# Patient Record
Sex: Female | Born: 1985 | Race: Black or African American | Hispanic: No | Marital: Single | Smoking: Former smoker
Health system: Southern US, Community
[De-identification: ages and names within clinical notes are randomized; demographics above are authoritative.]

## PROBLEM LIST (undated history)

## (undated) DIAGNOSIS — J45909 Unspecified asthma, uncomplicated: Secondary | ICD-10-CM

## (undated) DIAGNOSIS — S72001A Fracture of unspecified part of neck of right femur, initial encounter for closed fracture: Secondary | ICD-10-CM

## (undated) HISTORY — PX: NO PAST SURGERIES: SHX2092

---

## 2008-04-05 DIAGNOSIS — S72001A Fracture of unspecified part of neck of right femur, initial encounter for closed fracture: Secondary | ICD-10-CM

## 2008-04-05 HISTORY — DX: Fracture of unspecified part of neck of right femur, initial encounter for closed fracture: S72.001A

## 2012-05-01 ENCOUNTER — Encounter (HOSPITAL_COMMUNITY): Payer: Self-pay | Admitting: *Deleted

## 2012-05-01 ENCOUNTER — Emergency Department (HOSPITAL_COMMUNITY)
Admission: EM | Admit: 2012-05-01 | Discharge: 2012-05-01 | Disposition: A | Discharge status: Home / Self Care | Payer: Non-veteran care | Attending: Emergency Medicine | Admitting: Emergency Medicine

## 2012-05-01 DIAGNOSIS — M2550 Pain in unspecified joint: Secondary | ICD-10-CM | POA: Insufficient documentation

## 2012-05-01 DIAGNOSIS — S79919A Unspecified injury of unspecified hip, initial encounter: Secondary | ICD-10-CM | POA: Insufficient documentation

## 2012-05-01 DIAGNOSIS — J45909 Unspecified asthma, uncomplicated: Secondary | ICD-10-CM | POA: Insufficient documentation

## 2012-05-01 DIAGNOSIS — Z87891 Personal history of nicotine dependence: Secondary | ICD-10-CM | POA: Insufficient documentation

## 2012-05-01 DIAGNOSIS — Y9389 Activity, other specified: Secondary | ICD-10-CM | POA: Insufficient documentation

## 2012-05-01 DIAGNOSIS — M7918 Myalgia, other site: Secondary | ICD-10-CM

## 2012-05-01 HISTORY — DX: Unspecified asthma, uncomplicated: J45.909

## 2012-05-01 MED ORDER — IBUPROFEN 400 MG PO TABS
400.0000 mg | ORAL_TABLET | Freq: Once | ORAL | Status: AC
Start: 2012-05-01 — End: 2012-05-01
  Administered 2012-05-01: 400 mg via ORAL
  Filled 2012-05-01: qty 1

## 2012-05-01 NOTE — ED Notes (Signed)
Pt  Waiting in ED waiting room for cab voucher to assist in getting the pt & pt partner's children home
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
## 2012-05-01 NOTE — ED Provider Notes (Signed)
Medical screening examination/treatment/procedure(s) were performed by non-physician practitioner and as supervising physician I was immediately available for consultation/collaboration.   Rhealynn Myhre III, MD 05/01/12 2021 
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
## 2012-12-23 ENCOUNTER — Emergency Department (HOSPITAL_COMMUNITY): Payer: Medicare Other

## 2012-12-23 ENCOUNTER — Encounter (HOSPITAL_COMMUNITY): Payer: Self-pay | Admitting: Emergency Medicine

## 2012-12-23 ENCOUNTER — Inpatient Hospital Stay (HOSPITAL_COMMUNITY)
Admission: EM | Admit: 2012-12-23 | Discharge: 2012-12-25 | DRG: 392 | Disposition: A | Discharge status: Home / Self Care | Payer: Medicare Other | Attending: Internal Medicine | Admitting: Internal Medicine

## 2012-12-23 DIAGNOSIS — E860 Dehydration: Secondary | ICD-10-CM

## 2012-12-23 DIAGNOSIS — R197 Diarrhea, unspecified: Secondary | ICD-10-CM

## 2012-12-23 DIAGNOSIS — J45909 Unspecified asthma, uncomplicated: Secondary | ICD-10-CM | POA: Diagnosis present

## 2012-12-23 DIAGNOSIS — A09 Infectious gastroenteritis and colitis, unspecified: Principal | ICD-10-CM | POA: Diagnosis present

## 2012-12-23 DIAGNOSIS — Z23 Encounter for immunization: Secondary | ICD-10-CM

## 2012-12-23 DIAGNOSIS — R112 Nausea with vomiting, unspecified: Secondary | ICD-10-CM | POA: Diagnosis present

## 2012-12-23 DIAGNOSIS — K5289 Other specified noninfective gastroenteritis and colitis: Secondary | ICD-10-CM

## 2012-12-23 DIAGNOSIS — R109 Unspecified abdominal pain: Secondary | ICD-10-CM

## 2012-12-23 DIAGNOSIS — K529 Noninfective gastroenteritis and colitis, unspecified: Secondary | ICD-10-CM

## 2012-12-23 DIAGNOSIS — Z87891 Personal history of nicotine dependence: Secondary | ICD-10-CM

## 2012-12-23 DIAGNOSIS — D72829 Elevated white blood cell count, unspecified: Secondary | ICD-10-CM

## 2012-12-23 HISTORY — DX: Fracture of unspecified part of neck of right femur, initial encounter for closed fracture: S72.001A

## 2012-12-23 HISTORY — DX: Nausea with vomiting, unspecified: R11.2

## 2012-12-23 HISTORY — DX: Diarrhea, unspecified: R19.7

## 2012-12-23 LAB — COMPREHENSIVE METABOLIC PANEL
ALT: 10 U/L (ref 0–35)
AST: 15 U/L (ref 0–37)
Albumin: 3.8 g/dL (ref 3.5–5.2)
Chloride: 105 mEq/L (ref 96–112)
Creatinine, Ser: 0.91 mg/dL (ref 0.50–1.10)
Sodium: 142 mEq/L (ref 135–145)
Total Bilirubin: 0.1 mg/dL — ABNORMAL LOW (ref 0.3–1.2)

## 2012-12-23 LAB — CBC WITH DIFFERENTIAL/PLATELET
Basophils Absolute: 0 10*3/uL (ref 0.0–0.1)
Basophils Relative: 0 % (ref 0–1)
HCT: 43.3 % (ref 36.0–46.0)
MCHC: 35.6 g/dL (ref 30.0–36.0)
Monocytes Absolute: 0.5 10*3/uL (ref 0.1–1.0)
Neutro Abs: 21.4 10*3/uL — ABNORMAL HIGH (ref 1.7–7.7)
Neutrophils Relative %: 90 % — ABNORMAL HIGH (ref 43–77)
Platelets: 304 10*3/uL (ref 150–400)
RDW: 12.4 % (ref 11.5–15.5)
WBC: 23.7 10*3/uL — ABNORMAL HIGH (ref 4.0–10.5)

## 2012-12-23 LAB — LIPASE, BLOOD: Lipase: 16 U/L (ref 11–59)

## 2012-12-23 LAB — URINALYSIS, ROUTINE W REFLEX MICROSCOPIC
Glucose, UA: NEGATIVE mg/dL
Hgb urine dipstick: NEGATIVE
Specific Gravity, Urine: 1.019 (ref 1.005–1.030)

## 2012-12-23 LAB — PREGNANCY, URINE: Preg Test, Ur: NEGATIVE

## 2012-12-23 MED ORDER — HYDROMORPHONE HCL PF 1 MG/ML IJ SOLN: 0.5000 mg | INTRAMUSCULAR | Status: DC | PRN

## 2012-12-23 MED ORDER — HYDROMORPHONE HCL PF 1 MG/ML IJ SOLN
0.5000 mg | Freq: Once | INTRAMUSCULAR | Status: AC
  Administered 2012-12-23: 0.5 mg via INTRAVENOUS
  Filled 2012-12-23: qty 1

## 2012-12-23 MED ORDER — SODIUM CHLORIDE 0.9 % IV BOLUS (SEPSIS)
1000.0000 mL | Freq: Once | INTRAVENOUS | Status: AC
  Administered 2012-12-23: 1000 mL via INTRAVENOUS

## 2012-12-23 MED ORDER — METRONIDAZOLE IN NACL 5-0.79 MG/ML-% IV SOLN
500.0000 mg | Freq: Once | INTRAVENOUS | Status: AC
  Administered 2012-12-23: 500 mg via INTRAVENOUS
  Filled 2012-12-23: qty 100

## 2012-12-23 MED ORDER — IOHEXOL 300 MG/ML  SOLN: 25.0000 mL | INTRAMUSCULAR | Status: DC | PRN

## 2012-12-23 MED ORDER — METRONIDAZOLE IN NACL 5-0.79 MG/ML-% IV SOLN
500.0000 mg | Freq: Three times a day (TID) | INTRAVENOUS | Status: DC
  Administered 2012-12-24 – 2012-12-25 (×4): 500 mg via INTRAVENOUS
  Filled 2012-12-23 (×6): qty 100

## 2012-12-23 MED ORDER — HYDROMORPHONE HCL PF 1 MG/ML IJ SOLN
1.0000 mg | Freq: Once | INTRAMUSCULAR | Status: AC
  Administered 2012-12-23: 1 mg via INTRAVENOUS
  Filled 2012-12-23: qty 1

## 2012-12-23 MED ORDER — ONDANSETRON HCL 4 MG/2ML IJ SOLN
4.0000 mg | Freq: Four times a day (QID) | INTRAMUSCULAR | Status: DC | PRN
  Administered 2012-12-24 (×3): 4 mg via INTRAVENOUS
  Filled 2012-12-23 (×3): qty 2

## 2012-12-23 MED ORDER — CIPROFLOXACIN IN D5W 400 MG/200ML IV SOLN
400.0000 mg | Freq: Two times a day (BID) | INTRAVENOUS | Status: DC
  Administered 2012-12-24 (×2): 400 mg via INTRAVENOUS
  Filled 2012-12-23 (×4): qty 200

## 2012-12-23 MED ORDER — KETOROLAC TROMETHAMINE 30 MG/ML IJ SOLN
30.0000 mg | Freq: Once | INTRAMUSCULAR | Status: AC
  Administered 2012-12-23: 30 mg via INTRAVENOUS
  Filled 2012-12-23: qty 1

## 2012-12-23 MED ORDER — SODIUM CHLORIDE 0.9 % IV SOLN: INTRAVENOUS | Status: DC

## 2012-12-23 MED ORDER — ONDANSETRON HCL 4 MG/2ML IJ SOLN: 4.0000 mg | Freq: Three times a day (TID) | INTRAMUSCULAR | Status: DC | PRN

## 2012-12-23 MED ORDER — ONDANSETRON HCL 4 MG/2ML IJ SOLN
4.0000 mg | Freq: Once | INTRAMUSCULAR | Status: AC
  Administered 2012-12-23: 4 mg via INTRAVENOUS
  Filled 2012-12-23: qty 2

## 2012-12-23 MED ORDER — IOHEXOL 300 MG/ML  SOLN
100.0000 mL | Freq: Once | INTRAMUSCULAR | Status: AC | PRN
  Administered 2012-12-23: 100 mL via INTRAVENOUS

## 2012-12-23 MED ORDER — HYDROMORPHONE HCL PF 1 MG/ML IJ SOLN
0.5000 mg | INTRAMUSCULAR | Status: DC | PRN
  Administered 2012-12-24 (×3): 0.5 mg via INTRAVENOUS
  Filled 2012-12-23 (×3): qty 1

## 2012-12-23 MED ORDER — SODIUM CHLORIDE 0.9 % IV SOLN
INTRAVENOUS | Status: AC
  Administered 2012-12-24: via INTRAVENOUS

## 2012-12-23 MED ORDER — LEVOFLOXACIN IN D5W 750 MG/150ML IV SOLN
750.0000 mg | Freq: Once | INTRAVENOUS | Status: AC
  Administered 2012-12-23: 750 mg via INTRAVENOUS
  Filled 2012-12-23: qty 150

## 2012-12-23 NOTE — ED Notes (Signed)
Friend stated, she started having stomach pain with nausea vomiting for 2 days.

## 2012-12-23 NOTE — ED Notes (Signed)
Called CT to notify that pt is finished with contrast.

## 2012-12-23 NOTE — ED Provider Notes (Signed)
CSN: [id number]     Arrival date & time 12/23/12  1355 History   First MD Initiated Contact with Patient 12/23/12 1628     Chief Complaint  Patient presents with  . Abdominal Pain  . Nausea   (Consider location/radiation/quality/duration/timing/severity/associated sxs/prior Treatment) HPI Comments: 27 yo female with no medical hx, past smoker, no illegal drugs, no sick contacts presents with epig pain and recurrent vomiting, non bloody for two days. No hx of similar.  No DM hx.  Pt drinking increased water.  General weakness. Nothing improves.  No abd surgery hx.  No radiation of pain, worse with vomiting. Currently on menstrual cycle.   Patient is a 27 y.o. female presenting with abdominal pain. The history is provided by the patient.  Abdominal Pain Pain location:  Epigastric Associated symptoms: fatigue, nausea and vomiting   Associated symptoms: no chest pain, no chills, no dysuria, no fever and no shortness of breath     Past Medical History  Diagnosis Date  . Asthma    History reviewed. No pertinent past surgical history. No family history on file. History  Substance Use Topics  . Smoking status: Former Smoker    Quit date: 04/05/2010  . Smokeless tobacco: Not on file  . Alcohol Use: No   OB History   Grav Para Term Preterm Abortions TAB SAB Ect Mult Living                 Review of Systems  Constitutional: Positive for appetite change and fatigue. Negative for fever and chills.  HENT: Negative for neck pain and neck stiffness.   Eyes: Negative for visual disturbance.  Respiratory: Negative for shortness of breath.   Cardiovascular: Negative for chest pain.  Gastrointestinal: Positive for nausea, vomiting and abdominal pain. Negative for blood in stool.  Genitourinary: Negative for dysuria and flank pain.  Musculoskeletal: Negative for back pain.  Skin: Negative for rash.  Neurological: Positive for light-headedness. Negative for headaches.    Allergies  Review  of patient's allergies indicates no known allergies.  Home Medications  No current outpatient prescriptions on file. BP 131/88  Pulse 84  Temp(Src) 97.7 F (36.5 C) (Oral)  Resp 15  SpO2 100%  LMP 12/22/2012 Physical Exam  Nursing note and vitals reviewed. Constitutional: She is oriented to person, place, and time. She appears well-developed and well-nourished.  HENT:  Head: Normocephalic and atraumatic.  Very dry mm  Eyes: Conjunctivae are normal. Right eye exhibits no discharge. Left eye exhibits no discharge.  Neck: Normal range of motion. Neck supple. No tracheal deviation present.  Cardiovascular: Normal rate and regular rhythm.   Pulmonary/Chest: Effort normal and breath sounds normal.  Abdominal: Soft. She exhibits no distension. There is tenderness (epig mild). There is no guarding.  Musculoskeletal: She exhibits no edema.  Neurological: She is alert and oriented to person, place, and time. No cranial nerve deficit. GCS eye subscore is 4. GCS verbal subscore is 5. GCS motor subscore is 6.  gen weakness Neck supple/ full rom/ no meningismus  Skin: Skin is warm. No rash noted.  Psychiatric: She has a normal mood and affect.    ED Course  Procedures (including critical care time) Emergency Ultrasound Study:    EMERGENCY DEPARTMENT BILIARY ULTRASOUND INTERPRETATION "Study: Limited Abdominal Ultrasound of the gallbladder and common bile duct."  INDICATIONS: Abdominal pain, Nausea and Vomiting Indication: Multiple views of the gallbladder and common bile duct were obtained in real-time with a Multi-frequency probe." PERFORMED BY:  Myself IMAGES  ARCHIVED?: Yes FINDINGS: Gallstones absent, Gallbladder wall normal in thickness, Sonographic Murphy's sign absent and Common bile duct normal in size LIMITATIONS: Bowel Gas INTERPRETATION: Normal    Angiocath insertion Performed by: Enid Skeens  Consent: Verbal consent obtained. Risks and benefits: risks, benefits and  alternatives were discussed Immediately prior to procedure the correct patient, procedure, equipment, support staff and site/side marked as needed.  Indication: difficult IV access Preparation: Patient was prepped and draped in the usual sterile fashion. Vein Location: right basilic vein was visualized during assessment for potential access sites and was found to be patent/ easily compressed with linear ultrasound.  The needle was visualized with real-time ultrasound and guided into the vein. Gauge: 20 g  Image saved and stored.  Normal blood return.  Patient tolerance: Patient tolerated the procedure well with no immediate complications.     Labs Review Labs Reviewed  CBC WITH DIFFERENTIAL - Abnormal; Notable for the following:    WBC 23.7 (*)    Hemoglobin 15.4 (*)    Neutrophils Relative % 90 (*)    Neutro Abs 21.4 (*)    Lymphocytes Relative 8 (*)    Monocytes Relative 2 (*)    All other components within normal limits  COMPREHENSIVE METABOLIC PANEL - Abnormal; Notable for the following:    Glucose, Bld 145 (*)    Total Bilirubin 0.1 (*)    GFR calc non Af Amer 87 (*)    All other components within normal limits  URINALYSIS, ROUTINE W REFLEX MICROSCOPIC - Abnormal; Notable for the following:    APPearance CLOUDY (*)    All other components within normal limits  LIPASE, BLOOD  PREGNANCY, URINE   Imaging Review No results found.  MDM  No diagnosis found. Delay in waiting room. Difficult IV. Personally placed [place] IV.  2 fluid boluses and labs. Clinically ruptured appy vs biliary vs gastritis w dehydration.   WBC elevated, CT abd and cxr to look for source. Bedside US showed unremarkable GB, CT abd pelvis ordered to look for appendicitis/ rupture Pain meds given. Pt improved on recheck.  CT showed colitis. CXR reviewed, no acute findings. Pt requiring multiple fluid boluses, pain meds. Discussed close outpt fup vs observation, pt prefers observation. Spoke with Dr  Julian Reil, okay with plan.  Abx given.  Dehydration, Abd pain, Vomiting, Colitis  Dg Chest 2 View  12/23/2012   CLINICAL DATA:  Pain with nausea and vomiting  EXAM: CHEST  2 VIEW  COMPARISON:  None.  FINDINGS: Lungs are clear. Heart size and pulmonary vascularity are normal. No adenopathy. No pneumothorax. No bone lesions. .  IMPRESSION: No abnormality noted.   Electronically Signed   By: Bretta Bang   On: 12/23/2012 18:35   Ct Abdomen Pelvis W Contrast  12/23/2012   CLINICAL DATA:  Abdominal pain with nausea and vomiting  EXAM: CT ABDOMEN AND PELVIS WITH CONTRAST  TECHNIQUE: Multidetector CT imaging of the abdomen and pelvis was performed using the standard protocol following bolus administration of intravenous contrast. Oral contrast was also administered.  CONTRAST:  OMNIPAQUE IOHEXOL 300 MG/ML  SOLN  COMPARISON:  None.  FINDINGS: Lung bases are clear.  No focal liver lesions are identified. There is no biliary duct dilatation.  Spleen, pancreas, and adrenals appear normal. Kidneys bilaterally show no appreciable mass or hydronephrosis on either side.  In the pelvis, there is no mass or fluid collection. Appendix appears normal.  There is no bowel obstruction. There is no free air or  portal venous air. There is no ascites, adenopathy, or abscess in the abdomen or pelvis.  There is some fold thickening in the distal descending and proximal sigmoid colon regions, felt to represent a degree of colitis. There is no surrounding mesenteric inflammation, however.  Aorta is nonaneurysmal. There are no blastic or lytic bone lesions.  IMPRESSION: The distal descending and proximal sigmoid colon colitis. No abscess or mesenteric inflammation. Appendix appears normal. No bowel obstruction. Study otherwise unremarkable.   Electronically Signed   By: Bretta Bang   On: 12/23/2012 20:23      Enid Skeens, MD 12/23/12 2057

## 2012-12-23 NOTE — H&P (Signed)
[facility] History and Physical  [name] XBJ:[id number] DOB: January 24, 1986 DOA: 12/23/2012  Referring physician: ED PCP: Default, Provider, MD   Chief Complaint: N/V/D  HPI: Brittney Khan is a 27 y.o. female who presents with a 2 day history of N/V/D, associated with crampy epigastric pain.  Vomit is NBNB, no melena nor BRBPR.  Vomiting makes abd pain worse, nothing makes symptoms better.  Work up in the ED includes a CT scan which demonstrates colitis, and a WBC of 23k.  Patient was put on emperic ABx and hospitalist asked to admit for obs since her vomiting could not be adequately controlled for discharge.  Review of Systems: 12 systems reviewed and otherwise negative.  Past Medical History  Diagnosis Date  . Asthma    History reviewed. No pertinent past surgical history. Social History:  reports that she quit smoking about 2 years ago. She does not have any smokeless tobacco history on file. She reports that she does not drink alcohol or use illicit drugs.   No Known Allergies  No family history on file.  Prior to Admission medications   Not on File   Physical Exam: Filed Vitals:   12/23/12 2145  BP: 119/62  Pulse: 78  Temp:   Resp:     General:  NAD, resting comfortably in bed Eyes: PEERLA EOMI ENT: mucous membranes moist Neck: supple w/o JVD Cardiovascular: RRR w/o MRG Respiratory: CTA B Abdomen: soft, nt, nd, bs+ Skin: no rash nor lesion Musculoskeletal: MAE, full ROM all 4 extremities Psychiatric: normal tone and affect Neurologic: AAOx3, grossly non-focal  Labs on Admission:  Basic Metabolic Panel:  Recent Labs Lab 12/23/12 1639  NA 142  K 4.1  CL 105  CO2 29  GLUCOSE 145*  BUN 13  CREATININE 0.91  CALCIUM 9.0   Liver Function Tests:  Recent Labs Lab 12/23/12 1639  AST 15  ALT 10  ALKPHOS 71  BILITOT 0.1*  PROT 6.2  ALBUMIN 3.8    Recent Labs Lab 12/23/12 1639  LIPASE 16   No results found for this basename:  AMMONIA,  in the last 168 hours CBC:  Recent Labs Lab 12/23/12 1639  WBC 23.7*  NEUTROABS 21.4*  HGB 15.4*  HCT 43.3  MCV 91.5  PLT 304   Cardiac Enzymes: No results found for this basename: CKTOTAL, CKMB, CKMBINDEX, TROPONINI,  in the last 168 hours  BNP (last 3 results) No results found for this basename: PROBNP,  in the last 8760 hours CBG: No results found for this basename: GLUCAP,  in the last 168 hours  Radiological Exams on Admission: Dg Chest 2 View  12/23/2012   CLINICAL DATA:  Pain with nausea and vomiting  EXAM: CHEST  2 VIEW  COMPARISON:  None.  FINDINGS: Lungs are clear. Heart size and pulmonary vascularity are normal. No adenopathy. No pneumothorax. No bone lesions. .  IMPRESSION: No abnormality noted.   Electronically Signed   By: Bretta Bang   On: 12/23/2012 18:35   Ct Abdomen Pelvis W Contrast  12/23/2012   CLINICAL DATA:  Abdominal pain with nausea and vomiting  EXAM: CT ABDOMEN AND PELVIS WITH CONTRAST  TECHNIQUE: Multidetector CT imaging of the abdomen and pelvis was performed using the standard protocol following bolus administration of intravenous contrast. Oral contrast was also administered.  CONTRAST:  OMNIPAQUE IOHEXOL 300 MG/ML  SOLN  COMPARISON:  None.  FINDINGS: Lung bases are clear.  No focal liver lesions are identified. There is no biliary duct dilatation.  Spleen, pancreas, and adrenals appear normal. Kidneys bilaterally show no appreciable mass or hydronephrosis on either side.  In the pelvis, there is no mass or fluid collection. Appendix appears normal.  There is no bowel obstruction. There is no free air or portal venous air. There is no ascites, adenopathy, or abscess in the abdomen or pelvis.  There is some fold thickening in the distal descending and proximal sigmoid colon regions, felt to represent a degree of colitis. There is no surrounding mesenteric inflammation, however.  Aorta is nonaneurysmal. There are no blastic or lytic bone  lesions.  IMPRESSION: The distal descending and proximal sigmoid colon colitis. No abscess or mesenteric inflammation. Appendix appears normal. No bowel obstruction. Study otherwise unremarkable.   Electronically Signed   By: Bretta Bang   On: 12/23/2012 20:23    EKG: Independently reviewed.  Assessment/Plan Principal Problem:   Infectious colitis Active Problems:   Nausea vomiting and diarrhea   1. Infectious colitis - causing N/V/D, will treat empirically with cipro/flagyl given she also has leukocytosis of 23k.  Nausea control with zofran, IVF for hydration, supportive care and admitting patient for observation.    Code Status: Full (must indicate code status--if unknown or must be presumed, indicate so) Family Communication: No family in room (indicate person spoken with, if applicable, with phone number if by telephone) Disposition Plan: Admit to obs (indicate anticipated LOS)  Time spent: 50 min  [name] M. [facility] Pager [phone]  If 7PM-7AM, please contact night-coverage [email or web address] Password [email or web address] 12/23/2012, 10:20 PM

## 2012-12-23 NOTE — Progress Notes (Deleted)
Patient ID: Brittney Khan, female   DOB: 1985/12/07, 27 y.o.   MRN: [id number] Myovue shows no infarct or ischemia.  EF 56% Discussed with Dr Shirline Frees to d/c and proceed with surgery on [place]  Charlton Haws

## 2012-12-24 ENCOUNTER — Encounter (HOSPITAL_COMMUNITY): Payer: Self-pay | Admitting: *Deleted

## 2012-12-24 DIAGNOSIS — D72829 Elevated white blood cell count, unspecified: Secondary | ICD-10-CM

## 2012-12-24 LAB — CBC
HCT: 35.2 % — ABNORMAL LOW (ref 36.0–46.0)
MCH: 31.6 pg (ref 26.0–34.0)
MCHC: 34.7 g/dL (ref 30.0–36.0)
MCV: 91.2 fL (ref 78.0–100.0)
Platelets: 242 10*3/uL (ref 150–400)
RBC: 3.86 MIL/uL — ABNORMAL LOW (ref 3.87–5.11)
RDW: 12.8 % (ref 11.5–15.5)

## 2012-12-24 LAB — BASIC METABOLIC PANEL
BUN: 6 mg/dL (ref 6–23)
Calcium: 8.3 mg/dL — ABNORMAL LOW (ref 8.4–10.5)
GFR calc Af Amer: >90 mL/min (ref >90)
GFR calc non Af Amer: >90 mL/min (ref >90)
Sodium: 136 mEq/L (ref 135–145)

## 2012-12-24 MED ORDER — PNEUMOCOCCAL VAC POLYVALENT 25 MCG/0.5ML IJ INJ
0.5000 mL | INJECTION | INTRAMUSCULAR | Status: AC
  Administered 2012-12-25: 0.5 mL via INTRAMUSCULAR
  Filled 2012-12-24: qty 0.5

## 2012-12-24 MED ORDER — INFLUENZA VAC SPLIT QUAD 0.5 ML IM SUSP
0.5000 mL | INTRAMUSCULAR | Status: AC
  Administered 2012-12-25: 0.5 mL via INTRAMUSCULAR
  Filled 2012-12-24: qty 0.5

## 2012-12-24 NOTE — Progress Notes (Signed)
[facility] PROGRESS NOTE  Colbi Schiltz ZOX:[id number] DOB: March 10, 1986 DOA: 12/23/2012 PCP: Default, Provider, MD  Assessment/Plan: Infectious colitis - causing N/V/D, will treat empirically with cipro/flagyl given she also has leukocytosis of 23k. Nausea control with zofran, IVF for hydration, supportive care and admitting patient for observation. Advance diet as tolerated- started full liquid for now   Code Status: full Family Communication: patient Disposition Plan:    Consultants:    Procedures:    Antibiotics:  cipro/flagyl  HPI/Subjective: Feeling some better Thinks she may be able to tolerate food  Objective: Filed Vitals:   12/24/12 0714  BP: 117/60  Pulse: 63  Temp: 97.8 F (36.6 C)  Resp: 16   No intake or output data in the 24 hours ending 12/24/12 1049 Filed Weights   12/23/12 1919 12/24/12 0107  Weight: 77.111 kg (170 lb) 77.111 kg (170 lb)    Exam:   General:  uncomfortable appearing  Cardiovascular: rrr  Respiratory: clear anterior  Abdomen: +BS, soft, NT  Musculoskeletal: moves all 4 ext   Data Reviewed: Basic Metabolic Panel:  Recent Labs Lab 12/23/12 1639 12/24/12 0500  NA 142 136  K 4.1 3.5  CL 105 104  CO2 29 26  GLUCOSE 145* 91  BUN 13 6  CREATININE 0.91 0.86  CALCIUM 9.0 8.3*   Liver Function Tests:  Recent Labs Lab 12/23/12 1639  AST 15  ALT 10  ALKPHOS 71  BILITOT 0.1*  PROT 6.2  ALBUMIN 3.8    Recent Labs Lab 12/23/12 1639  LIPASE 16   No results found for this basename: AMMONIA,  in the last 168 hours CBC:  Recent Labs Lab 12/23/12 1639 12/24/12 0500  WBC 23.7* 15.2*  NEUTROABS 21.4*  --   HGB 15.4* 12.2  HCT 43.3 35.2*  MCV 91.5 91.2  PLT 304 242   Cardiac Enzymes: No results found for this basename: CKTOTAL, CKMB, CKMBINDEX, TROPONINI,  in the last 168 hours BNP (last 3 results) No results found for this basename: PROBNP,  in the last 8760 hours CBG: No results found  for this basename: GLUCAP,  in the last 168 hours  No results found for this or any previous visit (from the past 240 hour(s)).   Studies: Dg Chest 2 View  12/23/2012   CLINICAL DATA:  Pain with nausea and vomiting  EXAM: CHEST  2 VIEW  COMPARISON:  None.  FINDINGS: Lungs are clear. Heart size and pulmonary vascularity are normal. No adenopathy. No pneumothorax. No bone lesions. .  IMPRESSION: No abnormality noted.   Electronically Signed   By: Bretta Bang   On: 12/23/2012 18:35   Ct Abdomen Pelvis W Contrast  12/23/2012   CLINICAL DATA:  Abdominal pain with nausea and vomiting  EXAM: CT ABDOMEN AND PELVIS WITH CONTRAST  TECHNIQUE: Multidetector CT imaging of the abdomen and pelvis was performed using the standard protocol following bolus administration of intravenous contrast. Oral contrast was also administered.  CONTRAST:  OMNIPAQUE IOHEXOL 300 MG/ML  SOLN  COMPARISON:  None.  FINDINGS: Lung bases are clear.  No focal liver lesions are identified. There is no biliary duct dilatation.  Spleen, pancreas, and adrenals appear normal. Kidneys bilaterally show no appreciable mass or hydronephrosis on either side.  In the pelvis, there is no mass or fluid collection. Appendix appears normal.  There is no bowel obstruction. There is no free air or portal venous air. There is no ascites, adenopathy, or abscess in the abdomen or pelvis.  There  is some fold thickening in the distal descending and proximal sigmoid colon regions, felt to represent a degree of colitis. There is no surrounding mesenteric inflammation, however.  Aorta is nonaneurysmal. There are no blastic or lytic bone lesions.  IMPRESSION: The distal descending and proximal sigmoid colon colitis. No abscess or mesenteric inflammation. Appendix appears normal. No bowel obstruction. Study otherwise unremarkable.   Electronically Signed   By: Bretta Bang   On: 12/23/2012 20:23    Scheduled Meds: . ciprofloxacin  400 mg Intravenous  Q12H  . [START ON 12/25/2012] influenza vac split quadrivalent PF  0.5 mL Intramuscular Tomorrow-1000  . metronidazole  500 mg Intravenous Q8H  . [START ON 12/25/2012] pneumococcal 23 valent vaccine  0.5 mL Intramuscular Tomorrow-1000   Continuous Infusions:   Principal Problem:   Infectious colitis Active Problems:   Nausea vomiting and diarrhea    Time spent: 58    [facility] Geet Hosking  [facility] Pager [phone] If 7PM-7AM, please contact night-coverage at [email or web address], password [facility] 12/24/2012, 10:49 AM  LOS: 1 day

## 2012-12-25 ENCOUNTER — Ambulatory Visit: Payer: Medicare Other | Admitting: Physical Therapy

## 2012-12-25 DIAGNOSIS — E860 Dehydration: Secondary | ICD-10-CM

## 2012-12-25 LAB — CBC
HCT: 35.9 % — ABNORMAL LOW (ref 36.0–46.0)
MCH: 31.8 pg (ref 26.0–34.0)
MCHC: 35.1 g/dL (ref 30.0–36.0)
MCV: 90.7 fL (ref 78.0–100.0)
RBC: 3.96 MIL/uL (ref 3.87–5.11)
RDW: 12.2 % (ref 11.5–15.5)
WBC: 8 10*3/uL (ref 4.0–10.5)

## 2012-12-25 LAB — BASIC METABOLIC PANEL
BUN: 8 mg/dL (ref 6–23)
CO2: 27 mEq/L (ref 19–32)
Creatinine, Ser: 1.04 mg/dL (ref 0.50–1.10)
GFR calc non Af Amer: 74 mL/min — ABNORMAL LOW (ref >90)
Glucose, Bld: 80 mg/dL (ref 70–99)
Potassium: 3.3 mEq/L — ABNORMAL LOW (ref 3.5–5.1)

## 2012-12-25 MED ORDER — CIPROFLOXACIN HCL 500 MG PO TABS: 500.0000 mg | ORAL_TABLET | Freq: Two times a day (BID) | ORAL | Status: DC

## 2012-12-25 MED ORDER — METRONIDAZOLE 500 MG PO TABS
500.0000 mg | ORAL_TABLET | Freq: Three times a day (TID) | ORAL | Status: DC
  Administered 2012-12-25: 500 mg via ORAL
  Filled 2012-12-25: qty 1

## 2012-12-25 MED ORDER — POTASSIUM CHLORIDE CRYS ER 20 MEQ PO TBCR
40.0000 meq | EXTENDED_RELEASE_TABLET | Freq: Once | ORAL | Status: AC
  Administered 2012-12-25: 40 meq via ORAL
  Filled 2012-12-25: qty 2

## 2012-12-25 MED ORDER — METRONIDAZOLE 500 MG PO TABS: 500.0000 mg | ORAL_TABLET | Freq: Three times a day (TID) | ORAL | Status: DC

## 2012-12-25 MED ORDER — CIPROFLOXACIN HCL 500 MG PO TABS
500.0000 mg | ORAL_TABLET | Freq: Two times a day (BID) | ORAL | Status: DC
  Administered 2012-12-25: 500 mg via ORAL
  Filled 2012-12-25: qty 1

## 2012-12-25 NOTE — Care Management Note (Signed)
  Page 1 of 1   12/25/2012     3:03:37 PM   CARE MANAGEMENT NOTE 12/25/2012  Patient:  MELVIE, PAGLIA   Account Number:  [id number]  Date Initiated:  12/25/2012  Documentation initiated by:  Ronny Flurry  Subjective/Objective Assessment:     Action/Plan:   Anticipated DC Date:  12/25/2012   Anticipated DC Plan:           Choice offered to / List presented to:             Status of service:   Medicare Important Message given?   (If response is "NO", the following Medicare IM given date fields will be blank) Date Medicare IM given:   Date Additional Medicare IM given:    Discharge Disposition:    Per UR Regulation:    If discussed at Long Length of Stay Meetings, dates discussed:    Comments:  12-25-12 patient discharged when received consult for follow up at [facility] and [facility] .   [facility] will call patinet with appointment .  Ronny Flurry RN BSN

## 2012-12-25 NOTE — Discharge Summary (Signed)
Physician Discharge Summary  [name] NWG:[id number] DOB: Aug 09, 1985 DOA: 12/23/2012  PCP: Default, Provider, MD  Admit date: 12/23/2012 Discharge date: 12/25/2012  Time spent: 35 minutes  Recommendations for Outpatient Follow-up:  1. PCP- to assure colitis has resolved  Discharge Diagnoses:  Principal Problem:   Infectious colitis Active Problems:   Nausea vomiting and diarrhea   Discharge Condition: improved  Diet recommendation: as tolerated  Filed Weights   12/23/12 1919 12/24/12 0107  Weight: 77.111 kg (170 lb) 77.111 kg (170 lb)    History of present illness:  Brittney Khan is a 27 y.o. female who presents with a 2 day history of N/V/D, associated with crampy epigastric pain. Vomit is NBNB, no melena nor BRBPR. Vomiting makes abd pain worse, nothing makes symptoms better.  Work up in the ED includes a CT scan which demonstrates colitis, and a WBC of 23k. Patient was put on emperic ABx and hospitalist asked to admit for obs since her vomiting could not be adequately controlled for discharge   Hospital Course:  Infectious colitis - causing N/V/D,  Cipro/flagyl; WBC normal now. Eating regular meals  Procedures:  none  Consultations:  none  Discharge Exam: Filed Vitals:   12/25/12 0500  BP: 110/67  Pulse: 61  Temp: 97.6 F (36.4 C)  Resp: 17    General: A+Ox3, NAD Cardiovascular: rrr Respiratory: clear anterior  Discharge Instructions  Discharge Orders   Future Appointments Provider Department Dept Phone   01/04/2013 1:30 PM Stacie Glaze, PT [facility] [phone]   Future Orders Complete By Expires   Diet general  As directed    Increase activity slowly  As directed        Medication List         ciprofloxacin 500 MG tablet  Commonly known as:  CIPRO  Take 1 tablet (500 mg total) by mouth 2 (two) times daily.     metroNIDAZOLE 500 MG tablet  Commonly known as:  FLAGYL  Take 1 tablet (500 mg  total) by mouth every 8 (eight) hours.       No Known Allergies     Follow-up Information   Follow up with Default, Provider, MD. (can follow [facility] with [facility] for [facility]CP)    Contact information:   [address] [place] [address] [phone]        The results of significant diagnostics from this hospitalization (including imaging, microbiology, ancillary and laboratory) are listed below for reference.    Significant Diagnostic Studies: Dg Chest 2 View  12/23/2012   CLINICAL DATA:  Pain with nausea and vomiting  EXAM: CHEST  2 VIEW  COMPARISON:  None.  FINDINGS: Lungs are clear. Heart size and pulmonary vascularity are normal. No adenopathy. No pneumothorax. No bone lesions. .  IMPRESSION: No abnormality noted.   Electronically Signed   By: Bretta Bang   On: 12/23/2012 18:35   Ct Abdomen Pelvis W Contrast  12/23/2012   CLINICAL DATA:  Abdominal pain with nausea and vomiting  EXAM: CT ABDOMEN AND PELVIS WITH CONTRAST  TECHNIQUE: Multidetector CT imaging of the abdomen and pelvis was performed using the standard protocol following bolus administration of intravenous contrast. Oral contrast was also administered.  CONTRAST:  OMNIPAQUE IOHEXOL 300 MG/ML  SOLN  COMPARISON:  None.  FINDINGS: Lung bases are clear.  No focal liver lesions are identified. There is no biliary duct dilatation.  Spleen, pancreas, and adrenals appear normal. Kidneys bilaterally show no  appreciable mass or hydronephrosis on either side.  In the pelvis, there is no mass or fluid collection. Appendix appears normal.  There is no bowel obstruction. There is no free air or portal venous air. There is no ascites, adenopathy, or abscess in the abdomen or pelvis.  There is some fold thickening in the distal descending and proximal sigmoid colon regions, felt to represent a degree of colitis. There is no surrounding mesenteric inflammation, however.  Aorta is nonaneurysmal. There are no  blastic or lytic bone lesions.  IMPRESSION: The distal descending and proximal sigmoid colon colitis. No abscess or mesenteric inflammation. Appendix appears normal. No bowel obstruction. Study otherwise unremarkable.   Electronically Signed   By: Bretta Bang   On: 12/23/2012 20:23    Microbiology: No results found for this or any previous visit (from the past 240 hour(s)).   Labs: Basic Metabolic Panel:  Recent Labs Lab 12/23/12 1639 12/24/12 0500  NA 142 136  K 4.1 3.5  CL 105 104  CO2 29 26  GLUCOSE 145* 91  BUN 13 6  CREATININE 0.91 0.86  CALCIUM 9.0 8.3*   Liver Function Tests:  Recent Labs Lab 12/23/12 1639  AST 15  ALT 10  ALKPHOS 71  BILITOT 0.1*  PROT 6.2  ALBUMIN 3.8    Recent Labs Lab 12/23/12 1639  LIPASE 16   No results found for this basename: AMMONIA,  in the last 168 hours CBC:  Recent Labs Lab 12/23/12 1639 12/24/12 0500  WBC 23.7* 15.2*  NEUTROABS 21.4*  --   HGB 15.4* 12.2  HCT 43.3 35.2*  MCV 91.5 91.2  PLT 304 242   Cardiac Enzymes: No results found for this basename: CKTOTAL, CKMB, CKMBINDEX, TROPONINI,  in the last 168 hours BNP: BNP (last 3 results) No results found for this basename: PROBNP,  in the last 8760 hours CBG: No results found for this basename: GLUCAP,  in the last 168 hours     Signed:  Marlin Canary  [facility] 12/25/2012, 9:36 AM

## 2013-01-04 ENCOUNTER — Ambulatory Visit: Payer: Non-veteran care | Attending: Family Medicine | Admitting: Physical Therapy

## 2013-01-04 DIAGNOSIS — M545 Low back pain, unspecified: Secondary | ICD-10-CM | POA: Insufficient documentation

## 2013-01-04 DIAGNOSIS — M25559 Pain in unspecified hip: Secondary | ICD-10-CM | POA: Insufficient documentation

## 2013-01-04 DIAGNOSIS — IMO0001 Reserved for inherently not codable concepts without codable children: Secondary | ICD-10-CM | POA: Insufficient documentation

## 2013-01-08 ENCOUNTER — Inpatient Hospital Stay: Payer: Non-veteran care | Admitting: Internal Medicine

## 2013-01-10 ENCOUNTER — Ambulatory Visit: Payer: Non-veteran care | Admitting: Physical Therapy

## 2013-01-11 ENCOUNTER — Ambulatory Visit: Payer: Non-veteran care | Admitting: Physical Therapy

## 2013-01-24 ENCOUNTER — Ambulatory Visit: Payer: Non-veteran care | Admitting: Physical Therapy

## 2013-01-25 ENCOUNTER — Ambulatory Visit: Payer: Non-veteran care | Admitting: Physical Therapy

## 2013-01-29 ENCOUNTER — Ambulatory Visit: Payer: Non-veteran care | Admitting: Physical Therapy

## 2013-01-31 ENCOUNTER — Ambulatory Visit: Payer: Non-veteran care | Admitting: Physical Therapy

## 2013-02-07 ENCOUNTER — Ambulatory Visit: Payer: Non-veteran care | Admitting: Physical Therapy

## 2013-02-08 ENCOUNTER — Ambulatory Visit: Payer: Non-veteran care | Admitting: Physical Therapy

## 2013-02-14 ENCOUNTER — Ambulatory Visit: Payer: Non-veteran care | Admitting: Physical Therapy

## 2013-02-15 ENCOUNTER — Ambulatory Visit: Payer: Non-veteran care | Attending: Family Medicine | Admitting: Physical Therapy

## 2013-02-15 DIAGNOSIS — IMO0001 Reserved for inherently not codable concepts without codable children: Secondary | ICD-10-CM | POA: Insufficient documentation

## 2013-02-15 DIAGNOSIS — M545 Low back pain, unspecified: Secondary | ICD-10-CM | POA: Insufficient documentation

## 2013-02-15 DIAGNOSIS — M25559 Pain in unspecified hip: Secondary | ICD-10-CM | POA: Insufficient documentation

## 2013-02-21 ENCOUNTER — Ambulatory Visit: Payer: Non-veteran care | Admitting: Physical Therapy

## 2013-02-22 ENCOUNTER — Ambulatory Visit: Payer: Non-veteran care | Admitting: Physical Therapy

## 2013-02-28 ENCOUNTER — Ambulatory Visit: Payer: Non-veteran care | Admitting: Physical Therapy

## 2013-03-05 ENCOUNTER — Ambulatory Visit: Payer: Non-veteran care | Attending: Family Medicine | Admitting: Physical Therapy

## 2013-03-05 DIAGNOSIS — IMO0001 Reserved for inherently not codable concepts without codable children: Secondary | ICD-10-CM | POA: Insufficient documentation

## 2013-03-05 DIAGNOSIS — M25559 Pain in unspecified hip: Secondary | ICD-10-CM | POA: Insufficient documentation

## 2013-03-05 DIAGNOSIS — M545 Low back pain, unspecified: Secondary | ICD-10-CM | POA: Insufficient documentation

## 2013-06-28 ENCOUNTER — Encounter (HOSPITAL_COMMUNITY): Payer: Self-pay | Admitting: Emergency Medicine

## 2013-06-28 ENCOUNTER — Emergency Department (HOSPITAL_COMMUNITY)
Admission: EM | Admit: 2013-06-28 | Discharge: 2013-06-29 | Disposition: A | Discharge status: Home / Self Care | Payer: Medicare Other | Attending: Emergency Medicine | Admitting: Emergency Medicine

## 2013-06-28 DIAGNOSIS — J45901 Unspecified asthma with (acute) exacerbation: Secondary | ICD-10-CM | POA: Insufficient documentation

## 2013-06-28 DIAGNOSIS — Z79899 Other long term (current) drug therapy: Secondary | ICD-10-CM | POA: Insufficient documentation

## 2013-06-28 DIAGNOSIS — E876 Hypokalemia: Secondary | ICD-10-CM | POA: Insufficient documentation

## 2013-06-28 DIAGNOSIS — J40 Bronchitis, not specified as acute or chronic: Secondary | ICD-10-CM

## 2013-06-28 DIAGNOSIS — R0789 Other chest pain: Secondary | ICD-10-CM | POA: Insufficient documentation

## 2013-06-28 DIAGNOSIS — Z8781 Personal history of (healed) traumatic fracture: Secondary | ICD-10-CM | POA: Insufficient documentation

## 2013-06-28 DIAGNOSIS — Z87891 Personal history of nicotine dependence: Secondary | ICD-10-CM | POA: Insufficient documentation

## 2013-06-28 NOTE — ED Notes (Signed)
EMS called to home.  Found patient in bed with a breathing treatment.  Patient States that she  Has been having wheezing that started today.  Patient has a history of asthma.  She added that  She has had two breathing treatments this evening with some relief.

## 2013-06-29 ENCOUNTER — Emergency Department (HOSPITAL_COMMUNITY): Payer: Medicare Other

## 2013-06-29 LAB — CBC WITH DIFFERENTIAL/PLATELET
Basophils Absolute: 0 10*3/uL (ref 0.0–0.1)
Basophils Relative: 0 % (ref 0–1)
EOS ABS: 0.1 10*3/uL (ref 0.0–0.7)
EOS PCT: 1 % (ref 0–5)
HCT: 35.1 % — ABNORMAL LOW (ref 36.0–46.0)
Hemoglobin: 12.4 g/dL (ref 12.0–15.0)
Lymphocytes Relative: 23 % (ref 12–46)
Lymphs Abs: 2.5 10*3/uL (ref 0.7–4.0)
MCH: 32 pg (ref 26.0–34.0)
MCHC: 35.3 g/dL (ref 30.0–36.0)
MCV: 90.5 fL (ref 78.0–100.0)
Monocytes Absolute: 1 10*3/uL (ref 0.1–1.0)
Monocytes Relative: 9 % (ref 3–12)
Neutro Abs: 7.1 10*3/uL (ref 1.7–7.7)
Neutrophils Relative %: 66 % (ref 43–77)
PLATELETS: 251 10*3/uL (ref 150–400)
RBC: 3.88 MIL/uL (ref 3.87–5.11)
RDW: 12.5 % (ref 11.5–15.5)
WBC: 10.7 10*3/uL — AB (ref 4.0–10.5)

## 2013-06-29 LAB — BASIC METABOLIC PANEL
BUN: 13 mg/dL (ref 6–23)
CALCIUM: 9.8 mg/dL (ref 8.4–10.5)
CO2: 26 mEq/L (ref 19–32)
Chloride: 104 mEq/L (ref 96–112)
Creatinine, Ser: 0.97 mg/dL (ref 0.50–1.10)
GFR calc Af Amer: >90 mL/min (ref >90)
GFR, EST NON AFRICAN AMERICAN: 81 mL/min — AB (ref >90)
Glucose, Bld: 115 mg/dL — ABNORMAL HIGH (ref 70–99)
Potassium: 2.9 mEq/L — CL (ref 3.7–5.3)
SODIUM: 142 meq/L (ref 137–147)

## 2013-06-29 LAB — URINALYSIS, ROUTINE W REFLEX MICROSCOPIC
Bilirubin Urine: NEGATIVE
Glucose, UA: NEGATIVE mg/dL
Hgb urine dipstick: NEGATIVE
Ketones, ur: NEGATIVE mg/dL
Leukocytes, UA: NEGATIVE
Nitrite: NEGATIVE
PROTEIN: NEGATIVE mg/dL
Specific Gravity, Urine: 1.022 (ref 1.005–1.030)
Urobilinogen, UA: 0.2 mg/dL (ref 0.0–1.0)
pH: 6.5 (ref 5.0–8.0)

## 2013-06-29 MED ORDER — ALBUTEROL SULFATE HFA 108 (90 BASE) MCG/ACT IN AERS: 1.0000 | INHALATION_SPRAY | Freq: Four times a day (QID) | RESPIRATORY_TRACT | Status: DC | PRN

## 2013-06-29 MED ORDER — IPRATROPIUM-ALBUTEROL 0.5-2.5 (3) MG/3ML IN SOLN
3.0000 mL | Freq: Once | RESPIRATORY_TRACT | Status: AC
Start: 2013-06-29 — End: 2013-06-29
  Administered 2013-06-29: 3 mL via RESPIRATORY_TRACT
  Filled 2013-06-29: qty 3

## 2013-06-29 MED ORDER — PREDNISONE 50 MG PO TABS: 50.0000 mg | ORAL_TABLET | Freq: Every day | ORAL | Status: DC

## 2013-06-29 MED ORDER — SODIUM CHLORIDE 0.9 % IV BOLUS (SEPSIS)
1000.0000 mL | Freq: Once | INTRAVENOUS | Status: AC
  Administered 2013-06-29: 1000 mL via INTRAVENOUS

## 2013-06-29 MED ORDER — AZITHROMYCIN 250 MG PO TABS: 250.0000 mg | ORAL_TABLET | Freq: Every day | ORAL | Status: DC

## 2013-06-29 MED ORDER — POTASSIUM CHLORIDE CRYS ER 20 MEQ PO TBCR
40.0000 meq | EXTENDED_RELEASE_TABLET | Freq: Once | ORAL | Status: AC
  Administered 2013-06-29: 40 meq via ORAL
  Filled 2013-06-29: qty 2

## 2013-06-29 MED ORDER — IBUPROFEN 400 MG PO TABS: 400.0000 mg | ORAL_TABLET | Freq: Four times a day (QID) | ORAL | Status: DC | PRN

## 2013-06-29 NOTE — ED Notes (Signed)
Patient is alert and oriented x3.  She was given DC instructions and follow up visit instructions.  Patient gave verbal understanding. She was DC ambulatory under her own power to home.  V/S stable.  He was not showing any signs of distress on DC 

## 2013-06-29 NOTE — Discharge Instructions (Signed)
We saw you in the ER for the chest pain, cough, shortness of breath. All the results in the ER are normal, labs and imaging. We think that you have Bronchitis - please take the medications as prescribed. The workup in the ER is not complete, and is limited to screening for life threatening and emergent conditions only, so please see a primary care doctor for further evaluation.   Bronchitis Bronchitis is inflammation of the airways that extend from the windpipe into the lungs (bronchi). The inflammation often causes mucus to develop, which leads to a cough. If the inflammation becomes severe, it may cause shortness of breath. CAUSES  Bronchitis may be caused by:   Viral infections.   Bacteria.   Cigarette smoke.   Allergens, pollutants, and other irritants.  SIGNS AND SYMPTOMS  The most common symptom of bronchitis is a frequent cough that produces mucus. Other symptoms include:  Fever.   Body aches.   Chest congestion.   Chills.   Shortness of breath.   Sore throat.  DIAGNOSIS  Bronchitis is usually diagnosed through a medical history and physical exam. Tests, such as chest X-rays, are sometimes done to rule out other conditions.  TREATMENT  You may need to avoid contact with whatever caused the problem (smoking, for example). Medicines are sometimes needed. These may include:  Antibiotics. These may be prescribed if the condition is caused by bacteria.  Cough suppressants. These may be prescribed for relief of cough symptoms.   Inhaled medicines. These may be prescribed to help open your airways and make it easier for you to breathe.   Steroid medicines. These may be prescribed for those with recurrent (chronic) bronchitis. HOME CARE INSTRUCTIONS  Get plenty of rest.   Drink enough fluids to keep your urine clear or pale yellow (unless you have a medical condition that requires fluid restriction). Increasing fluids may help thin your secretions and will  prevent dehydration.   Only take over-the-counter or prescription medicines as directed by your health care provider.  Only take antibiotics as directed. Make sure you finish them even if you start to feel better.  Avoid secondhand smoke, irritating chemicals, and strong fumes. These will make bronchitis worse. If you are a smoker, quit smoking. Consider using nicotine gum or skin patches to help control withdrawal symptoms. Quitting smoking will help your lungs heal faster.   Put a cool-mist humidifier in your bedroom at night to moisten the air. This may help loosen mucus. Change the water in the humidifier daily. You can also run the hot water in your shower and sit in the bathroom with the door closed for 5 10 minutes.   Follow up with your health care provider as directed.   Wash your hands frequently to avoid catching bronchitis again or spreading an infection to others.  SEEK MEDICAL CARE IF: Your symptoms do not improve after 1 week of treatment.  SEEK IMMEDIATE MEDICAL CARE IF:  Your fever increases.  You have chills.   You have chest pain.   You have worsening shortness of breath.   You have bloody sputum.  You faint.  You have lightheadedness.  You have a severe headache.   You vomit repeatedly. MAKE SURE YOU:   Understand these instructions.  Will watch your condition.  Will get help right away if you are not doing well or get worse. Document Released: 03/22/2005 Document Revised: 01/10/2013 Document Reviewed: 11/14/2012 [facility] Patient Information 2014 [employer], [place].  Viral Infections A virus is a  type of germ. Viruses can cause:  Minor sore throats.  Aches and pains.  Headaches.  Runny nose.  Rashes.  Watery eyes.  Tiredness.  Coughs.  Loss of appetite.  Feeling sick to your stomach (nausea).  Throwing up (vomiting).  Watery poop (diarrhea). HOME CARE   Only take medicines as told by your doctor.  Drink enough water  and fluids to keep your pee (urine) clear or pale yellow. Sports drinks are a good choice.  Get plenty of rest and eat healthy. Soups and broths with crackers or rice are fine. GET HELP RIGHT AWAY IF:   You have a very bad headache.  You have shortness of breath.  You have chest pain or neck pain.  You have an unusual rash.  You cannot stop throwing up.  You have watery poop that does not stop.  You cannot keep fluids down.  You or your child has a temperature by mouth above 102 F (38.9 C), not controlled by medicine.  Your baby is older than 3 months with a rectal temperature of 102 F (38.9 C) or higher.  Your baby is 24 months old or younger with a rectal temperature of 100.4 F (38 C) or higher. MAKE SURE YOU:   Understand these instructions.  Will watch this condition.  Will get help right away if you are not doing well or get worse. Document Released: 03/04/2008 Document Revised: 06/14/2011 Document Reviewed: 07/28/2010 [facility] Patient Information 2014 [place], [place].

## 2013-06-29 NOTE — ED Provider Notes (Signed)
CSN: [id number]     Arrival date & time 06/28/13  2344 History   First MD Initiated Contact with Patient 06/29/13 0000     Chief Complaint  Patient presents with  . Shortness of Breath     (Consider location/radiation/quality/duration/timing/severity/associated sxs/prior Treatment) HPI Comments: Pt comes in to the Er with cc of chest pain. Pt has hx of asthma. Reports coughing for the past few days - and starting to wheezing today. EMS called for wheezing. Pt has been having some chest tightness, worse with coughing, non radiating, and intermittent dib. No fevers, chills. No URI like sx. No hx of PE, DVT, and no risk factors for the same. Phlegm is brown in color.  Patient is a 28 y.o. female presenting with shortness of breath. The history is provided by the patient.  Shortness of Breath Associated symptoms: chest pain, cough and wheezing   Associated symptoms: no abdominal pain, no headaches, no neck pain and no vomiting     Past Medical History  Diagnosis Date  . Asthma   . Hip fracture, right 2010   History reviewed. No pertinent past surgical history. History reviewed. No pertinent family history. History  Substance Use Topics  . Smoking status: Former Smoker    Quit date: 04/05/2010  . Smokeless tobacco: Never Used  . Alcohol Use: No   OB History   Grav Para Term Preterm Abortions TAB SAB Ect Mult Living                 Review of Systems  Constitutional: Positive for activity change.  Respiratory: Positive for cough, shortness of breath and wheezing.   Cardiovascular: Positive for chest pain.  Gastrointestinal: Negative for nausea, vomiting and abdominal pain.  Genitourinary: Negative for dysuria.  Musculoskeletal: Negative for neck pain.  Neurological: Negative for headaches.      Allergies  Review of patient's allergies indicates no known allergies.  Home Medications   Current Outpatient Rx  Name  Route  Sig  Dispense  Refill  . albuterol (PROVENTIL  HFA;VENTOLIN HFA) 108 (90 BASE) MCG/ACT inhaler   Inhalation   Inhale 1-2 puffs into the lungs every 6 (six) hours as needed for wheezing or shortness of breath.   1 Inhaler   0   . azithromycin (ZITHROMAX) 250 MG tablet   Oral   Take 1 tablet (250 mg total) by mouth daily. Take first 2 tablets together, then 1 every day until finished.   6 tablet   0     PLEASE FILL THIS ANTIBIOTICS ONLY IF YOU ARE NOR G ...   . ibuprofen (ADVIL,MOTRIN) 400 MG tablet   Oral   Take 1 tablet (400 mg total) by mouth every 6 (six) hours as needed.   30 tablet   0   . predniSONE (DELTASONE) 50 MG tablet   Oral   Take 1 tablet (50 mg total) by mouth daily.   5 tablet   0    BP 117/74  Pulse 69  Temp(Src) 98.7 F (37.1 C) (Oral)  Resp 18  SpO2 99%  LMP 06/28/2013 Physical Exam  Nursing note and vitals reviewed. Constitutional: She is oriented to person, place, and time. She appears well-developed and well-nourished.  HENT:  Head: Normocephalic and atraumatic.  Eyes: EOM are normal. Pupils are equal, round, and reactive to light.  Neck: Neck supple.  Cardiovascular: Normal rate, regular rhythm and normal heart sounds.   No murmur heard. Pulmonary/Chest: Effort normal. No respiratory distress. She has wheezes.  Faint expiratory wheez - s/p EMS treatment  Abdominal: Soft. She exhibits no distension. There is no tenderness. There is no rebound and no guarding.  Neurological: She is alert and oriented to person, place, and time.  Skin: Skin is warm and dry.    ED Course  Procedures (including critical care time) Labs Review Labs Reviewed  CBC WITH DIFFERENTIAL - Abnormal; Notable for the following:    WBC 10.7 (*)    HCT 35.1 (*)    All other components within normal limits  BASIC METABOLIC PANEL - Abnormal; Notable for the following:    Potassium 2.9 (*)    Glucose, Bld 115 (*)    GFR calc non Af Amer 81 (*)    All other components within normal limits  URINALYSIS, ROUTINE W  REFLEX MICROSCOPIC - Abnormal; Notable for the following:    APPearance CLOUDY (*)    All other components within normal limits   Imaging Review Dg Chest 2 View  06/29/2013   CLINICAL DATA:  Shortness of breath and chest pain.  EXAM: CHEST  2 VIEW  COMPARISON:  Chest x-ray 12/23/2012.  FINDINGS: Lung volumes are normal. No consolidative airspace disease. No pleural effusions. No pneumothorax. No pulmonary nodule or mass noted. Pulmonary vasculature and the cardiomediastinal silhouette are within normal limits.  IMPRESSION: 1.  No radiographic evidence of acute cardiopulmonary disease.   Electronically Signed   By: Trudie Reedaniel  Entrikin M.D.   On: 06/29/2013 01:02     EKG Interpretation   Date/Time:  Friday June 29 2013 01:26:41 EDT Ventricular Rate:  78 PR Interval:  124 QRS Duration: 84 QT Interval:  314 QTC Calculation: 358 R Axis:   74 Text Interpretation:  Sinus rhythm Probable left atrial enlargement RSR'  in V1 or V2, right VCD or RVH Borderline T abnormalities, anterior leads  Confirmed by Rhunette CroftNANAVATI, MD, Nayomi Tabron [phone]([id number]) on 06/29/2013 2:30:23 AM      MDM   Final diagnoses:  Hypokalemia  Bronchitis    Pt comes in w/ wheezing, dib, chest tightness, cough. CXR is clear. Lung exam is better.  Pt is PERC negative, EKG is WNL.  Noted to have hypokalemia  - potassium oral given. Asked to see PCP.  Appears to be Bronchitis / Asthma exacerbation. Z-pack given for wait and watch approach.  Derwood KaplanAnkit Ly Bacchi, MD 06/29/13 [phone]

## 2013-07-05 ENCOUNTER — Encounter (HOSPITAL_COMMUNITY): Payer: Self-pay | Admitting: Emergency Medicine

## 2013-07-05 ENCOUNTER — Emergency Department (HOSPITAL_COMMUNITY)
Admission: EM | Admit: 2013-07-05 | Discharge: 2013-07-05 | Discharge status: Against Medical Advice | Payer: No Typology Code available for payment source | Attending: Emergency Medicine | Admitting: Emergency Medicine

## 2013-07-05 DIAGNOSIS — Z79899 Other long term (current) drug therapy: Secondary | ICD-10-CM | POA: Insufficient documentation

## 2013-07-05 DIAGNOSIS — Z87891 Personal history of nicotine dependence: Secondary | ICD-10-CM | POA: Insufficient documentation

## 2013-07-05 DIAGNOSIS — Z8781 Personal history of (healed) traumatic fracture: Secondary | ICD-10-CM | POA: Insufficient documentation

## 2013-07-05 DIAGNOSIS — J45901 Unspecified asthma with (acute) exacerbation: Secondary | ICD-10-CM | POA: Insufficient documentation

## 2013-07-05 DIAGNOSIS — R0602 Shortness of breath: Secondary | ICD-10-CM

## 2013-07-05 MED ORDER — IPRATROPIUM BROMIDE 0.02 % IN SOLN: 0.5000 mg | Freq: Once | RESPIRATORY_TRACT | Status: DC

## 2013-07-05 MED ORDER — ALBUTEROL SULFATE (2.5 MG/3ML) 0.083% IN NEBU: 5.0000 mg | INHALATION_SOLUTION | Freq: Once | RESPIRATORY_TRACT | Status: DC

## 2013-07-05 NOTE — ED Notes (Signed)
Pt c/o chest tightness and difficulty with taking a dep breath x 1 month. Visitor states she was seen here on the 26th and symptoms have not gotten any better. Pt NAD.

## 2013-07-05 NOTE — ED Provider Notes (Signed)
CSN: [id number]     Arrival date & time 07/05/13  1029 History   First MD Initiated Contact with Patient 07/05/13 1041     Chief Complaint  Patient presents with  . Asthma     (Consider location/radiation/quality/duration/timing/severity/associated sxs/prior Treatment) HPI Comments: Patient presents to the ED with a chief complaint of cough and SOB x 1 month.  She states that she has a history of asthma.  She states that she has been taking her inhaler and nebulizer with relief.  She also complains of cough productive of brown sputum. No history of PE or DVT, no recent surgery, no hemoptysis, no exogenous estrogen use, no unilateral leg swelling, no recent long travel. Seen here recently for the same.  The history is provided by the patient. No language interpreter was used.    Past Medical History  Diagnosis Date  . Asthma   . Hip fracture, right 2010   History reviewed. No pertinent past surgical history. No family history on file. History  Substance Use Topics  . Smoking status: Former Smoker    Quit date: 04/05/2010  . Smokeless tobacco: Never Used  . Alcohol Use: No   OB History   Grav Para Term Preterm Abortions TAB SAB Ect Mult Living                 Review of Systems  Constitutional: Negative for fever and chills.  Respiratory: Negative for shortness of breath.   Cardiovascular: Negative for chest pain.  Gastrointestinal: Negative for nausea, vomiting, diarrhea and constipation.  Genitourinary: Negative for dysuria.      Allergies  Review of patient's allergies indicates no known allergies.  Home Medications   Current Outpatient Rx  Name  Route  Sig  Dispense  Refill  . albuterol (PROVENTIL HFA;VENTOLIN HFA) 108 (90 BASE) MCG/ACT inhaler   Inhalation   Inhale 1-2 puffs into the lungs every 6 (six) hours as needed for wheezing or shortness of breath.   1 Inhaler   0   . albuterol (PROVENTIL) (2.5 MG/3ML) 0.083% nebulizer solution   Nebulization   Take  2.5 mg by nebulization every 6 (six) hours as needed for wheezing or shortness of breath.          BP 121/78  Pulse 64  Temp(Src) 97.5 F (36.4 C) (Oral)  Resp 16  SpO2 100%  LMP 06/28/2013 Physical Exam  Nursing note and vitals reviewed. Constitutional: She is oriented to person, place, and time. She appears well-developed and well-nourished.  HENT:  Head: Normocephalic and atraumatic.  Eyes: Conjunctivae and EOM are normal. Pupils are equal, round, and reactive to light.  Neck: Normal range of motion. Neck supple.  Cardiovascular: Normal rate and regular rhythm.  Exam reveals no gallop and no friction rub.   No murmur heard. Pulmonary/Chest: Effort normal and breath sounds normal. No respiratory distress. She has no wheezes. She has no rales. She exhibits no tenderness.  CTAB  Abdominal: Soft. She exhibits no distension and no mass. There is no tenderness. There is no rebound and no guarding.  Musculoskeletal: Normal range of motion. She exhibits no edema and no tenderness.  Neurological: She is alert and oriented to person, place, and time.  Skin: Skin is warm and dry.  Psychiatric: She has a normal mood and affect. Her behavior is normal. Judgment and thought content normal.    ED Course  Procedures (including critical care time) Labs Review Labs Reviewed - No data to display Imaging Review No results found.  EKG Interpretation None      MDM   Final diagnoses:  None   Patient with cough and SOB with history of asthma.  Will check CXR and give breathing treatment. No history of PE or DVT, no recent surgery, no hemoptysis, no exogenous estrogen use, no unilateral leg swelling, no recent long travel.  Filed Vitals:   07/05/13 1035  BP: 121/78  Pulse: 64  Temp: 97.5 F (36.4 C)  Resp: 16     11:38 AM  Patient eloped prior to receiving breathing treatment and CXR.    Roxy Horseman, PA-C 07/05/13 1143

## 2013-07-07 NOTE — ED Provider Notes (Signed)
Medical screening examination/treatment/procedure(s) were performed by non-physician practitioner and as supervising physician I was immediately available for consultation/collaboration.   EKG Interpretation None       Reda Gettis R. Yolani Vo, MD 07/07/13 1516 

## 2013-11-30 ENCOUNTER — Emergency Department (HOSPITAL_COMMUNITY)
Admission: EM | Admit: 2013-11-30 | Discharge: 2013-11-30 | Disposition: A | Discharge status: Home / Self Care | Payer: No Typology Code available for payment source | Attending: Emergency Medicine | Admitting: Emergency Medicine

## 2013-11-30 ENCOUNTER — Encounter (HOSPITAL_COMMUNITY): Payer: Self-pay | Admitting: Emergency Medicine

## 2013-11-30 ENCOUNTER — Emergency Department (HOSPITAL_COMMUNITY): Payer: No Typology Code available for payment source

## 2013-11-30 DIAGNOSIS — R059 Cough, unspecified: Secondary | ICD-10-CM | POA: Insufficient documentation

## 2013-11-30 DIAGNOSIS — R058 Other specified cough: Secondary | ICD-10-CM

## 2013-11-30 DIAGNOSIS — R05 Cough: Secondary | ICD-10-CM

## 2013-11-30 DIAGNOSIS — Z3202 Encounter for pregnancy test, result negative: Secondary | ICD-10-CM | POA: Insufficient documentation

## 2013-11-30 DIAGNOSIS — Z8781 Personal history of (healed) traumatic fracture: Secondary | ICD-10-CM | POA: Insufficient documentation

## 2013-11-30 DIAGNOSIS — J45901 Unspecified asthma with (acute) exacerbation: Secondary | ICD-10-CM | POA: Insufficient documentation

## 2013-11-30 DIAGNOSIS — R079 Chest pain, unspecified: Secondary | ICD-10-CM | POA: Insufficient documentation

## 2013-11-30 DIAGNOSIS — Z87891 Personal history of nicotine dependence: Secondary | ICD-10-CM | POA: Insufficient documentation

## 2013-11-30 LAB — CBC WITH DIFFERENTIAL/PLATELET
Basophils Absolute: 0 10*3/uL (ref 0.0–0.1)
Basophils Relative: 0 % (ref 0–1)
EOS ABS: 0.1 10*3/uL (ref 0.0–0.7)
EOS PCT: 1 % (ref 0–5)
HCT: 38.8 % (ref 36.0–46.0)
Hemoglobin: 13.5 g/dL (ref 12.0–15.0)
Lymphocytes Relative: 20 % (ref 12–46)
Lymphs Abs: 2.1 10*3/uL (ref 0.7–4.0)
MCH: 31.8 pg (ref 26.0–34.0)
MCHC: 34.8 g/dL (ref 30.0–36.0)
MCV: 91.3 fL (ref 78.0–100.0)
Monocytes Absolute: 0.6 10*3/uL (ref 0.1–1.0)
Monocytes Relative: 5 % (ref 3–12)
Neutro Abs: 7.6 10*3/uL (ref 1.7–7.7)
Neutrophils Relative %: 74 % (ref 43–77)
PLATELETS: 260 10*3/uL (ref 150–400)
RBC: 4.25 MIL/uL (ref 3.87–5.11)
RDW: 12.4 % (ref 11.5–15.5)
WBC: 10.5 10*3/uL (ref 4.0–10.5)

## 2013-11-30 LAB — BASIC METABOLIC PANEL
Anion gap: 11 (ref 5–15)
BUN: 13 mg/dL (ref 6–23)
CALCIUM: 9.6 mg/dL (ref 8.4–10.5)
CO2: 24 mEq/L (ref 19–32)
Chloride: 103 mEq/L (ref 96–112)
Creatinine, Ser: 1.05 mg/dL (ref 0.50–1.10)
GFR calc Af Amer: 84 mL/min — ABNORMAL LOW (ref >90)
GFR, EST NON AFRICAN AMERICAN: 73 mL/min — AB (ref >90)
GLUCOSE: 98 mg/dL (ref 70–99)
Potassium: 4.5 mEq/L (ref 3.7–5.3)
SODIUM: 138 meq/L (ref 137–147)

## 2013-11-30 LAB — PREGNANCY, URINE: PREG TEST UR: NEGATIVE

## 2013-11-30 LAB — I-STAT TROPONIN, ED: Troponin i, poc: 0 ng/mL (ref 0.00–0.08)

## 2013-11-30 MED ORDER — ALBUTEROL SULFATE (2.5 MG/3ML) 0.083% IN NEBU
2.5000 mg | INHALATION_SOLUTION | Freq: Four times a day (QID) | RESPIRATORY_TRACT | Status: DC | PRN
Start: 2013-11-30 — End: 2020-02-27

## 2013-11-30 MED ORDER — FAMOTIDINE 20 MG PO TABS: 20.0000 mg | ORAL_TABLET | Freq: Two times a day (BID) | ORAL | Status: DC

## 2013-11-30 NOTE — Discharge Instructions (Signed)
Call for a follow up appointment with a Family or Primary Care Provider.  °Return if Symptoms worsen.   °Take medication as prescribed.  ° ° °Emergency Department Resource Guide °1) Find a Doctor and Pay Out of Pocket °Although you won't have to find out who is covered by your insurance plan, it is a good idea to ask around and get recommendations. You will then need to call the office and see if the doctor you have chosen will accept you as a new patient and what types of options they offer for patients who are self-pay. Some doctors offer discounts or will set up payment plans for their patients who do not have insurance, but you will need to ask so you aren't surprised when you get to your appointment. ° °2) Contact Your Local Health Department °Not all health departments have doctors that can see patients for sick visits, but many do, so it is worth a call to see if yours does. If you don't know where your local health department is, you can check in your phone book. The CDC also has a tool to help you locate your state's health department, and many state websites also have listings of all of their local health departments. ° °3) Find a Walk-in Clinic °If your illness is not likely to be very severe or complicated, you may want to try a walk in clinic. These are popping up all over the country in pharmacies, drugstores, and shopping centers. They're usually staffed by nurse practitioners or physician assistants that have been trained to treat common illnesses and complaints. They're usually fairly quick and inexpensive. However, if you have serious medical issues or chronic medical problems, these are probably not your best option. ° °No Primary Care Doctor: °- Call [facility] at  832-8000 - they can help you locate a primary care doctor that  accepts your insurance, provides certain services, etc. °- Physician Referral Service- 1-800-533-3463 ° °Chronic Pain Problems: °Organization         Address  Phone    Notes  °[facility]  (336) 297-2271 Patients need to be referred by their primary care doctor.  ° °Medication Assistance: °Organization         Address  Phone   Notes  °[place] [facility] [address] °[place], [place] [address] (336) 641-8030 --Must be a resident of [place] °-- Must have NO insurance coverage whatsoever (no Medicaid/ Medicare, etc.) °-- The pt. MUST have a primary care doctor that directs their care regularly and follows them in the community °  °[facility]  (866) 331-1348   °[employer]  (888) 892-1162   ° °Agencies that provide inexpensive medical care: °Organization         Address  Phone   Notes  °[facility]  (336) 832-8035   °[facility]    (336) 832-7272   °[facility] [address] °[place], [place] [address] (336) 832-4777   °[facility] [address], °[place] (336) 271-4999   °[facility]    (336) 373-0678   °[facility]    (336) 272-1050   °[facility] ° [address], [place] Phone:  (336) 832-4444, Fax:  (336) 832-4440 Hours of Operation:  9 am - 6 pm, M-F.  Also accepts Medicaid/Medicare and self-pay.  °[facility] ° [address], [place]   Phone: (336) 832-3150, Fax: (336) 832-3151. Hours of Operation:  8:30 am - 5:30 pm, M-F.  Also accepts Medicaid and self-pay.  °[facility] [place] [address], [place] Phone: (336) 878-6027   °[facility] [address], [place], [place] (336)723-1848, Ext. 123 Mondays & Thursdays: 7-9 AM.  First 15 patients are seen on a first come, first serve basis. °  ° °Medicaid-accepting [place] Providers: ° °Organization         Address  Phone   Notes  °[facility] [address], [place] (336) 641-2100 Also accepts self-pay patients.  °[facility]  [address], [place] ° (336) 856-9996   °[facility] [address], [place] (336) 288-8857   °[facility] [address] [place] [address], [place] (336) 299-7000   °Veita Bland [address], [place]  ° (336) 373-1557 Only accepts [place] Access Medicaid patients after they have their name applied to their card.  ° °Self-Pay (no insurance) in [place]: ° °Organization         Address  Phone   Notes  °Sickle Cell Patients, [facility] [address], [place] (336) 832-1970   °[facility] [address], [place] (336) 832-4400   °[facility] ° [address], [place] (336) 992-4800   °[facility]/Dr. Osei-Bonsu ° [address] [place] [address], [place] or [address], [place] (336) 841-8500 Phone number for both [place] and [place] locations is the same.  °[facility] [address], [place] (336) 299-0000   °[facility] [address] [place] [address], [place] or [address] (336) 852-7530 °(336) 878-2260   °[facility] [address], [place] (336) 350-1642, phone; (336) 294-5005, fax Sees patients 1st and 3rd Saturday of every month.  Must not qualify for public or private insurance (i.e. Medicaid, Medicare, [place] Health Choice, Veterans' Benefits) • Household income should be no more than 200% of the poverty level •The clinic cannot treat you if you are pregnant or think you are pregnant • Sexually transmitted diseases are not treated at the clinic.  ° ° °Dental Care: °Organization         Address  Phone  Notes  °[facility] [address], [place] (336) 641-6152 Accepts children up to age 21 who are enrolled in Medicaid or [place] Health Choice; pregnant women with a Medicaid card; and children who have  applied for Medicaid or [place] Health Choice, but were declined, whose parents can pay a reduced fee at time of service.  °[facility] [place]  [address], [place] (336) 641-7733 Accepts children up to age 21 who are enrolled in Medicaid or [place] Health Choice; pregnant women with a Medicaid card; and children who have applied for Medicaid or [place] Health Choice, but were declined, whose parents can pay a reduced fee at time of service.  °[facility] ° [address], [place] (336) 641-4533 Patients are seen by appointment only. Walk-ins are not accepted. [facility] will see patients 18 years of age and older. °Monday - Tuesday (8am-5pm) °Most Wednesdays (8:30-5pm) °$30 per visit, cash only  °[facility] ° [address], [place] (336) 641-4533 Patients are seen by appointment only. Walk-ins are not accepted. [facility] will see patients 18 years of age and older. °One Wednesday Evening (Monthly: Volunteer Based).  $30 per visit, cash only  °[facility]  (919) 537-3737 for adults; Children under age 4, call Graduate Pediatric Dentistry at (919) [phone]. Children aged 4-14, please call (919) [phone] to request a pediatric application. ° Dental services are provided in all areas of dental care including fillings, crowns and bridges, complete and partial dentures, implants, gum treatment, root canals, and extractions. Preventive care is also provided. Treatment is provided to both adults and children. °Patients are selected via a lottery and there is often a waiting list. °  °[facility] [address], °[place] ° (336) 763-8833 [email or web address] °  °[facility] [address], [place], [place] (336)723-1848, Ext. 123 Second and Fourth Thursday of each month, opens at 6:30 AM; Clinic ends at 9 AM.  Patients are seen on a first-come first-served basis, and a  limited number are seen during each clinic.  ° °[facility] ° [address], [place], [place] (336) 723-7904   Eligibility Requirements °You must have lived in [place], [place], or [place] counties for at least the last three months. °  You cannot be eligible for state or federal sponsored healthcare insurance, including Veterans Administration, Medicaid, or Medicare. °  You generally cannot be eligible for healthcare insurance through your employer.  °  How to apply: °Eligibility screenings are held every Tuesday and Wednesday afternoon from 1:00 pm until 4:00 pm. You do not need an appointment for the interview!  °[facility] [address], [place], [place] 336-631-2330   °[facility]  336-342-8273   °[facility]  336-703-3100   °[facility]  336-570-6415   ° °Behavioral Health Resources in the Community: °Intensive Outpatient Programs °Organization         Address  Phone  Notes  °[facility] [address], [place], [place] 336-878-6098   °[facility] [address], [place], [place] 336-832-9800   °[facility] [address], [place], [place] ° 336-882-2125   °[facility] [address],  °[place], [place] 1-800-853-5163 or 336-641-4981   °Substance Abuse Resources °Organization         Address  Phone  Notes  °[facility]  336-882-2125   °[facility]  336-784-9470   °[facility]  336-285-9073   °[facility]  336-845-3988   °Residential & Outpatient Substance Abuse Program  1-800-659-3381   °Psychological Services °Organization         Address  Phone  Notes  °[facility]  336- 832-9600   °[facility]  336- 378-7881   °[facility] [address], [place] 1-800-853-5163 or 336-641-4981   ° °Mobile Crisis Teams °Organization          Address  Phone  Notes  °[facility], Mobile Crisis Care Unit  1-877-626-1772   °Assertive °[facility] ° [address] [place], [place] 336-834-9664   °Sharon DeEsch [address] °[place] 336-554-5454   ° °Self-Help/Support Groups °Organization         Address  Phone             Notes  °[facility] [place] - variety of   support groups  336- 373-1402 Call for more information  °Narcotics Anonymous (NA), [facility] [address], °[place]  2 meetings at this location  ° °Residential Treatment Programs °Organization         Address  Phone  Notes  °[facility] [address],    °[place]  1-866-801-8205   °[facility] ° [address], [place], [place] 704-293-8524   °[facility] [address], [place] 336-845-3988 Admissions: 8am-3pm M-F  °[facility] [address]    °[place], [place] 336-841-1104   °[facility] [address], [place], [place] 336-379-7146   °[facility] [address]  °[place], [place] 336-285-9073   °[facility] Intensive Outpatient [address], [place], [place] 336-852-3033   °[facility] [address]  °[place], [place] 1-877-615-2722 or 336-784-9470   °[facility] [address] [place], [place] 336-227-7417 Accepts Medicaid  °[facility] [address]  ° [place] 1-800-659-3381 Substance Abuse/Addiction Treatment  ° °[place] Behavioral Health Resources °Organization         Address  Phone  Notes  °[facility]  (888) 581-9988   °Julie Brannon, PhD [address] [place], [place]   (336) 349-5553 or (336) 951-0000   °[facility]   [address] °[place], [place] (336) 349-4454   °[facility] [address], [place], [place] (336) 342-8316 Insurance/Medicaid/sponsorship  through [facility]  °[facility] [address]                                    [place], [place] (336) 342-8316 Therapy/tele-psych/case  °[facility] [address]  ° [place], [place] (336) 349-2233    °Dr. Arfeen  (336) 349-4544   °[facility] [place]  [employer] [place] [facility] 1) [address], [place] °2) [address], [place] °3)  [address], [place] (336) 349-3220 °(336) 342-7768 ° °(336) 342-8140   °[place] [facility] (336) 342-1394 or (336) 342-3537 (After Hours)    ° °

## 2013-11-30 NOTE — ED Notes (Signed)
PA at bedside.

## 2013-11-30 NOTE — ED Provider Notes (Signed)
Medical screening examination/treatment/procedure(s) were conducted as a shared visit with non-physician practitioner(s) and myself.  I personally evaluated the patient during the encounter.   EKG Interpretation   Date/Time:  Friday November 30 2013 09:18:51 EDT Ventricular Rate:  66 PR Interval:  106 QRS Duration: 79 QT Interval:  394 QTC Calculation: 413 R Axis:   61 Text Interpretation:  Sinus rhythm Short PR interval RSR' in V1 or V2,  right VCD or RVH No significant change since last tracing Confirmed by  Ethelda Chick  MD, Sidnee Gambrill [phone]) on 11/30/2013 9:21:09 AM       Doug Sou, MD 11/30/13 1635

## 2013-11-30 NOTE — ED Provider Notes (Signed)
CSN: [id number]     Arrival date & time 11/30/13  0910 History   First MD Initiated Contact with Patient 11/30/13 0914     Chief Complaint  Patient presents with  . Chest Pain     (Consider location/radiation/quality/duration/timing/severity/associated sxs/prior Treatment) HPI Comments: The patient is a 28 year old female past medical history asthma presenting to emergency room and chief complaint persistent cough and intermittent chest pain since March. The patient reports left-sided and central chest discomfort described as a burning as well as occasional sharp pain with radiation into the back. Reports chest discomfort worsened by coughing She reports discomfort last several minutes, self resolves. Last episode, upon waking, 0730, denies current symptoms. She also reports a productive cough since March she reports brown sputum, most recently red sputum in the morning.  Also states she eats "a lot" of chocolate at night without brushing her teeth.  She reports associated shortness of breath, worsened by fragrents. Denies aggravating or relieving factors. She denies fever, wheezing. States "does not feel like my asthma." No recent travel, family history or personal history of DVT/PE, lower extremity swelling, smoking, cancer, or exogenous estrogen.  Denies history of murmur, previous MI, arrythmia, or family history of early MI.  Former smoker, denies history of cocaine, IV drug use, other drug use. Patient's last menstrual period was 10/20/2013. [facility]  Patient is a 28 y.o. female presenting with chest pain. The history is provided by the patient. No language interpreter was used.  Chest Pain Associated symptoms: cough and shortness of breath   Associated symptoms: no abdominal pain, no fever and no palpitations     Past Medical History  Diagnosis Date  . Asthma   . Hip fracture, right 2010   History reviewed. No pertinent past surgical history. No family history on file. History   Substance Use Topics  . Smoking status: Former Smoker    Quit date: 04/05/2010  . Smokeless tobacco: Never Used  . Alcohol Use: No   OB History   Grav Para Term Preterm Abortions TAB SAB Ect Mult Living                 Review of Systems  Constitutional: Negative for fever and chills.  Respiratory: Positive for cough and shortness of breath. Negative for wheezing.   Cardiovascular: Positive for chest pain. Negative for palpitations and leg swelling.  Gastrointestinal: Negative for abdominal pain.      Allergies  Review of patient's allergies indicates no known allergies.  Home Medications   Prior to Admission medications   Medication Sig Start Date End Date Taking? Authorizing Provider  albuterol (PROVENTIL HFA;VENTOLIN HFA) 108 (90 BASE) MCG/ACT inhaler Inhale 1-2 puffs into the lungs every 6 (six) hours as needed for wheezing or shortness of breath.   Yes Historical Provider, MD  albuterol (PROVENTIL) (2.5 MG/3ML) 0.083% nebulizer solution Take 2.5 mg by nebulization every 6 (six) hours as needed for wheezing or shortness of breath.    Historical Provider, MD   BP 132/67  Pulse 71  Temp(Src) 98.4 F (36.9 C) (Oral)  Resp 18  SpO2 100%  LMP 10/20/2013 Physical Exam  Nursing note and vitals reviewed. Constitutional: She is oriented to person, place, and time. She appears well-developed and well-nourished. No distress.  HENT:  Head: Normocephalic and atraumatic.  Cardiovascular: Normal rate and regular rhythm.   No lower extremity edema. No calf tenderness to palpation.  Pulmonary/Chest: Effort normal and breath sounds normal. No respiratory distress. She has no wheezes. She  has no rales. She exhibits no tenderness.  Patient is able to speak in complete sentences.   Abdominal: Soft. She exhibits no distension. There is no tenderness. There is no rebound.  Neurological: She is alert and oriented to person, place, and time.  Skin: Skin is warm and dry. She is not  diaphoretic.  Psychiatric: She has a normal mood and affect.    ED Course  Procedures (including critical care time) Labs Review Labs Reviewed - No data to display  Results for orders placed during the hospital encounter of 11/30/13  PREGNANCY, URINE      Result Value Ref Range   Preg Test, Ur NEGATIVE  NEGATIVE  CBC WITH DIFFERENTIAL      Result Value Ref Range   WBC 10.5  4.0 - 10.5 K/uL   RBC 4.25  3.87 - 5.11 MIL/uL   Hemoglobin 13.5  12.0 - 15.0 g/dL   HCT 16.1  09.6 - 04.5 %   MCV 91.3  78.0 - 100.0 fL   MCH 31.8  26.0 - 34.0 pg   MCHC 34.8  30.0 - 36.0 g/dL   RDW 40.9  81.1 - 91.4 %   Platelets 260  150 - 400 K/uL   Neutrophils Relative % 74  43 - 77 %   Neutro Abs 7.6  1.7 - 7.7 K/uL   Lymphocytes Relative 20  12 - 46 %   Lymphs Abs 2.1  0.7 - 4.0 K/uL   Monocytes Relative 5  3 - 12 %   Monocytes Absolute 0.6  0.1 - 1.0 K/uL   Eosinophils Relative 1  0 - 5 %   Eosinophils Absolute 0.1  0.0 - 0.7 K/uL   Basophils Relative 0  0 - 1 %   Basophils Absolute 0.0  0.0 - 0.1 K/uL  BASIC METABOLIC PANEL      Result Value Ref Range   Sodium 138  137 - 147 mEq/L   Potassium 4.5  3.7 - 5.3 mEq/L   Chloride 103  96 - 112 mEq/L   CO2 24  19 - 32 mEq/L   Glucose, Bld 98  70 - 99 mg/dL   BUN 13  6 - 23 mg/dL   Creatinine, Ser 7.82  0.50 - 1.10 mg/dL   Calcium 9.6  8.4 - 95.6 mg/dL   GFR calc non Af Amer 73 (*) >90 mL/min   GFR calc Af Amer 84 (*) >90 mL/min   Anion gap 11  5 - 15  I-STAT TROPOININ, ED      Result Value Ref Range   Troponin i, poc 0.00  0.00 - 0.08 ng/mL   Comment 3            Dg Chest 2 View  11/30/2013   CLINICAL DATA:  Mid chest pain and shortness of breath; history of asthma appear  EXAM: CHEST  2 VIEW  COMPARISON:  PA and lateral chest x-ray of June 29, 2013.  FINDINGS: The lungs are mildly hyperinflated but clear. The heart and mediastinal structures are normal. There is no pleural effusion or pneumothorax. The bony thorax is unremarkable.   IMPRESSION: There is mild hyperinflation consistent with patient's history of asthma. There is no pneumonia nor other acute cardiopulmonary abnormality.   Electronically Signed   By: David  [place]   On: 11/30/2013 10:34    EKG Interpretation   Date/Time:  Friday November 30 2013 09:18:51 EDT Ventricular Rate:  66 PR Interval:  106 QRS Duration: 79 QT Interval:  394 QTC Calculation: 413 R Axis:   61 Text Interpretation:  Sinus rhythm Short PR interval RSR' in V1 or V2,  right VCD or RVH No significant change since last tracing Confirmed by  Ethelda Chick  MD, [facility] [phone]) on 11/30/2013 9:21:09 AM      MDM   Final diagnoses:  Productive cough  Chest pain, unspecified chest pain type   Patient presents with nonspecific chest discomfort complaints and shortness of breath ongoing for several months. Last episode this morning at 7. Currently asymptomatic in ED. Doubt ACS, PERC negative, SpO2 100% RA, RR 17, able to speak in complete sentences. Questionable reflux.  Dr. Ethelda Chick evaluated the patient during this encounter. EKG without acute findings, negative chest x-ray, negative pregnancy. CBC and BMP without concerning abnormality. Plan to discharge with Pepcid and followup with PCP. Discussed lab results, imaging results, and treatment plan with the patient. Return precautions given. Reports understanding and no other concerns at this time.  Patient is stable for discharge at this time. Meds given in ED:  Medications - No data to display  Discharge Medication List as of 11/30/2013 10:47 AM    START taking these medications   Details  !! albuterol (PROVENTIL) (2.5 MG/3ML) 0.083% nebulizer solution Take 3 mLs (2.5 mg total) by nebulization every 6 (six) hours as needed for wheezing or shortness of breath., Starting 11/30/2013, Until Discontinued, Print    famotidine (PEPCID) 20 MG tablet Take 1 tablet (20 mg total) by mouth 2 (two) times daily., Starting 11/30/2013, Until Discontinued, Print      !! - Potential duplicate medications found. Please discuss with provider.          Mellody Drown, PA-C 11/30/13 1601

## 2013-11-30 NOTE — ED Notes (Signed)
MD at bedside discussing results with patient at this time. Vitals WNL. Patient resting comfortably.

## 2013-11-30 NOTE — ED Notes (Addendum)
Pt reports intermittent central chest burning and cough since March. Pt reports cough thick and orange/pink tinged in color. Pt denies taste of blood with cough. Pt reports PCP reports stress related to event.

## 2013-11-30 NOTE — ED Provider Notes (Signed)
Complains of anterior chest pain radiating to the back onset March 2015 symptoms accompanied by cough productive of brown sputum, mild shortness of breath. Nothing makes symptoms better or worse. Pain is mild to moderate at present. On exam alert no distress. Lungs clear auscultation heart regular rate and rhythm no murmurs rubs abdomen nondistended nontender extremities without edema.  Patient reports she saw her physician at the [facility] 2 weeks ago for same complaint. No diagnostic testing done. States "they didn't tell me anything" PERC neg symptoms highly atypical for acute coronary syndrome.  Doug Sou, MD 11/30/13 1003

## 2013-11-30 NOTE — ED Notes (Signed)
Pt has visitor at the bedside. Pt resting comfortably watching television. Will continue to monitor.

## 2014-07-19 ENCOUNTER — Emergency Department (HOSPITAL_COMMUNITY)
Admission: EM | Admit: 2014-07-19 | Discharge: 2014-07-19 | Disposition: A | Discharge status: Home / Self Care | Payer: Medicare Other | Attending: Emergency Medicine | Admitting: Emergency Medicine

## 2014-07-19 ENCOUNTER — Encounter (HOSPITAL_COMMUNITY): Payer: Self-pay | Admitting: *Deleted

## 2014-07-19 DIAGNOSIS — Z8781 Personal history of (healed) traumatic fracture: Secondary | ICD-10-CM | POA: Insufficient documentation

## 2014-07-19 DIAGNOSIS — R1013 Epigastric pain: Secondary | ICD-10-CM | POA: Insufficient documentation

## 2014-07-19 DIAGNOSIS — R531 Weakness: Secondary | ICD-10-CM | POA: Insufficient documentation

## 2014-07-19 DIAGNOSIS — R61 Generalized hyperhidrosis: Secondary | ICD-10-CM | POA: Insufficient documentation

## 2014-07-19 DIAGNOSIS — R112 Nausea with vomiting, unspecified: Secondary | ICD-10-CM

## 2014-07-19 DIAGNOSIS — J45909 Unspecified asthma, uncomplicated: Secondary | ICD-10-CM | POA: Insufficient documentation

## 2014-07-19 DIAGNOSIS — Z87891 Personal history of nicotine dependence: Secondary | ICD-10-CM | POA: Insufficient documentation

## 2014-07-19 DIAGNOSIS — Z3202 Encounter for pregnancy test, result negative: Secondary | ICD-10-CM | POA: Insufficient documentation

## 2014-07-19 DIAGNOSIS — Z79899 Other long term (current) drug therapy: Secondary | ICD-10-CM | POA: Insufficient documentation

## 2014-07-19 LAB — CBC WITH DIFFERENTIAL/PLATELET
Basophils Absolute: 0 10*3/uL (ref 0.0–0.1)
Basophils Relative: 0 % (ref 0–1)
Eosinophils Absolute: 0.1 10*3/uL (ref 0.0–0.7)
Eosinophils Relative: 1 % (ref 0–5)
HCT: 40.1 % (ref 36.0–46.0)
Hemoglobin: 13.6 g/dL (ref 12.0–15.0)
Lymphocytes Relative: 19 % (ref 12–46)
Lymphs Abs: 2.7 10*3/uL (ref 0.7–4.0)
MCH: 31.6 pg (ref 26.0–34.0)
MCHC: 33.9 g/dL (ref 30.0–36.0)
MCV: 93 fL (ref 78.0–100.0)
Monocytes Absolute: 0.6 10*3/uL (ref 0.1–1.0)
Monocytes Relative: 5 % (ref 3–12)
Neutro Abs: 10.7 10*3/uL — ABNORMAL HIGH (ref 1.7–7.7)
Neutrophils Relative %: 75 % (ref 43–77)
Platelets: 278 10*3/uL (ref 150–400)
RBC: 4.31 MIL/uL (ref 3.87–5.11)
RDW: 12.9 % (ref 11.5–15.5)
WBC: 14.2 10*3/uL — ABNORMAL HIGH (ref 4.0–10.5)

## 2014-07-19 LAB — POC URINE PREG, ED: PREG TEST UR: NEGATIVE

## 2014-07-19 LAB — URINALYSIS, ROUTINE W REFLEX MICROSCOPIC
Bilirubin Urine: NEGATIVE
Glucose, UA: NEGATIVE mg/dL
HGB URINE DIPSTICK: NEGATIVE
Ketones, ur: NEGATIVE mg/dL
Leukocytes, UA: NEGATIVE
Nitrite: NEGATIVE
PROTEIN: NEGATIVE mg/dL
Specific Gravity, Urine: 1.02 (ref 1.005–1.030)
UROBILINOGEN UA: 0.2 mg/dL (ref 0.0–1.0)
pH: 7 (ref 5.0–8.0)

## 2014-07-19 LAB — COMPREHENSIVE METABOLIC PANEL
ALBUMIN: 4.3 g/dL (ref 3.5–5.2)
ALT: 15 U/L (ref 0–35)
AST: 22 U/L (ref 0–37)
Alkaline Phosphatase: 89 U/L (ref 39–117)
Anion gap: 12 (ref 5–15)
BUN: 9 mg/dL (ref 6–23)
CALCIUM: 9.6 mg/dL (ref 8.4–10.5)
CO2: 26 mmol/L (ref 19–32)
Chloride: 104 mmol/L (ref 96–112)
Creatinine, Ser: 0.98 mg/dL (ref 0.50–1.10)
GFR calc Af Amer: >90 mL/min (ref >90)
GFR, EST NON AFRICAN AMERICAN: 79 mL/min — AB (ref >90)
Glucose, Bld: 120 mg/dL — ABNORMAL HIGH (ref 70–99)
Potassium: 3.3 mmol/L — ABNORMAL LOW (ref 3.5–5.1)
Sodium: 142 mmol/L (ref 135–145)
TOTAL PROTEIN: 7.1 g/dL (ref 6.0–8.3)
Total Bilirubin: 0.4 mg/dL (ref 0.3–1.2)

## 2014-07-19 LAB — LIPASE, BLOOD: LIPASE: 23 U/L (ref 11–59)

## 2014-07-19 LAB — CBG MONITORING, ED: GLUCOSE-CAPILLARY: 110 mg/dL — AB (ref 70–99)

## 2014-07-19 MED ORDER — ONDANSETRON HCL 4 MG PO TABS: 4.0000 mg | ORAL_TABLET | Freq: Three times a day (TID) | ORAL | Status: DC | PRN

## 2014-07-19 MED ORDER — METOCLOPRAMIDE HCL 5 MG/ML IJ SOLN
10.0000 mg | Freq: Once | INTRAMUSCULAR | Status: AC
  Administered 2014-07-19: 10 mg via INTRAVENOUS
  Filled 2014-07-19: qty 2

## 2014-07-19 MED ORDER — SODIUM CHLORIDE 0.9 % IV BOLUS (SEPSIS)
1000.0000 mL | Freq: Once | INTRAVENOUS | Status: AC
Start: 2014-07-19 — End: 2014-07-19
  Administered 2014-07-19: 1000 mL via INTRAVENOUS

## 2014-07-19 MED ORDER — SODIUM CHLORIDE 0.9 % IV BOLUS (SEPSIS)
1000.0000 mL | Freq: Once | INTRAVENOUS | Status: AC
  Administered 2014-07-19: 1000 mL via INTRAVENOUS

## 2014-07-19 MED ORDER — HYDROCODONE-ACETAMINOPHEN 5-325 MG PO TABS: 1.0000 | ORAL_TABLET | Freq: Four times a day (QID) | ORAL | Status: DC | PRN

## 2014-07-19 MED ORDER — ONDANSETRON HCL 4 MG/2ML IJ SOLN
4.0000 mg | Freq: Once | INTRAMUSCULAR | Status: AC
  Administered 2014-07-19: 4 mg via INTRAVENOUS
  Filled 2014-07-19: qty 2

## 2014-07-19 MED ORDER — GI COCKTAIL ~~LOC~~
30.0000 mL | Freq: Once | ORAL | Status: AC
  Administered 2014-07-19: 30 mL via ORAL
  Filled 2014-07-19: qty 30

## 2014-07-19 MED ORDER — RANITIDINE HCL 75 MG PO TABS: 75.0000 mg | ORAL_TABLET | Freq: Two times a day (BID) | ORAL | Status: DC

## 2014-07-19 MED ORDER — MORPHINE SULFATE 4 MG/ML IJ SOLN
4.0000 mg | Freq: Once | INTRAMUSCULAR | Status: AC
  Administered 2014-07-19: 4 mg via INTRAVENOUS
  Filled 2014-07-19: qty 1

## 2014-07-19 NOTE — ED Provider Notes (Signed)
CSN: [id number]     Arrival date & time 07/19/14  [id number] History   First MD Initiated Contact with Patient 07/19/14 [phone]     Chief Complaint  Patient presents with  . Abdominal Pain  . Emesis     (Consider location/radiation/quality/duration/timing/severity/associated sxs/prior Treatment) Patient is a 29 y.o. female presenting with abdominal pain and vomiting.  Abdominal Pain Pain location:  Epigastric Pain quality: aching   Pain radiates to:  Does not radiate Pain severity:  Severe Onset quality:  Sudden Duration:  3 hours Timing:  Constant Progression:  Worsening Chronicity:  New Context: awakening from sleep and retching   Context: not sick contacts and not suspicious food intake   Relieved by:  Nothing Associated symptoms: nausea and vomiting (yellow)   Associated symptoms: no constipation, no diarrhea and no fever   Emesis Associated symptoms: abdominal pain   Associated symptoms: no diarrhea     Past Medical History  Diagnosis Date  . Asthma   . Hip fracture, right 2010   History reviewed. No pertinent past surgical history. No family history on file. History  Substance Use Topics  . Smoking status: Former Smoker    Quit date: 04/05/2010  . Smokeless tobacco: Never Used  . Alcohol Use: No   OB History    No data available     Review of Systems  Constitutional: Negative for fever.  Gastrointestinal: Positive for nausea, vomiting (yellow) and abdominal pain. Negative for diarrhea and constipation.  All other systems reviewed and are negative.     Allergies  Review of patient's allergies indicates no known allergies.  Home Medications   Prior to Admission medications   Medication Sig Start Date End Date Taking? Authorizing Provider  albuterol (PROVENTIL HFA;VENTOLIN HFA) 108 (90 BASE) MCG/ACT inhaler Inhale 1-2 puffs into the lungs every 6 (six) hours as needed for wheezing or shortness of breath.    Historical Provider, MD  albuterol (PROVENTIL)  (2.5 MG/3ML) 0.083% nebulizer solution Take 2.5 mg by nebulization every 6 (six) hours as needed for wheezing or shortness of breath.    Historical Provider, MD  albuterol (PROVENTIL) (2.5 MG/3ML) 0.083% nebulizer solution Take 3 mLs (2.5 mg total) by nebulization every 6 (six) hours as needed for wheezing or shortness of breath. 11/30/13   Mellody DrownLauren Parker, PA-C  famotidine (PEPCID) 20 MG tablet Take 1 tablet (20 mg total) by mouth 2 (two) times daily. 11/30/13   Lauren Parker, PA-C   BP 148/89 mmHg  Pulse 70  Temp(Src) 97.4 F (36.3 C) (Oral)  Wt 155 lb (70.308 kg)  SpO2 100% Physical Exam  Constitutional: She is oriented to person, place, and time. She appears well-developed and well-nourished.  Generalized weakness  HENT:  Head: Normocephalic and atraumatic.  Mouth/Throat: Oropharynx is clear and moist.  Eyes: Conjunctivae are normal. Pupils are equal, round, and reactive to light. No scleral icterus.  Neck: Neck supple.  Cardiovascular: Normal rate, regular rhythm, normal heart sounds and intact distal pulses.   No murmur heard. Pulmonary/Chest: Effort normal and breath sounds normal. No stridor. No respiratory distress. She has no rales.  Abdominal: Soft. Bowel sounds are normal. She exhibits no distension. There is no tenderness. There is no rebound and no guarding.  Musculoskeletal: Normal range of motion.  Neurological: She is alert and oriented to person, place, and time.  Skin: Skin is warm. No rash noted. She is diaphoretic.  Psychiatric: She has a normal mood and affect. Her behavior is normal.  Nursing note and vitals  reviewed.   ED Course  Procedures (including critical care time) Labs Review Labs Reviewed  CBC WITH DIFFERENTIAL/PLATELET - Abnormal; Notable for the following:    WBC 14.2 (*)    Neutro Abs 10.7 (*)    All other components within normal limits  COMPREHENSIVE METABOLIC PANEL - Abnormal; Notable for the following:    Potassium 3.3 (*)    Glucose, Bld 120  (*)    GFR calc non Af Amer 79 (*)    All other components within normal limits  CBG MONITORING, ED - Abnormal; Notable for the following:    Glucose-Capillary 110 (*)    All other components within normal limits  URINALYSIS, ROUTINE W REFLEX MICROSCOPIC  LIPASE, BLOOD  POC URINE PREG, ED    Imaging Review No results found.   EKG Interpretation   Date/Time:  Friday July 19 2014 09:24:44 EDT Ventricular Rate:  55 PR Interval:  124 QRS Duration: 90 QT Interval:  445 QTC Calculation: 426 R Axis:   42 Text Interpretation:  Sinus rhythm Borderline T wave abnormalities  Borderline ST elevation, lateral leads similar to prior Confirmed by  [facility]  MD, TREY ([id number]) on 07/19/2014 9:56:22 AM      MDM   Final diagnoses:  Non-intractable vomiting with nausea, vomiting of unspecified type  Epigastric abdominal pain    29 yo female with vomiting and abdominal pain.  On initial exam, appeared nauseated, diaphoretic, and uncomfortable.  She required IV fluids, multiple antiemetics, a dose of IV morphine, and GI cocktail, but ultimately felt improved.  Abdominal exam remained benign, with epigastric abdominal tenderness but no RUQ or lower abdominal tenderness.  She tolerated PO fluids.  She felt comfortable with plan to discharge.  She will return if symptoms worsen.  I suspect gastritis.  I don't think imaging would be helpful based on her history and exam.  Treat with pain meds, zantac, zofran.      Blake Divine, MD 07/19/14 [phone]

## 2014-07-19 NOTE — ED Notes (Signed)
Patient reports onset of mid abd pain today.  She has had ongoing n/v.  No diarrhea.  Normal bm today.  She is restless.  Denies any urinary sx.  lmp was last week.  She is slightly diaphoretic

## 2014-07-19 NOTE — Discharge Instructions (Signed)
Abdominal Pain, Women °Abdominal (stomach, pelvic, or belly) pain can be caused by many things. It is important to tell your doctor: °· The location of the pain. °· Does it come and go or is it present all the time? °· Are there things that start the pain (eating certain foods, exercise)? °· Are there other symptoms associated with the pain (fever, nausea, vomiting, diarrhea)? °All of this is helpful to know when trying to find the cause of the pain. °CAUSES  °· Stomach: virus or bacteria infection, or ulcer. °· Intestine: appendicitis (inflamed appendix), regional ileitis (Crohn's disease), ulcerative colitis (inflamed colon), irritable bowel syndrome, diverticulitis (inflamed diverticulum of the colon), or cancer of the stomach or intestine. °· Gallbladder disease or stones in the gallbladder. °· Kidney disease, kidney stones, or infection. °· Pancreas infection or cancer. °· Fibromyalgia (pain disorder). °· Diseases of the female organs: °¨ Uterus: fibroid (non-cancerous) tumors or infection. °¨ Fallopian tubes: infection or tubal pregnancy. °¨ Ovary: cysts or tumors. °¨ Pelvic adhesions (scar tissue). °¨ Endometriosis (uterus lining tissue growing in the pelvis and on the pelvic organs). °¨ Pelvic congestion syndrome (female organs filling up with blood just before the menstrual period). °¨ Pain with the menstrual period. °¨ Pain with ovulation (producing an egg). °¨ Pain with an IUD (intrauterine device, birth control) in the uterus. °¨ Cancer of the female organs. °· Functional pain (pain not caused by a disease, may improve without treatment). °· Psychological pain. °· Depression. °DIAGNOSIS  °Your doctor will decide the seriousness of your pain by doing an examination. °· Blood tests. °· X-rays. °· Ultrasound. °· CT scan (computed tomography, special type of X-ray). °· MRI (magnetic resonance imaging). °· Cultures, for infection. °· Barium enema (dye inserted in the large intestine, to better view it with  X-rays). °· Colonoscopy (looking in intestine with a lighted tube). °· Laparoscopy (minor surgery, looking in abdomen with a lighted tube). °· Major abdominal exploratory surgery (looking in abdomen with a large incision). °TREATMENT  °The treatment will depend on the cause of the pain.  °· Many cases can be observed and treated at home. °· Over-the-counter medicines recommended by your caregiver. °· Prescription medicine. °· Antibiotics, for infection. °· Birth control pills, for painful periods or for ovulation pain. °· Hormone treatment, for endometriosis. °· Nerve blocking injections. °· Physical therapy. °· Antidepressants. °· Counseling with a psychologist or psychiatrist. °· Minor or major surgery. °HOME CARE INSTRUCTIONS  °· Do not take laxatives, unless directed by your caregiver. °· Take over-the-counter pain medicine only if ordered by your caregiver. Do not take aspirin because it can cause an upset stomach or bleeding. °· Try a clear liquid diet (broth or water) as ordered by your caregiver. Slowly move to a bland diet, as tolerated, if the pain is related to the stomach or intestine. °· Have a thermometer and take your temperature several times a day, and record it. °· Bed rest and sleep, if it helps the pain. °· Avoid sexual intercourse, if it causes pain. °· Avoid stressful situations. °· Keep your follow-up appointments and tests, as your caregiver orders. °· If the pain does not go away with medicine or surgery, you may try: °¨ Acupuncture. °¨ Relaxation exercises (yoga, meditation). °¨ Group therapy. °¨ Counseling. °SEEK MEDICAL CARE IF:  °· You notice certain foods cause stomach pain. °· Your home care treatment is not helping your pain. °· You need stronger pain medicine. °· You want your IUD removed. °· You feel faint or   lightheaded. °· You develop nausea and vomiting. °· You develop a rash. °· You are having side effects or an allergy to your medicine. °SEEK IMMEDIATE MEDICAL CARE IF:  °· Your  pain does not go away or gets worse. °· You have a fever. °· Your pain is felt only in portions of the abdomen. The right side could possibly be appendicitis. The left lower portion of the abdomen could be colitis or diverticulitis. °· You are passing blood in your stools (bright red or black tarry stools, with or without vomiting). °· You have blood in your urine. °· You develop chills, with or without a fever. °· You pass out. °MAKE SURE YOU:  °· Understand these instructions. °· Will watch your condition. °· Will get help right away if you are not doing well or get worse. °Document Released: 01/17/2007 Document Revised: 08/06/2013 Document Reviewed: 02/06/2009 °Exit[employer]® Patient Information ©2015 [employer]. This information is not intended to replace advice given to you by your health care provider. Make sure you discuss any questions you have with your health care provider. ° °

## 2015-01-24 ENCOUNTER — Other Ambulatory Visit: Payer: Self-pay

## 2015-01-24 DIAGNOSIS — Z1231 Encounter for screening mammogram for malignant neoplasm of breast: Secondary | ICD-10-CM

## 2015-01-31 ENCOUNTER — Ambulatory Visit: Payer: Medicare Other

## 2015-04-17 ENCOUNTER — Emergency Department (HOSPITAL_COMMUNITY): Payer: Medicare Other

## 2015-04-17 ENCOUNTER — Encounter (HOSPITAL_COMMUNITY): Payer: Self-pay | Admitting: Emergency Medicine

## 2015-04-17 ENCOUNTER — Emergency Department (HOSPITAL_COMMUNITY)
Admission: EM | Admit: 2015-04-17 | Discharge: 2015-04-17 | Disposition: A | Discharge status: Home / Self Care | Payer: Medicare Other | Attending: Emergency Medicine | Admitting: Emergency Medicine

## 2015-04-17 DIAGNOSIS — Z8781 Personal history of (healed) traumatic fracture: Secondary | ICD-10-CM | POA: Insufficient documentation

## 2015-04-17 DIAGNOSIS — Z79899 Other long term (current) drug therapy: Secondary | ICD-10-CM | POA: Insufficient documentation

## 2015-04-17 DIAGNOSIS — J45909 Unspecified asthma, uncomplicated: Secondary | ICD-10-CM | POA: Insufficient documentation

## 2015-04-17 DIAGNOSIS — Z87891 Personal history of nicotine dependence: Secondary | ICD-10-CM | POA: Insufficient documentation

## 2015-04-17 DIAGNOSIS — J40 Bronchitis, not specified as acute or chronic: Secondary | ICD-10-CM

## 2015-04-17 DIAGNOSIS — Z3202 Encounter for pregnancy test, result negative: Secondary | ICD-10-CM | POA: Insufficient documentation

## 2015-04-17 LAB — CBC
HCT: 40.7 % (ref 36.0–46.0)
HEMOGLOBIN: 13.5 g/dL (ref 12.0–15.0)
MCH: 30.4 pg (ref 26.0–34.0)
MCHC: 33.2 g/dL (ref 30.0–36.0)
MCV: 91.7 fL (ref 78.0–100.0)
Platelets: 326 10*3/uL (ref 150–400)
RBC: 4.44 MIL/uL (ref 3.87–5.11)
RDW: 11.8 % (ref 11.5–15.5)
WBC: 9.2 10*3/uL (ref 4.0–10.5)

## 2015-04-17 LAB — BASIC METABOLIC PANEL
Anion gap: 10 (ref 5–15)
BUN: 8 mg/dL (ref 6–20)
CALCIUM: 9.5 mg/dL (ref 8.9–10.3)
CO2: 28 mmol/L (ref 22–32)
Chloride: 102 mmol/L (ref 101–111)
Creatinine, Ser: 0.85 mg/dL (ref 0.44–1.00)
GFR calc non Af Amer: >60 mL/min (ref >60)
GLUCOSE: 103 mg/dL — AB (ref 65–99)
Potassium: 4.1 mmol/L (ref 3.5–5.1)
Sodium: 140 mmol/L (ref 135–145)

## 2015-04-17 LAB — POC URINE PREG, ED: Preg Test, Ur: NEGATIVE

## 2015-04-17 LAB — I-STAT TROPONIN, ED: TROPONIN I, POC: 0 ng/mL (ref 0.00–0.08)

## 2015-04-17 MED ORDER — BENZONATATE 100 MG PO CAPS: 100.0000 mg | ORAL_CAPSULE | Freq: Three times a day (TID) | ORAL | Status: DC

## 2015-04-17 MED ORDER — AZITHROMYCIN 250 MG PO TABS
500.0000 mg | ORAL_TABLET | Freq: Once | ORAL | Status: AC
  Administered 2015-04-17: 500 mg via ORAL
  Filled 2015-04-17: qty 2

## 2015-04-17 MED ORDER — IBUPROFEN 800 MG PO TABS: 800.0000 mg | ORAL_TABLET | Freq: Three times a day (TID) | ORAL | Status: DC

## 2015-04-17 MED ORDER — AZITHROMYCIN 250 MG PO TABS: 250.0000 mg | ORAL_TABLET | Freq: Every day | ORAL | Status: DC

## 2015-04-17 MED ORDER — IBUPROFEN 800 MG PO TABS
800.0000 mg | ORAL_TABLET | Freq: Once | ORAL | Status: AC
  Administered 2015-04-17: 800 mg via ORAL
  Filled 2015-04-17: qty 1

## 2015-04-17 MED ORDER — BENZONATATE 100 MG PO CAPS
100.0000 mg | ORAL_CAPSULE | Freq: Once | ORAL | Status: AC
  Administered 2015-04-17: 100 mg via ORAL
  Filled 2015-04-17: qty 1

## 2015-04-17 NOTE — ED Provider Notes (Signed)
CSN: [id number]     Arrival date & time 04/17/15  0944 History   First MD Initiated Contact with Patient 04/17/15 1141     Chief Complaint  Patient presents with  . Chest Pain  . URI   (Consider location/radiation/quality/duration/timing/severity/associated sxs/prior Treatment) Patient is a 30 y.o. female presenting with chest pain and URI. The history is provided by the patient. No language interpreter was used.  Chest Pain Associated symptoms: cough   Associated symptoms: no shortness of breath   URI Presenting symptoms: cough   Associated symptoms: no wheezing    Brittney Khan is a 30 year old female with a past medical history of asthma and right hip fracture who presents for constant sternal chest tightness for the past 4-5 days with a green sputum cough. She also reports there has been blood clots in the sputum. Worse with inspiration. She has been taking over-the-counter cough medication with little relief. She also tried her albuterol nebulizer with little relief. She denies having an albuterol rescue inhaler. She states that she hardly uses the neb. She states that she was seen at urgent care and by her PCP who advised her to come to the ED and did not treat her with any medication. She denies smoking in the last couple of years. She denies any fever, chills, shortness of breath, wheezing, abdominal pain.  Past Medical History  Diagnosis Date  . Asthma   . Hip fracture, right (HCC) 2010   History reviewed. No pertinent past surgical history. No family history on file. Social History  Substance Use Topics  . Smoking status: Former Smoker    Quit date: 04/05/2010  . Smokeless tobacco: Never Used  . Alcohol Use: Yes     Comment: socially   OB History    No data available     Review of Systems  Respiratory: Positive for cough and chest tightness. Negative for shortness of breath and wheezing.   Cardiovascular: Positive for chest pain.  All other systems reviewed and  are negative.     Allergies  Review of patient's allergies indicates no known allergies.  Home Medications   Prior to Admission medications   Medication Sig Start Date End Date Taking? Authorizing Provider  albuterol (PROVENTIL HFA;VENTOLIN HFA) 108 (90 BASE) MCG/ACT inhaler Inhale 1-2 puffs into the lungs every 6 (six) hours as needed for wheezing or shortness of breath.    Historical Provider, MD  albuterol (PROVENTIL) (2.5 MG/3ML) 0.083% nebulizer solution Take 2.5 mg by nebulization every 6 (six) hours as needed for wheezing or shortness of breath.    Historical Provider, MD  albuterol (PROVENTIL) (2.5 MG/3ML) 0.083% nebulizer solution Take 3 mLs (2.5 mg total) by nebulization every 6 (six) hours as needed for wheezing or shortness of breath. Patient not taking: Reported on 04/17/2015 11/30/13   Mellody Drown, PA-C  azithromycin (ZITHROMAX) 250 MG tablet Take 1 tablet (250 mg total) by mouth daily. Take 1 every day until finished. You received her first dose in the ED. 04/18/15   Catha Gosselin, PA-C  benzonatate (TESSALON) 100 MG capsule Take 1 capsule (100 mg total) by mouth every 8 (eight) hours. 04/17/15   Sherissa Tenenbaum Patel-Mills, PA-C  famotidine (PEPCID) 20 MG tablet Take 1 tablet (20 mg total) by mouth 2 (two) times daily. Patient not taking: Reported on 04/17/2015 11/30/13   Mellody Drown, PA-C  HYDROcodone-acetaminophen (NORCO/VICODIN) 5-325 MG per tablet Take 1-2 tablets by mouth every 6 (six) hours as needed for severe pain. Patient not taking: Reported on  04/17/2015 07/19/14   Blake Divine, MD  ibuprofen (ADVIL,MOTRIN) 800 MG tablet Take 1 tablet (800 mg total) by mouth 3 (three) times daily. 04/17/15   Tarissa Kerin Patel-Mills, PA-C  ondansetron (ZOFRAN) 4 MG tablet Take 1 tablet (4 mg total) by mouth every 8 (eight) hours as needed for nausea or vomiting. Patient not taking: Reported on 04/17/2015 07/19/14   Blake Divine, MD  ranitidine (ZANTAC) 75 MG tablet Take 1 tablet (75 mg total) by mouth  2 (two) times daily. Patient not taking: Reported on 04/17/2015 07/19/14   Blake Divine, MD   BP 125/82 mmHg  Pulse 70  Temp(Src) 97.9 F (36.6 C) (Oral)  Resp 22  Ht 5\' 5"  (1.651 m)  Wt 86.183 kg  BMI 31.62 kg/m2  SpO2 100%  LMP 04/06/2015 Physical Exam  Constitutional: She is oriented to person, place, and time. She appears well-developed and well-nourished.  Well-appearing and in no acute distress.  HENT:  Head: Normocephalic and atraumatic.  Eyes: Conjunctivae are normal.  Neck: Normal range of motion. Neck supple.  Cardiovascular: Normal rate, regular rhythm and normal heart sounds.   Regular rate and rhythm. No murmurs rubs or gallops.  Pulmonary/Chest: Effort normal and breath sounds normal. No respiratory distress. She has no wheezes. She has no rales. She exhibits no tenderness.  Lungs clear to auscultation bilaterally. No wheezing or decreased breath sounds. No respiratory distress or use of accessory muscles. No chest tenderness.  Abdominal: There is no tenderness.  Musculoskeletal: Normal range of motion.  Neurological: She is alert and oriented to person, place, and time.  Skin: Skin is warm and dry.  Nursing note and vitals reviewed.   ED Course  Procedures (including critical care time) Labs Review Labs Reviewed  BASIC METABOLIC PANEL - Abnormal; Notable for the following:    Glucose, Bld 103 (*)    All other components within normal limits  CBC  I-STAT TROPOININ, ED  POC URINE PREG, ED    Imaging Review Dg Chest 2 View  04/17/2015  CLINICAL DATA:  Mid chest pain and cough for few days, history asthma, former smoker EXAM: CHEST  2 VIEW COMPARISON:  12/31/2013 FINDINGS: Normal heart size, mediastinal contours, and pulmonary vascularity. Mild chronic bronchitic changes with linear subsegmental at lingula. Lungs otherwise clear. No pleural effusion or pneumothorax. No acute bony abnormalities. IMPRESSION: Chronic bronchitic changes with minimal lingular  atelectasis. Electronically Signed   By: Ulyses Southward M.D.   On: 04/17/2015 10:32   I have personally reviewed and evaluated these images and lab results as part of my medical decision-making.   EKG Interpretation   Date/Time:  Thursday April 17 2015 09:56:05 EST Ventricular Rate:  72 PR Interval:  114 QRS Duration: 78 QT Interval:  396 QTC Calculation: 433 R Axis:   83 Text Interpretation:  Normal sinus rhythm with sinus arrhythmia  Nonspecific T wave abnormality Abnormal ECG No significant change since  last tracing Confirmed by FLOYD MD, DANIEL ([id number]) on 04/17/2015 10:01:07  AM      MDM   Final diagnoses:  Bronchitis  Patient presents for chest pain and productive blood and green sputum cough 4-5 days. Vital stable. Patient is afebrile 97% oxygen on room air. Well-appearing and in no acute respiratory distress. Troponin is negative. Her EKG is normal sinus rhythm and no concerns for ACS. Labs are unremarkable. She has no shortness of breath and no wheezing on exam. Chest x-ray shows chronic bronchitic changes.  Patient was treated with azithromycin, Tessalon, and ibuprofen.  I discussed follow-up with her PCP within 48 hours. I also discussed return precautions with the patient and she agrees with the plan.  Filed Vitals:   04/17/15 1300 04/17/15 1315  BP: 126/79 125/82  Pulse: 68 70  Temp:    Resp: 22 [id number]       Tyhesha Dutson Patel-Mills, PA-C 04/17/15 1335  Gwyneth SproutWhitney Plunkett, MD 04/17/15 2132

## 2015-04-17 NOTE — Discharge Instructions (Signed)
Upper Respiratory Infection, Adult Follow-up with your primary care provider. Return for increased chest pain or shortness of breath, fever, or continued bloody sputum. Most upper respiratory infections (URIs) are caused by a virus. A URI affects the nose, throat, and upper air passages. The most common type of URI is often called "the common cold." HOME CARE   Take medicines only as told by your doctor.  Gargle warm saltwater or take cough drops to comfort your throat as told by your doctor.  Use a warm mist humidifier or inhale steam from a shower to increase air moisture. This may make it easier to breathe.  Drink enough fluid to keep your pee (urine) clear or pale yellow.  Eat soups and other clear broths.  Have a healthy diet.  Rest as needed.  Go back to work when your fever is gone or your doctor says it is okay.  You may need to stay home longer to avoid giving your URI to others.  You can also wear a face mask and wash your hands often to prevent spread of the virus.  Use your inhaler more if you have asthma.  Do not use any tobacco products, including cigarettes, chewing tobacco, or electronic cigarettes. If you need help quitting, ask your doctor. GET HELP IF:  You are getting worse, not better.  Your symptoms are not helped by medicine.  You have chills.  You are getting more short of breath.  You have brown or red mucus.  You have yellow or brown discharge from your nose.  You have pain in your face, especially when you bend forward.  You have a fever.  You have puffy (swollen) neck glands.  You have pain while swallowing.  You have white areas in the back of your throat. GET HELP RIGHT AWAY IF:   You have very bad or constant:  Headache.  Ear pain.  Pain in your forehead, behind your eyes, and over your cheekbones (sinus pain).  Chest pain.  You have long-lasting (chronic) lung disease and any of the following:  Wheezing.  Long-lasting  cough.  Coughing up blood.  A change in your usual mucus.  You have a stiff neck.  You have changes in your:  Vision.  Hearing.  Thinking.  Mood. MAKE SURE YOU:   Understand these instructions.  Will watch your condition.  Will get help right away if you are not doing well or get worse.   This information is not intended to replace advice given to you by your health care provider. Make sure you discuss any questions you have with your health care provider.   Document Released: 09/08/2007 Document Revised: 08/06/2014 Document Reviewed: 06/27/2013 Elsevier Interactive Patient Education [employer]2016 [employer]

## 2015-04-17 NOTE — ED Notes (Signed)
Patient states chest pain x 2 months.   Patient states constant.  Patient states chest congestion with productive cough.  Patient states has been coughing up blood periodically.

## 2015-08-06 ENCOUNTER — Encounter: Payer: Self-pay | Admitting: Family Medicine

## 2015-08-06 ENCOUNTER — Ambulatory Visit (INDEPENDENT_AMBULATORY_CARE_PROVIDER_SITE_OTHER): Payer: Self-pay | Admitting: Family Medicine

## 2015-08-06 VITALS — BP 122/85 | HR 65 | Ht 66.0 in | Wt 190.0 lb

## 2015-08-06 DIAGNOSIS — M25562 Pain in left knee: Secondary | ICD-10-CM | POA: Insufficient documentation

## 2015-08-06 MED ORDER — METHYLPREDNISOLONE ACETATE 40 MG/ML IJ SUSP
40.0000 mg | Freq: Once | INTRAMUSCULAR | Status: AC
  Administered 2015-08-06: 40 mg via INTRA_ARTICULAR

## 2015-08-06 NOTE — Assessment & Plan Note (Signed)
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  Treat for both.  Icing, nsaids, shown home exercises to do daily and will start physical therapy.  She opted for intraarticular injection today - discussed if PF syndrome is the primary issues injection will not help.  Arch supports discussed also.  F/u in 6 weeks.  After informed written consent, patient was seated on exam table. Left knee was prepped with alcohol swab and utilizing anteromedial approach, patient's left knee was injected intraarticularly with 3:1 marcaine: depomedrol. Patient tolerated the procedure well without immediate complications.

## 2015-08-06 NOTE — Progress Notes (Signed)
PCP: Default, Provider, MD  Subjective:   HPI: Patient is a 30 y.o. female here for left knee pain.  Patient reports she's had 6 weeks of worsening left knee pain. No acute injury. Pain level is 8/10 and sharp, radiates anterior to posterior. Tried tylenol, ibuprofen with mild benefit. Worse with walking. No skin changes, swelling, numbness.  Past Medical History  Diagnosis Date  . Asthma   . Hip fracture, right (HCC) 2010    Current Outpatient Prescriptions on File Prior to Visit  Medication Sig Dispense Refill  . albuterol (PROVENTIL HFA;VENTOLIN HFA) 108 (90 BASE) MCG/ACT inhaler Inhale 1-2 puffs into the lungs every 6 (six) hours as needed for wheezing or shortness of breath.    Marland Kitchen. albuterol (PROVENTIL) (2.5 MG/3ML) 0.083% nebulizer solution Take 3 mLs (2.5 mg total) by nebulization every 6 (six) hours as needed for wheezing or shortness of breath. (Patient not taking: Reported on 04/17/2015) 75 mL 0  . famotidine (PEPCID) 20 MG tablet Take 1 tablet (20 mg total) by mouth 2 (two) times daily. (Patient not taking: Reported on 04/17/2015) 60 tablet 0  . ranitidine (ZANTAC) 75 MG tablet Take 1 tablet (75 mg total) by mouth 2 (two) times daily. (Patient not taking: Reported on 04/17/2015) 30 tablet 0   No current facility-administered medications on file prior to visit.    No past surgical history on file.  No Known Allergies  Social History   Social History  . Marital Status: Single    Spouse Name: N/A  . Number of Children: N/A  . Years of Education: N/A   Occupational History  . Not on file.   Social History Main Topics  . Smoking status: Former Smoker    Quit date: 04/05/2010  . Smokeless tobacco: Never Used  . Alcohol Use: 0.0 oz/week    0 Standard drinks or equivalent per week     Comment: socially  . Drug Use: No  . Sexual Activity: Yes   Other Topics Concern  . Not on file   Social History Narrative    No family history on file.  BP 122/85 mmHg   Pulse 65  Ht 5\' 6"  (1.676 m)  Wt 190 lb (86.183 kg)  BMI 30.68 kg/m2  Review of Systems: See HPI above.    Objective:  Physical Exam:  Gen: NAD, comfortable in exam room  Left knee: No gross deformity, ecchymoses, effusion. VMO atrophy Pes planus. TTP medial joint line.  No other tenderness. FROM - patellar shift with small click, painful with flexion to extension. Negative ant/post drawers. Negative valgus/varus testing. Negative lachmanns. Pain with mcmurrays, thessalys, apleys.  Negative patellar apprehension. Hip abduction 5/5 NV intact distally.  Right knee: FROM without pain.    Assessment & Plan:  1. 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  Treat for both.  Icing, nsaids, shown home exercises to do daily and will start physical therapy.  She opted for intraarticular injection today - discussed if PF syndrome is the primary issues injection will not help.  Arch supports discussed also.  F/u in 6 weeks.  After informed written consent, patient was seated on exam table. Left knee was prepped with alcohol swab and utilizing anteromedial approach, patient's left knee was injected intraarticularly with 3:1 marcaine: depomedrol. Patient tolerated the procedure well without immediate complications.

## 2015-08-06 NOTE — Patient Instructions (Signed)
Your pain is due to patellofemoral syndrome and/or a medial meniscus tear. Avoid painful activities when possible (often deep squats, lunges bother this) Cross train with swimming, cycling with low resistance, elliptical if needed. Straight leg raise, hip side raises, straight leg raises with foot turned outwards 3 sets of 10 once a day. Add ankle weight if thesee become too easy. Start physical therapy Consider dr. Jari Sportsmanscholls active series or something similar to support your arches - having flat feet makes this condition worse without support. Avoid flat shoes, barefoot walking as much as possible the next 6 weeks. Icing 15 minutes at a time 3-4 times a day as needed. Tylenol or aleve as needed for pain You were given a cortisone shot today. Follow up with me in 6 weeks.

## 2015-08-29 ENCOUNTER — Encounter: Payer: Self-pay | Admitting: Family Medicine

## 2015-08-29 ENCOUNTER — Ambulatory Visit (INDEPENDENT_AMBULATORY_CARE_PROVIDER_SITE_OTHER): Payer: Self-pay | Admitting: Family Medicine

## 2015-08-29 VITALS — BP 137/89 | HR 78 | Ht 66.0 in | Wt 190.0 lb

## 2015-08-29 DIAGNOSIS — M25562 Pain in left knee: Secondary | ICD-10-CM

## 2015-08-29 MED ORDER — TRAMADOL HCL 50 MG PO TABS: 50.0000 mg | ORAL_TABLET | Freq: Four times a day (QID) | ORAL | Status: DC | PRN

## 2015-08-29 MED ORDER — MELOXICAM 15 MG PO TABS: 15.0000 mg | ORAL_TABLET | Freq: Every day | ORAL | Status: DC

## 2015-08-29 NOTE — Patient Instructions (Addendum)
Bring us your insurance card asap so we can order an MRI of your knee. Try meloxicam 15 mg daily with food for pain and inflammation. Tramadol as needed for severe pain (no driving on this).

## 2015-09-02 NOTE — Progress Notes (Signed)
PCP: Default, Provider, MD  Subjective:   HPI: Patient is a 30 y.o. female here for left knee pain.  5/3: Patient reports she's had 6 weeks of worsening left knee pain. No acute injury. Pain level is 8/10 and sharp, radiates anterior to posterior. Tried tylenol, ibuprofen with mild benefit. Worse with walking. No skin changes, swelling, numbness.  5/26: Patient reports she continues to have problems with her left knee. Pain level 8/10 and sharp, still anterior to posterior radiation. Pain is constant. Wakes her up at night. Has been icing. Not noticed benefit with injection. Knee is giving out. No skin changes, numbness.  Past Medical History  Diagnosis Date  . Asthma   . Hip fracture, right (HCC) 2010    Current Outpatient Prescriptions on File Prior to Visit  Medication Sig Dispense Refill  . albuterol (PROVENTIL HFA;VENTOLIN HFA) 108 (90 BASE) MCG/ACT inhaler Inhale 1-2 puffs into the lungs every 6 (six) hours as needed for wheezing or shortness of breath.    Marland Kitchen. albuterol (PROVENTIL) (2.5 MG/3ML) 0.083% nebulizer solution Take 3 mLs (2.5 mg total) by nebulization every 6 (six) hours as needed for wheezing or shortness of breath. (Patient not taking: Reported on 04/17/2015) 75 mL 0  . famotidine (PEPCID) 20 MG tablet Take 1 tablet (20 mg total) by mouth 2 (two) times daily. (Patient not taking: Reported on 04/17/2015) 60 tablet 0  . ranitidine (ZANTAC) 75 MG tablet Take 1 tablet (75 mg total) by mouth 2 (two) times daily. (Patient not taking: Reported on 04/17/2015) 30 tablet 0   No current facility-administered medications on file prior to visit.    No past surgical history on file.  No Known Allergies  Social History   Social History  . Marital Status: Single    Spouse Name: N/A  . Number of Children: N/A  . Years of Education: N/A   Occupational History  . Not on file.   Social History Main Topics  . Smoking status: Former Smoker    Quit date: 04/05/2010  .  Smokeless tobacco: Never Used  . Alcohol Use: 0.0 oz/week    0 Standard drinks or equivalent per week     Comment: socially  . Drug Use: No  . Sexual Activity: Yes   Other Topics Concern  . Not on file   Social History Narrative    No family history on file.  BP 137/89 mmHg  Pulse 78  Ht 5\' 6"  (1.676 m)  Wt 190 lb (86.183 kg)  BMI 30.68 kg/m2  Review of Systems: See HPI above.    Objective:  Physical Exam:  Gen: NAD, comfortable in exam room  Left knee: No gross deformity, ecchymoses, effusion. VMO atrophy Pes planus. TTP medial joint line.  No other tenderness. FROM - patellar shift with small click, painful with flexion to extension. Negative ant/post drawers. Negative valgus/varus testing. Negative lachmanns. Pain with mcmurrays, thessalys.  Negative patellar apprehension. NV intact distally.  Right knee: FROM without pain.    Assessment & Plan:  1. Left knee pain - No benefit with injection and has increasing giving out, constant pain of this knee higher than I would expect with pure patellofemoral syndrome.  Advised we go ahead with MRI to assess for meniscus tear - she is going to get us an updated insurance card so we can get approval for this.  Icing, nsaids, continue home exercises in meantime.  Reports she had been doing these but seemed to hurt worse.

## 2015-09-02 NOTE — Assessment & Plan Note (Signed)
No benefit with injection and has increasing giving out, constant pain of this knee higher than I would expect with pure patellofemoral syndrome.  Advised we go ahead with MRI to assess for meniscus tear - she is going to get us an updated insurance card so we can get approval for this.  Icing, nsaids, continue home exercises in meantime.  Reports she had been doing these but seemed to hurt worse.

## 2016-05-19 ENCOUNTER — Emergency Department (HOSPITAL_BASED_OUTPATIENT_CLINIC_OR_DEPARTMENT_OTHER)
Admission: EM | Admit: 2016-05-19 | Discharge: 2016-05-19 | Disposition: A | Discharge status: Home / Self Care | Payer: Medicare Other | Attending: Emergency Medicine | Admitting: Emergency Medicine

## 2016-05-19 ENCOUNTER — Encounter (HOSPITAL_BASED_OUTPATIENT_CLINIC_OR_DEPARTMENT_OTHER): Payer: Self-pay | Admitting: Emergency Medicine

## 2016-05-19 DIAGNOSIS — Z79899 Other long term (current) drug therapy: Secondary | ICD-10-CM | POA: Insufficient documentation

## 2016-05-19 DIAGNOSIS — J45909 Unspecified asthma, uncomplicated: Secondary | ICD-10-CM | POA: Insufficient documentation

## 2016-05-19 DIAGNOSIS — Z87891 Personal history of nicotine dependence: Secondary | ICD-10-CM | POA: Insufficient documentation

## 2016-05-19 DIAGNOSIS — K2900 Acute gastritis without bleeding: Secondary | ICD-10-CM | POA: Insufficient documentation

## 2016-05-19 LAB — COMPREHENSIVE METABOLIC PANEL
ALBUMIN: 3.6 g/dL (ref 3.5–5.0)
ALK PHOS: 93 U/L (ref 38–126)
ALT: 10 U/L — ABNORMAL LOW (ref 14–54)
AST: 14 U/L — AB (ref 15–41)
Anion gap: 8 (ref 5–15)
BILIRUBIN TOTAL: 0.3 mg/dL (ref 0.3–1.2)
BUN: 14 mg/dL (ref 6–20)
CO2: 26 mmol/L (ref 22–32)
Calcium: 9.3 mg/dL (ref 8.9–10.3)
Chloride: 106 mmol/L (ref 101–111)
Creatinine, Ser: 0.93 mg/dL (ref 0.44–1.00)
GFR calc Af Amer: >60 mL/min (ref >60)
GLUCOSE: 100 mg/dL — AB (ref 65–99)
Potassium: 4.1 mmol/L (ref 3.5–5.1)
Sodium: 140 mmol/L (ref 135–145)
TOTAL PROTEIN: 6.4 g/dL — AB (ref 6.5–8.1)

## 2016-05-19 LAB — CBC WITH DIFFERENTIAL/PLATELET
BASOS ABS: 0 10*3/uL (ref 0.0–0.1)
Basophils Relative: 0 %
EOS ABS: 0.1 10*3/uL (ref 0.0–0.7)
EOS PCT: 1 %
HCT: 38 % (ref 36.0–46.0)
Hemoglobin: 12.6 g/dL (ref 12.0–15.0)
Lymphocytes Relative: 13 %
Lymphs Abs: 1.7 10*3/uL (ref 0.7–4.0)
MCH: 29.9 pg (ref 26.0–34.0)
MCHC: 33.2 g/dL (ref 30.0–36.0)
MCV: 90 fL (ref 78.0–100.0)
Monocytes Absolute: 0.6 10*3/uL (ref 0.1–1.0)
Monocytes Relative: 4 %
Neutro Abs: 10.9 10*3/uL — ABNORMAL HIGH (ref 1.7–7.7)
Neutrophils Relative %: 82 %
PLATELETS: 313 10*3/uL (ref 150–400)
RBC: 4.22 MIL/uL (ref 3.87–5.11)
RDW: 13 % (ref 11.5–15.5)
WBC: 13.3 10*3/uL — AB (ref 4.0–10.5)

## 2016-05-19 LAB — URINALYSIS, ROUTINE W REFLEX MICROSCOPIC
BILIRUBIN URINE: NEGATIVE
GLUCOSE, UA: NEGATIVE mg/dL
Ketones, ur: NEGATIVE mg/dL
NITRITE: NEGATIVE
PH: 5.5 (ref 5.0–8.0)
Protein, ur: NEGATIVE mg/dL
SPECIFIC GRAVITY, URINE: 1.026 (ref 1.005–1.030)

## 2016-05-19 LAB — I-STAT CHEM 8, ED
BUN: 19 mg/dL (ref 6–20)
Calcium, Ion: 1.21 mmol/L (ref 1.15–1.40)
Chloride: 105 mmol/L (ref 101–111)
Creatinine, Ser: 1 mg/dL (ref 0.44–1.00)
GLUCOSE: 96 mg/dL (ref 65–99)
HCT: 38 % (ref 36.0–46.0)
HEMOGLOBIN: 12.9 g/dL (ref 12.0–15.0)
Potassium: 4.3 mmol/L (ref 3.5–5.1)
SODIUM: 141 mmol/L (ref 135–145)
TCO2: 28 mmol/L (ref 0–100)

## 2016-05-19 LAB — URINALYSIS, MICROSCOPIC (REFLEX)

## 2016-05-19 LAB — PREGNANCY, URINE: Preg Test, Ur: NEGATIVE

## 2016-05-19 LAB — LIPASE, BLOOD: LIPASE: 16 U/L (ref 11–51)

## 2016-05-19 MED ORDER — HYDROCODONE-ACETAMINOPHEN 5-325 MG PO TABS: 1.0000 | ORAL_TABLET | Freq: Four times a day (QID) | ORAL | 0 refills | Status: DC | PRN

## 2016-05-19 MED ORDER — MORPHINE SULFATE (PF) 4 MG/ML IV SOLN
4.0000 mg | Freq: Once | INTRAVENOUS | Status: AC
  Administered 2016-05-19: 4 mg via INTRAVENOUS
  Filled 2016-05-19: qty 1

## 2016-05-19 MED ORDER — OMEPRAZOLE 20 MG PO CPDR: 20.0000 mg | DELAYED_RELEASE_CAPSULE | Freq: Every day | ORAL | 2 refills | Status: DC

## 2016-05-19 MED ORDER — ONDANSETRON HCL 4 MG/2ML IJ SOLN
4.0000 mg | Freq: Once | INTRAMUSCULAR | Status: AC
  Administered 2016-05-19: 4 mg via INTRAVENOUS
  Filled 2016-05-19: qty 2

## 2016-05-19 MED ORDER — ONDANSETRON 4 MG PO TBDP: 4.0000 mg | ORAL_TABLET | Freq: Three times a day (TID) | ORAL | 0 refills | Status: DC | PRN

## 2016-05-19 MED ORDER — SODIUM CHLORIDE 0.9 % IV BOLUS (SEPSIS)
1000.0000 mL | Freq: Once | INTRAVENOUS | Status: AC
  Administered 2016-05-19: 1000 mL via INTRAVENOUS

## 2016-05-19 NOTE — ED Notes (Signed)
Pt given water for PO challenge 

## 2016-05-19 NOTE — ED Notes (Signed)
ED Provider at bedside. 

## 2016-05-19 NOTE — ED Provider Notes (Signed)
[facility]   CSN: [id number] Arrival date & time: 05/19/16  0856     History   Chief Complaint Chief Complaint  Patient presents with  . Abdominal Pain    HPI Brittney Khan is a 31 y.o. female.  HPI   Brittney Khan is a 31 y.o. female, with a history of Asthma and colitis, presenting to the ED with abdominal pain with nausea, vomiting, and diarrhea beginning upon waking this morning around 7 AM. Pain is upper abdominal, rates it 8/10, vague description, nonradiating. She describes her diarrhea has soft, non-watery stools. States this has happened before and she was diagnosed with possible colitis. She has not tried anything for her symptoms. No alleviating factors. Patient was placed on Mobic last year for a short amount of time, but denies long-term NSAID use.  No recent travel or contact with persons who have traveled recently. Denies hematochezia, melena, fever/chills, or any other complaints.       Past Medical History:  Diagnosis Date  . Asthma   . Hip fracture, right [facility]) 2010    Patient Active Problem List   Diagnosis Date Noted  . Left knee pain 08/06/2015  . Nausea vomiting and diarrhea 12/23/2012  . Infectious colitis 12/23/2012    History reviewed. No pertinent surgical history.  OB History    No data available       Home Medications    Prior to Admission medications   Medication Sig Start Date End Date Taking? Authorizing Provider  albuterol (PROVENTIL) (2.5 MG/3ML) 0.083% nebulizer solution Take 3 mLs (2.5 mg total) by nebulization every 6 (six) hours as needed for wheezing or shortness of breath. 11/30/13  Yes Mellody Drown, PA-C  albuterol (PROVENTIL HFA;VENTOLIN HFA) 108 (90 BASE) MCG/ACT inhaler Inhale 1-2 puffs into the lungs every 6 (six) hours as needed for wheezing or shortness of breath.    Historical Provider, MD  famotidine (PEPCID) 20 MG tablet Take 1 tablet (20 mg total) by mouth 2 (two) times  daily. Patient not taking: Reported on 04/17/2015 11/30/13   Mellody Drown, PA-C  HYDROcodone-acetaminophen (NORCO/VICODIN) 5-325 MG tablet Take 1 tablet by mouth every 6 (six) hours as needed. 05/19/16   Shawn C Joy, PA-C  omeprazole (PRILOSEC) 20 MG capsule Take 1 capsule (20 mg total) by mouth daily. 05/19/16   Shawn C Joy, PA-C  ondansetron (ZOFRAN ODT) 4 MG disintegrating tablet Take 1 tablet (4 mg total) by mouth every 8 (eight) hours as needed for nausea or vomiting. 05/19/16   Shawn C Joy, PA-C  ranitidine (ZANTAC) 75 MG tablet Take 1 tablet (75 mg total) by mouth 2 (two) times daily. Patient not taking: Reported on 04/17/2015 07/19/14   Blake Divine, MD  traMADol (ULTRAM) 50 MG tablet Take 1 tablet (50 mg total) by mouth every 6 (six) hours as needed. 08/29/15   Lenda Kelp, MD    Family History No family history on file.  Social History Social History  Substance Use Topics  . Smoking status: Former Smoker    Quit date: 04/05/2010  . Smokeless tobacco: Never Used  . Alcohol use 0.0 oz/week     Comment: socially     Allergies   Patient has no known allergies.   Review of Systems Review of Systems  Constitutional: Negative for chills and fever.  Gastrointestinal: Positive for abdominal pain, diarrhea, nausea and vomiting. Negative for blood in stool.  Genitourinary: Negative for dysuria and pelvic pain.  Musculoskeletal: Negative for back pain.  All other systems reviewed and are negative.    Physical Exam Updated Vital Signs BP 137/92 (BP Location: Right Arm)   Pulse (!) 55   Temp 98 F (36.7 C) (Oral)   Resp 18   Ht 5\' 6"  (1.676 m)   Wt 81.6 kg   LMP 05/17/2016   SpO2 100%   BMI 29.05 kg/m   Physical Exam  Constitutional: She appears well-developed and well-nourished. No distress.  HENT:  Head: Normocephalic and atraumatic.  Eyes: Conjunctivae are normal.  Neck: Neck supple.  Cardiovascular: Normal rate, regular rhythm, normal heart sounds and intact  distal pulses.   Pulmonary/Chest: Effort normal and breath sounds normal. No respiratory distress.  Abdominal: Soft. Normal appearance and bowel sounds are normal. There is tenderness in the epigastric area. There is no guarding.  Seemingly minor tenderness noted. Reaction to palpation was delayed.  Musculoskeletal: She exhibits no edema.  Lymphadenopathy:    She has no cervical adenopathy.  Neurological: She is alert.  Skin: Skin is warm and dry. She is not diaphoretic.  Psychiatric: She has a normal mood and affect. Her behavior is normal.  Nursing note and vitals reviewed.    ED Treatments / Results  Labs (all labs ordered are listed, but only abnormal results are displayed) Labs Reviewed  URINALYSIS, ROUTINE W REFLEX MICROSCOPIC - Abnormal; Notable for the following:       Result Value   Hgb urine dipstick MODERATE (*)    Leukocytes, UA SMALL (*)    All other components within normal limits  COMPREHENSIVE METABOLIC PANEL - Abnormal; Notable for the following:    Glucose, Bld 100 (*)    Total Protein 6.4 (*)    AST 14 (*)    ALT 10 (*)    All other components within normal limits  CBC WITH DIFFERENTIAL/PLATELET - Abnormal; Notable for the following:    WBC 13.3 (*)    Neutro Abs 10.9 (*)    All other components within normal limits  URINALYSIS, MICROSCOPIC (REFLEX) - Abnormal; Notable for the following:    Bacteria, UA FEW (*)    Squamous Epithelial / LPF 0-5 (*)    All other components within normal limits  PREGNANCY, URINE  LIPASE, BLOOD  I-STAT CHEM 8, ED    EKG  EKG Interpretation None       Radiology No results found.  Procedures Procedures (including critical care time)  Medications Ordered in ED Medications  ondansetron (ZOFRAN) injection 4 mg (4 mg Intravenous Given 05/19/16 0944)  morphine 4 MG/ML injection 4 mg (4 mg Intravenous Given 05/19/16 0944)  sodium chloride 0.9 % bolus 1,000 mL (0 mLs Intravenous Stopped 05/19/16 1051)  morphine 4 MG/ML  injection 4 mg (4 mg Intravenous Given 05/19/16 1050)  ondansetron (ZOFRAN) injection 4 mg (4 mg Intravenous Given 05/19/16 1050)     Initial Impression / Assessment and Plan / ED Course  I have reviewed the triage vital signs and the nursing notes.  Pertinent labs & imaging results that were available during my care of the patient were reviewed by me and considered in my medical decision making (see chart for details).  Clinical Course as of May 20 1247  Wed May 19, 2016  1027 Patient states she is currently menstruating. Hgb urine dipstick: (!) MODERATE [SJ]    Clinical Course User Index [SJ] Anselm Pancoast, PA-C      Patient presents with epigastric pain along with nausea, vomiting, and loose stools. Patient is nontoxic appearing, afebrile,  not tachycardic, not tachypneic, not hypotensive, maintains SPO2 of 100% on room air, and is in no apparent distress. Patient has no signs of sepsis or other serious or life-threatening condition. Patient's record for her prior admission for colitis was reviewed. When patient was asked to compare how she felt today versus then, she states that the incident years ago was far worse. Upon reexamination, patient voices improvement in her pain and nausea. No instances of vomiting or diarrhea while here in the ED. Patient is lounging on the bed in no apparent discomfort. Patient was reevaluated again. She states that she feels much better and her symptoms are "pretty much gone." Patient is able to pass an oral fluid challenge. Patient counseled on avoiding medications and habits that can exacerbate gastritis. PCP follow-up for reassessment and chronic prevention management. Further home care and return precautions discussed. Patient voices understanding of all instructions and is comfortable with discharge.   Findings and plan of care discussed with Loren Raceravid Yelverton, MD.   Vitals:   05/19/16 [id number] 05/19/16 1049 05/19/16 1215  BP: 137/92 125/73 130/80  Pulse:  (!) 55 (!) 55 (!) 57  Resp: 18 17 18   Temp: 98 F (36.7 C)    TempSrc: Oral    SpO2: 100%  100%  Weight: 81.6 kg    Height: 5\' 6"  (1.676 m)     Note: the lab was down at this location. CMP and Lipase had to be sent to main lab. Patient was made aware of this issue from initial contact.     Final Clinical Impressions(s) / ED Diagnoses   Final diagnoses:  Acute gastritis without hemorrhage, unspecified gastritis type    New Prescriptions Discharge Medication List as of 05/19/2016 12:04 PM    START taking these medications   Details  HYDROcodone-acetaminophen (NORCO/VICODIN) 5-325 MG tablet Take 1 tablet by mouth every 6 (six) hours as needed., Starting Wed 05/19/2016, Print    omeprazole (PRILOSEC) 20 MG capsule Take 1 capsule (20 mg total) by mouth daily., Starting Wed 05/19/2016, Print    ondansetron (ZOFRAN ODT) 4 MG disintegrating tablet Take 1 tablet (4 mg total) by mouth every 8 (eight) hours as needed for nausea or vomiting., Starting Wed 05/19/2016, Print         Anselm PancoastShawn C Joy, PA-C 05/19/16 1249    Loren Raceravid Yelverton, MD 05/20/16 [phone]

## 2016-05-19 NOTE — Discharge Instructions (Signed)
To avoid further issues with gastritis, please avoid things like NSAIDs (ex. Ibuprofen, naproxen, MOBIC, etc), alcohol, smoking, spicy foods, to name a few.  Begin taking the Prilosec daily. Take this medication 20-30 minutes prior to the first meal the day. Take this medication daily regardless of how you feel. Use Zofran as needed to alleviate vomiting and to allow proper oral hydration. You should be drinking at least a half a liter of water an hour. Your urine should be light yellow to almost clear. If it is darker than this, you should be drinking more water. Tylenol for pain. Vicodin for severe pain. Do not drive or perform other dangerous activities while taking the Vicodin. Follow up with a primary care provider on this issue for chronic management and to avoid recurrences. Should symptoms worsen and you need to return to the emergency department, [facility], as they have admission facilities, if that service is needed.

## 2016-05-19 NOTE — ED Triage Notes (Signed)
Upper abd pain with vomiting x 2 hours.

## 2016-12-19 ENCOUNTER — Emergency Department (HOSPITAL_COMMUNITY)
Admission: EM | Admit: 2016-12-19 | Discharge: 2016-12-19 | Disposition: A | Discharge status: Home / Self Care | Payer: Non-veteran care | Attending: Emergency Medicine | Admitting: Emergency Medicine

## 2016-12-19 ENCOUNTER — Emergency Department (HOSPITAL_COMMUNITY): Payer: Non-veteran care

## 2016-12-19 ENCOUNTER — Encounter (HOSPITAL_COMMUNITY): Payer: Self-pay | Admitting: Emergency Medicine

## 2016-12-19 DIAGNOSIS — R112 Nausea with vomiting, unspecified: Secondary | ICD-10-CM | POA: Diagnosis not present

## 2016-12-19 DIAGNOSIS — J45909 Unspecified asthma, uncomplicated: Secondary | ICD-10-CM | POA: Diagnosis not present

## 2016-12-19 DIAGNOSIS — R1013 Epigastric pain: Secondary | ICD-10-CM

## 2016-12-19 DIAGNOSIS — Z87891 Personal history of nicotine dependence: Secondary | ICD-10-CM | POA: Insufficient documentation

## 2016-12-19 LAB — CBC
HCT: 42.1 % (ref 36.0–46.0)
HEMOGLOBIN: 14.2 g/dL (ref 12.0–15.0)
MCH: 30.9 pg (ref 26.0–34.0)
MCHC: 33.7 g/dL (ref 30.0–36.0)
MCV: 91.5 fL (ref 78.0–100.0)
Platelets: 349 10*3/uL (ref 150–400)
RBC: 4.6 MIL/uL (ref 3.87–5.11)
RDW: 13 % (ref 11.5–15.5)
WBC: 18.4 10*3/uL — ABNORMAL HIGH (ref 4.0–10.5)

## 2016-12-19 LAB — I-STAT CG4 LACTIC ACID, ED
LACTIC ACID, VENOUS: 3.38 mmol/L — AB (ref 0.5–1.9)
LACTIC ACID, VENOUS: 2.18 mmol/L — AB (ref 0.5–1.9)

## 2016-12-19 LAB — URINALYSIS, ROUTINE W REFLEX MICROSCOPIC
BACTERIA UA: NONE SEEN
BILIRUBIN URINE: NEGATIVE
GLUCOSE, UA: NEGATIVE mg/dL
KETONES UR: NEGATIVE mg/dL
Leukocytes, UA: NEGATIVE
NITRITE: NEGATIVE
PROTEIN: NEGATIVE mg/dL
Specific Gravity, Urine: 1.024 (ref 1.005–1.030)
pH: 6 (ref 5.0–8.0)

## 2016-12-19 LAB — COMPREHENSIVE METABOLIC PANEL
ALBUMIN: 4.1 g/dL (ref 3.5–5.0)
ALT: 9 U/L — ABNORMAL LOW (ref 14–54)
ANION GAP: 7 (ref 5–15)
AST: 18 U/L (ref 15–41)
Alkaline Phosphatase: 97 U/L (ref 38–126)
BUN: 9 mg/dL (ref 6–20)
CO2: 24 mmol/L (ref 22–32)
Calcium: 9.4 mg/dL (ref 8.9–10.3)
Chloride: 108 mmol/L (ref 101–111)
Creatinine, Ser: 1.01 mg/dL — ABNORMAL HIGH (ref 0.44–1.00)
GFR calc non Af Amer: >60 mL/min (ref >60)
GLUCOSE: 114 mg/dL — AB (ref 65–99)
POTASSIUM: 3.7 mmol/L (ref 3.5–5.1)
SODIUM: 139 mmol/L (ref 135–145)
TOTAL PROTEIN: 7.5 g/dL (ref 6.5–8.1)
Total Bilirubin: 0.4 mg/dL (ref 0.3–1.2)

## 2016-12-19 LAB — I-STAT BETA HCG BLOOD, ED (MC, WL, AP ONLY)

## 2016-12-19 LAB — TROPONIN I: Troponin I: <0.03 ng/mL (ref <0.03)

## 2016-12-19 LAB — LIPASE, BLOOD: Lipase: 23 U/L (ref 11–51)

## 2016-12-19 MED ORDER — PROMETHAZINE HCL 25 MG/ML IJ SOLN
25.0000 mg | Freq: Four times a day (QID) | INTRAMUSCULAR | Status: DC | PRN
  Administered 2016-12-19: 25 mg via INTRAVENOUS
  Filled 2016-12-19: qty 1

## 2016-12-19 MED ORDER — ONDANSETRON 4 MG PO TBDP
ORAL_TABLET | ORAL | Status: AC
  Filled 2016-12-19: qty 1

## 2016-12-19 MED ORDER — SODIUM CHLORIDE 0.9 % IV BOLUS (SEPSIS)
1000.0000 mL | Freq: Once | INTRAVENOUS | Status: AC
  Administered 2016-12-19: 1000 mL via INTRAVENOUS

## 2016-12-19 MED ORDER — OMEPRAZOLE 20 MG PO CPDR: 20.0000 mg | DELAYED_RELEASE_CAPSULE | Freq: Every day | ORAL | 2 refills | Status: DC

## 2016-12-19 MED ORDER — DIPHENHYDRAMINE HCL 50 MG/ML IJ SOLN
25.0000 mg | Freq: Once | INTRAMUSCULAR | Status: AC
  Administered 2016-12-19: 25 mg via INTRAVENOUS

## 2016-12-19 MED ORDER — ONDANSETRON 4 MG PO TBDP: 4.0000 mg | ORAL_TABLET | Freq: Three times a day (TID) | ORAL | 0 refills | Status: DC | PRN

## 2016-12-19 MED ORDER — ALUM & MAG HYDROXIDE-SIMETH 400-400-40 MG/5ML PO SUSP: 10.0000 mL | Freq: Four times a day (QID) | ORAL | 0 refills | Status: DC | PRN

## 2016-12-19 MED ORDER — RANITIDINE HCL 75 MG PO TABS: 75.0000 mg | ORAL_TABLET | Freq: Two times a day (BID) | ORAL | 0 refills | Status: DC

## 2016-12-19 MED ORDER — MORPHINE SULFATE (PF) 4 MG/ML IV SOLN
4.0000 mg | Freq: Once | INTRAVENOUS | Status: AC
  Administered 2016-12-19: 4 mg via INTRAVENOUS
  Filled 2016-12-19: qty 1

## 2016-12-19 MED ORDER — IOPAMIDOL (ISOVUE-300) INJECTION 61%
INTRAVENOUS | Status: AC
  Administered 2016-12-19: 100 mL
  Filled 2016-12-19: qty 100

## 2016-12-19 MED ORDER — FENTANYL CITRATE (PF) 100 MCG/2ML IJ SOLN
50.0000 ug | Freq: Once | INTRAMUSCULAR | Status: AC
  Administered 2016-12-19: 50 ug via INTRAVENOUS
  Filled 2016-12-19: qty 2

## 2016-12-19 MED ORDER — GI COCKTAIL ~~LOC~~
30.0000 mL | Freq: Once | ORAL | Status: AC
  Administered 2016-12-19: 30 mL via ORAL
  Filled 2016-12-19: qty 30

## 2016-12-19 MED ORDER — ONDANSETRON 4 MG PO TBDP
4.0000 mg | ORAL_TABLET | Freq: Once | ORAL | Status: AC
  Administered 2016-12-19: 4 mg via ORAL

## 2016-12-19 MED ORDER — METOCLOPRAMIDE HCL 5 MG/ML IJ SOLN
10.0000 mg | Freq: Once | INTRAMUSCULAR | Status: AC
  Administered 2016-12-19: 10 mg via INTRAVENOUS
  Filled 2016-12-19: qty 2

## 2016-12-19 NOTE — ED Triage Notes (Signed)
Pt. Stated, I woke up with my stomach hurting and N/V , Im also having hot and cold chills and sweats.

## 2016-12-19 NOTE — ED Notes (Signed)
Lactic Acid 3.38 Zofran no help

## 2016-12-19 NOTE — ED Notes (Signed)
Patient transported to CT 

## 2016-12-19 NOTE — Discharge Instructions (Addendum)
You presented to the ED for abdominal pain, nausea and vomiting. Your CT scan was normal. Based in your history and exam I suspect you may have inflammation of your stomach, called gastritis. Please restart taking your omeprazole, Zantac as previously prescribed. You can try Maalox before meals to help with your discomfort. Avoid ibuprofen, or other similar products. Avoid alcohol, greasy or spicy foods as this will cause more inflammation your stomach. Over the next 24 hours, start with a clear liquid diet. Slowly advance your diet by starting with mild, soft foods. Contact your primary care provider within one week for persistent symptoms, you should follow-up with a gastroenterologist.  Return for worsening abdominal pain, inability to keep fluids down due to vomiting, fevers, chills

## 2016-12-19 NOTE — ED Provider Notes (Signed)
[facility]-EMERGENCY DEPT Provider Note   CSN: [id number] Arrival date & time: 12/19/16  1408     History   Chief Complaint Chief Complaint  Patient presents with  . Abdominal Pain  . Emesis  . Nausea   HPI Brittney Khan is a 31 y.o. female with previous h/o gastritis and infectious colitis presents to ED for evaluation of sudden, gradually worsening epigastric abdominal pain, constant and non radiating associated with nausea, NBNB vomiting, chills, sweats that began this morning. Has not been able to drink or eat anything today. Denies fevers, CP, SOB, back pain, flank pain, constipation, diarrhea, melena, BRBRP, dysuria, vaginal bleeding or discharge. She is sexually active with female partners only. Occasional ETOH use. No illicit drug use. No marijuana use. No intake of suspicious foods. No sick contacts with n/v/d. No recent travel. States her symptoms today feel similar to previous gastritis. PCP from [place]. No GI provider. Zofran in waiting room has not helped. No alleviating or aggravating factors.  HPI  Past Medical History:  Diagnosis Date  . Asthma   . Hip fracture, right [facility]) 2010    Patient Active Problem List   Diagnosis Date Noted  . Left knee pain 08/06/2015  . Nausea vomiting and diarrhea 12/23/2012  . Infectious colitis 12/23/2012    History reviewed. No pertinent surgical history.  OB History    No data available       Home Medications    Prior to Admission medications   Medication Sig Start Date End Date Taking? Authorizing Provider  albuterol (PROVENTIL HFA;VENTOLIN HFA) 108 (90 BASE) MCG/ACT inhaler Inhale 1-2 puffs into the lungs every 6 (six) hours as needed for wheezing or shortness of breath.   Yes [provider]  albuterol (PROVENTIL) (2.5 MG/3ML) 0.083% nebulizer solution Take 3 mLs (2.5 mg total) by nebulization every 6 (six) hours as needed for wheezing or shortness of breath. 11/30/13  Yes Mellody Drown, PA-C  alum & mag  hydroxide-simeth (MAALOX ADVANCED MAX ST) 400-400-40 MG/5ML suspension Take 10 mLs by mouth every 6 (six) hours as needed for indigestion. 12/19/16   Liberty Handy, PA-C  famotidine (PEPCID) 20 MG tablet Take 1 tablet (20 mg total) by mouth 2 (two) times daily. Patient not taking: Reported on 04/17/2015 11/30/13   Mellody Drown, PA-C  HYDROcodone-acetaminophen (NORCO/VICODIN) 5-325 MG tablet Take 1 tablet by mouth every 6 (six) hours as needed. Patient not taking: Reported on 12/19/2016 05/19/16   Anselm Pancoast, PA-C  omeprazole (PRILOSEC) 20 MG capsule Take 1 capsule (20 mg total) by mouth daily. 12/19/16   Liberty Handy, PA-C  ondansetron (ZOFRAN ODT) 4 MG disintegrating tablet Take 1 tablet (4 mg total) by mouth every 8 (eight) hours as needed for nausea or vomiting. 12/19/16   Liberty Handy, PA-C  ranitidine (ZANTAC) 75 MG tablet Take 1 tablet (75 mg total) by mouth 2 (two) times daily. 12/19/16   Liberty Handy, PA-C  traMADol (ULTRAM) 50 MG tablet Take 1 tablet (50 mg total) by mouth every 6 (six) hours as needed. Patient not taking: Reported on 12/19/2016 08/29/15   Lenda Kelp, MD    Family History No family history on file.  Social History Social History  Substance Use Topics  . Smoking status: Former Smoker    Quit date: 04/05/2010  . Smokeless tobacco: Never Used  . Alcohol use 0.0 oz/week     Comment: socially     Allergies   Patient has no known allergies.  Review of Systems Review of Systems  Constitutional: Positive for appetite change, chills and diaphoresis. Negative for fever.  HENT: Negative for congestion and sore throat.   Respiratory: Negative for shortness of breath.   Cardiovascular: Negative for chest pain.  Gastrointestinal: Positive for abdominal pain, nausea and vomiting. Negative for blood in stool, constipation and diarrhea.  Genitourinary: Negative for dysuria, flank pain, vaginal bleeding and vaginal discharge.  Musculoskeletal:  Negative for back pain.  Allergic/Immunologic: Negative for immunocompromised state.     Physical Exam Updated Vital Signs BP (!) 140/94   Pulse (!) 110   Temp 97.6 F (36.4 C) (Oral)   Resp (!) 23   LMP 12/18/2016   SpO2 100%   Physical Exam  Constitutional: She is oriented to person, place, and time. She appears well-developed and well-nourished. No distress.  Non toxic but appears uncomfortable  HENT:  Head: Normocephalic and atraumatic.  Nose: Nose normal.  Mouth/Throat: No oropharyngeal exudate.  Moist mucous membranes  Eyes: Pupils are equal, round, and reactive to light. Conjunctivae and EOM are normal.  Neck: Normal range of motion. Neck supple.  Cardiovascular: Normal rate, regular rhythm, normal heart sounds and intact distal pulses.   No murmur heard. Pulmonary/Chest: Effort normal and breath sounds normal. She exhibits no tenderness.  Abdominal: Soft. Bowel sounds are normal. She exhibits no distension. There is tenderness.  Epigastric tenderness Negative Murphy's and McBurney's No suprapubic or CVA tenderness No rebound or rigidity   Musculoskeletal: Normal range of motion. She exhibits no deformity.  Lymphadenopathy:    She has no cervical adenopathy.  Neurological: She is alert and oriented to person, place, and time. No sensory deficit.  Skin: Capillary refill takes less than 2 seconds.  Cold and clammy  Psychiatric: She has a normal mood and affect. Her behavior is normal. Judgment and thought content normal.  Nursing note and vitals reviewed.    ED Treatments / Results  Labs (all labs ordered are listed, but only abnormal results are displayed) Labs Reviewed  COMPREHENSIVE METABOLIC PANEL - Abnormal; Notable for the following:       Result Value   Glucose, Bld 114 (*)    Creatinine, Ser 1.01 (*)    ALT 9 (*)    All other components within normal limits  CBC - Abnormal; Notable for the following:    WBC 18.4 (*)    All other components within  normal limits  URINALYSIS, ROUTINE W REFLEX MICROSCOPIC - Abnormal; Notable for the following:    Color, Urine COLORLESS (*)    Hgb urine dipstick SMALL (*)    Squamous Epithelial / LPF 0-5 (*)    All other components within normal limits  I-STAT CG4 LACTIC ACID, ED - Abnormal; Notable for the following:    Lactic Acid, Venous 3.38 (*)    All other components within normal limits  I-STAT CG4 LACTIC ACID, ED - Abnormal; Notable for the following:    Lactic Acid, Venous 2.18 (*)    All other components within normal limits  LIPASE, BLOOD  TROPONIN I  I-STAT BETA HCG BLOOD, ED ([facility], [facility], [facility] ONLY)    EKG  EKG Interpretation  Date/Time:  Sunday December 19 2016 16:05:11 EDT Ventricular Rate:  91 PR Interval:    QRS Duration: 92 QT Interval:  402 QTC Calculation: 495 R Axis:   38 Text Interpretation:  Sinus rhythm Borderline short PR interval RSR' in V1 or V2, right VCD or RVH Borderline prolonged QT interval No significant change since  last tracing Confirmed by Doug Sou [phone]) on 12/19/2016 4:09:30 PM       Radiology Ct Abdomen Pelvis W Contrast  Result Date: 12/19/2016 CLINICAL DATA:  31 year old female with acute abdominal pain and vomiting today. EXAM: CT ABDOMEN AND PELVIS WITH CONTRAST TECHNIQUE: Multidetector CT imaging of the abdomen and pelvis was performed using the standard protocol following bolus administration of intravenous contrast. CONTRAST:  ISOVUE-300 IOPAMIDOL (ISOVUE-300) INJECTION 61% COMPARISON:  12/23/2012 CT FINDINGS: Lower chest: No acute abnormality Hepatobiliary: The liver and gallbladder are unremarkable. No biliary dilatation. Pancreas: Unremarkable Spleen: Unremarkable Adrenals/Urinary Tract: The kidneys, adrenal glands and bladder are unremarkable. Stomach/Bowel: Stomach is within normal limits. Appendix appears normal. No evidence of bowel wall thickening, distention, or inflammatory changes. 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 No enlarged abdominal or pelvic lymph nodes. Reproductive: Uterus and bilateral adnexa are unremarkable. Other: No ascites, pneumoperitoneum or abscess Musculoskeletal: No acute or significant osseous findings. IMPRESSION: 1. No acute or significant abnormalities. Electronically Signed   By: Harmon Pier M.D.   On: 12/19/2016 17:30    Procedures Procedures (including critical care time)  Medications Ordered in ED Medications  ondansetron (ZOFRAN-ODT) 4 MG disintegrating tablet (not administered)  promethazine (PHENERGAN) injection 25 mg (25 mg Intravenous Given 12/19/16 1738)  ondansetron (ZOFRAN-ODT) disintegrating tablet 4 mg (4 mg Oral Given 12/19/16 1427)  sodium chloride 0.9 % bolus 1,000 mL (0 mLs Intravenous Stopped 12/19/16 1730)  sodium chloride 0.9 % bolus 1,000 mL (0 mLs Intravenous Stopped 12/19/16 1837)  morphine 4 MG/ML injection 4 mg (4 mg Intravenous Given 12/19/16 1540)  metoCLOPramide (REGLAN) injection 10 mg (10 mg Intravenous Given 12/19/16 1540)  diphenhydrAMINE (BENADRYL) injection 25 mg (25 mg Intravenous Given 12/19/16 1554)  iopamidol (ISOVUE-300) 61 % injection (100 mLs  Contrast Given 12/19/16 1657)  gi cocktail (Maalox,Lidocaine,Donnatal) (30 mLs Oral Given 12/19/16 1838)  fentaNYL (SUBLIMAZE) injection 50 mcg (50 mcg Intravenous Given 12/19/16 1839)     Initial Impression / Assessment and Plan / ED Course  I have reviewed the triage vital signs and the nursing notes.  Pertinent labs & imaging results that were available during my care of the patient were reviewed by me and considered in my medical decision making (see chart for details).  Clinical Course as of Dec 20 1938  Wynelle Link Dec 19, 2016  1804 Reassessed pt. Informed her of blood work and CT scan. She reports resolution of nausea/vomiting, epigastric abdominal pain persists but slightly better. Abdomen soft mildly tender in epigastrium. Will tx pain, finish IVF and try  Po challenge.   [CG]  1805 WBC: (!) 18.4 [CG]  1805 Lactic Acid, Venous: (!!) 2.18 [CG]  1928 Re-evaluated pt. No vomiting, still mild nausea and pain. Has tolerated ginger ale without emesis. Discussed plan to d/c. She is agreeable.  [CG]    Clinical Course User Index [CG] Liberty Handy, PA-C   31 year old female with history of gastritis and infectious colitis presents to the ED for evaluation of sudden, gradually worsening epigastric abdominal pain associated with nausea and nonbilious nonbloody vomiting, chills, sweats onset earlier today. No fevers. No diarrhea, melena, hematochezia. No urinary symptoms. Has not eaten or drank anything all day today. No known sick contacts. Denies abdominal surgeries. Denies marijuana use. And exam, she has epigastric abdominal discomfort, without signs of peritonitis. She appears uncomfortable but nontoxic. Her skin is cold and clammy.  Triage ordered lactic acid which returned elevated 3.38. CBC shows elevated WBCs at  18.4, no anemia. CMP reassuring. Lipase normal. Pending EKG, troponin, urinalysis, urine pregnancy. We'll give 2 L IV fluids, antiemetics, morphine and reassess. Patient was shared with supervising physician who recommended CT scan of abdomen and pelvis.  1938: CT a/p negative. Nausea, vomiting and pain well controlled in ED. She tolerated PO challenge. Re-evaluated abdomen, improved pain. Discussed results and CT scn with pt. Suspect gastritis, as she has previous h/o of this and states it feels similar. Has not been taking prescribed omeprazole, zantac. Will d/c with PPI, zantac and maalox  Final Clinical Impressions(s) / ED Diagnoses   Final diagnoses:  Nausea and vomiting in adult  Epigastric abdominal pain    New Prescriptions New Prescriptions   ALUM & MAG HYDROXIDE-SIMETH (MAALOX ADVANCED MAX ST) 400-400-40 MG/5ML SUSPENSION    Take 10 mLs by mouth every 6 (six) hours as needed for indigestion.     Liberty Handy,  PA-C 12/19/16 1940    Doug Sou, MD 12/20/16 [phone]

## 2016-12-19 NOTE — ED Provider Notes (Signed)
Complains of multiple abscesses of vomiting and epigastric pain onset 11 AM today. No fever. No other associated symptoms. Patient currently on menses. On exam alert appears mildly uncomfortable lungs clear auscultation heart regular rate and rhythm abdomen tender epigastrium no guarding or rigidity   Doug Sou, MD 12/19/16 1912

## 2016-12-20 ENCOUNTER — Encounter (HOSPITAL_BASED_OUTPATIENT_CLINIC_OR_DEPARTMENT_OTHER): Payer: Self-pay | Admitting: Emergency Medicine

## 2016-12-20 ENCOUNTER — Emergency Department (HOSPITAL_BASED_OUTPATIENT_CLINIC_OR_DEPARTMENT_OTHER)
Admission: EM | Admit: 2016-12-20 | Discharge: 2016-12-20 | Disposition: A | Discharge status: Home / Self Care | Payer: Non-veteran care | Attending: Emergency Medicine | Admitting: Emergency Medicine

## 2016-12-20 DIAGNOSIS — J45909 Unspecified asthma, uncomplicated: Secondary | ICD-10-CM | POA: Diagnosis not present

## 2016-12-20 DIAGNOSIS — R1013 Epigastric pain: Secondary | ICD-10-CM

## 2016-12-20 DIAGNOSIS — R112 Nausea with vomiting, unspecified: Secondary | ICD-10-CM

## 2016-12-20 DIAGNOSIS — Z87891 Personal history of nicotine dependence: Secondary | ICD-10-CM | POA: Insufficient documentation

## 2016-12-20 LAB — CBC WITH DIFFERENTIAL/PLATELET
BASOS ABS: 0 10*3/uL (ref 0.0–0.1)
BASOS PCT: 0 %
Eosinophils Absolute: 0 10*3/uL (ref 0.0–0.7)
Eosinophils Relative: 0 %
HEMATOCRIT: 37.4 % (ref 36.0–46.0)
HEMOGLOBIN: 13 g/dL (ref 12.0–15.0)
Lymphocytes Relative: 5 %
Lymphs Abs: 1.1 10*3/uL (ref 0.7–4.0)
MCH: 31 pg (ref 26.0–34.0)
MCHC: 34.8 g/dL (ref 30.0–36.0)
MCV: 89.3 fL (ref 78.0–100.0)
Monocytes Absolute: 0.6 10*3/uL (ref 0.1–1.0)
Monocytes Relative: 3 %
NEUTROS ABS: 18.3 10*3/uL — AB (ref 1.7–7.7)
NEUTROS PCT: 92 %
Platelets: 304 10*3/uL (ref 150–400)
RBC: 4.19 MIL/uL (ref 3.87–5.11)
RDW: 12.4 % (ref 11.5–15.5)
WBC: 19.9 10*3/uL — AB (ref 4.0–10.5)

## 2016-12-20 LAB — BASIC METABOLIC PANEL
ANION GAP: 10 (ref 5–15)
BUN: 7 mg/dL (ref 6–20)
CALCIUM: 9 mg/dL (ref 8.9–10.3)
CO2: 22 mmol/L (ref 22–32)
Chloride: 104 mmol/L (ref 101–111)
Creatinine, Ser: 0.76 mg/dL (ref 0.44–1.00)
Glucose, Bld: 132 mg/dL — ABNORMAL HIGH (ref 65–99)
Potassium: 3.2 mmol/L — ABNORMAL LOW (ref 3.5–5.1)
SODIUM: 136 mmol/L (ref 135–145)

## 2016-12-20 MED ORDER — SUCRALFATE 1 GM/10ML PO SUSP
1.0000 g | Freq: Three times a day (TID) | ORAL | Status: DC
  Administered 2016-12-20: 1 g via ORAL
  Filled 2016-12-20: qty 10

## 2016-12-20 MED ORDER — FENTANYL CITRATE (PF) 100 MCG/2ML IJ SOLN: 50.0000 ug | Freq: Once | INTRAMUSCULAR | Status: DC

## 2016-12-20 MED ORDER — OMEPRAZOLE 20 MG PO CPDR: DELAYED_RELEASE_CAPSULE | ORAL | 0 refills | Status: DC

## 2016-12-20 MED ORDER — PROMETHAZINE HCL 25 MG/ML IJ SOLN
25.0000 mg | Freq: Once | INTRAMUSCULAR | Status: AC
  Administered 2016-12-20: 25 mg via INTRAVENOUS
  Filled 2016-12-20: qty 1

## 2016-12-20 MED ORDER — FENTANYL CITRATE (PF) 100 MCG/2ML IJ SOLN
100.0000 ug | Freq: Once | INTRAMUSCULAR | Status: AC
  Administered 2016-12-20: 100 ug via INTRAVENOUS
  Filled 2016-12-20: qty 2

## 2016-12-20 MED ORDER — SUCRALFATE 1 G PO TABS: 1.0000 g | ORAL_TABLET | Freq: Three times a day (TID) | ORAL | 0 refills | Status: DC

## 2016-12-20 MED ORDER — LACTATED RINGERS IV BOLUS (SEPSIS)
2000.0000 mL | Freq: Once | INTRAVENOUS | Status: AC
  Administered 2016-12-20: 2000 mL via INTRAVENOUS

## 2016-12-20 MED ORDER — PANTOPRAZOLE SODIUM 40 MG IV SOLR
40.0000 mg | Freq: Once | INTRAVENOUS | Status: AC
  Administered 2016-12-20: 40 mg via INTRAVENOUS
  Filled 2016-12-20: qty 40

## 2016-12-20 MED ORDER — PROMETHAZINE HCL 25 MG PO TABS: 25.0000 mg | ORAL_TABLET | Freq: Four times a day (QID) | ORAL | 0 refills | Status: DC | PRN

## 2016-12-20 NOTE — ED Provider Notes (Signed)
[facility] Provider Note: Lowella Dell, MD, FACEP  CSN: [id number] MRN: [id number] ARRIVAL: 12/20/16 at 0244 ROOM: [facility]10/[facility]10   CHIEF COMPLAINT  Abdominal Pain   HISTORY OF PRESENT ILLNESS  12/20/16 3:04 AM Brittney Khan is a 31 y.o. female who was seen yesterday for nausea, vomiting and epigastric pain that began yesterday morning. She had a prolonged ED course which involved administration of multiple medications and workup that included an unremarkable CT scan of the abdomen and pelvis. She returns with persistent nausea, vomiting and epigastric pain. She describes the pain as "a pain" and describes it as severe. It is somewhat worse with movement or palpation. She denies fever or diarrhea. She describes the vomiting as severe and it has become bilious. She denies marijuana use.   Past Medical History:  Diagnosis Date  . Asthma   . Hip fracture, right (HCC) 2010    History reviewed. No pertinent surgical history.  No family history on file.  Social History  Substance Use Topics  . Smoking status: Former Smoker    Quit date: 04/05/2010  . Smokeless tobacco: Never Used  . Alcohol use 0.0 oz/week     Comment: socially    Prior to Admission medications   Medication Sig Start Date End Date Taking? Authorizing Provider  albuterol (PROVENTIL HFA;VENTOLIN HFA) 108 (90 BASE) MCG/ACT inhaler Inhale 1-2 puffs into the lungs every 6 (six) hours as needed for wheezing or shortness of breath.    [provider]  albuterol (PROVENTIL) (2.5 MG/3ML) 0.083% nebulizer solution Take 3 mLs (2.5 mg total) by nebulization every 6 (six) hours as needed for wheezing or shortness of breath. 11/30/13   Mellody Drown, PA-C  alum & mag hydroxide-simeth (MAALOX ADVANCED MAX ST) 400-400-40 MG/5ML suspension Take 10 mLs by mouth every 6 (six) hours as needed for indigestion. 12/19/16   Liberty Handy, PA-C  famotidine (PEPCID) 20 MG tablet Take 1 tablet (20 mg total) by mouth  2 (two) times daily. Patient not taking: Reported on 04/17/2015 11/30/13   Mellody Drown, PA-C  HYDROcodone-acetaminophen (NORCO/VICODIN) 5-325 MG tablet Take 1 tablet by mouth every 6 (six) hours as needed. Patient not taking: Reported on 12/19/2016 05/19/16   Anselm Pancoast, PA-C  omeprazole (PRILOSEC) 20 MG capsule Take 1 capsule (20 mg total) by mouth daily. 12/19/16   Liberty Handy, PA-C  ondansetron (ZOFRAN ODT) 4 MG disintegrating tablet Take 1 tablet (4 mg total) by mouth every 8 (eight) hours as needed for nausea or vomiting. 12/19/16   Liberty Handy, PA-C  ranitidine (ZANTAC) 75 MG tablet Take 1 tablet (75 mg total) by mouth 2 (two) times daily. 12/19/16   Liberty Handy, PA-C  traMADol (ULTRAM) 50 MG tablet Take 1 tablet (50 mg total) by mouth every 6 (six) hours as needed. Patient not taking: Reported on 12/19/2016 08/29/15   Lenda Kelp, MD    Allergies Reglan [metoclopramide]   REVIEW OF SYSTEMS  Negative except as noted here or in the History of Present Illness.   PHYSICAL EXAMINATION  Initial Vital Signs Blood pressure (!) 142/84, pulse 70, temperature 97.7 F (36.5 C), temperature source Oral, resp. rate 20, height  (1.676 m), weight 77.1 kg (170 lb), last menstrual period 12/18/2016, SpO2 100 %.  Examination General: Well-developed, well-nourished female in no acute distress; appearance consistent with age of record HENT: normocephalic; atraumatic; mucous membranes moist Eyes: pupils equal, round and reactive to light; extraocular muscles intact Neck: supple Heart: regular  rate and rhythm Lungs: clear to auscultation bilaterally Abdomen: soft; nondistended; epigastric and left upper quadrant tenderness; no masses or hepatosplenomegaly; bowel sounds present Extremities: No deformity; full range of motion; pulses normal Neurologic: Awake, alert and oriented; motor function intact in all extremities and symmetric; no facial droop Skin: Warm and  dry Psychiatric: Flat affect   RESULTS  Summary of this visit's results, reviewed by myself:   EKG Interpretation  Date/Time:    Ventricular Rate:    PR Interval:    QRS Duration:   QT Interval:    QTC Calculation:   R Axis:     Text Interpretation:        Laboratory Studies: Results for orders placed or performed during the hospital encounter of 12/20/16 (from the past 24 hour(s))  Basic metabolic panel     Status: Abnormal   Collection Time: 12/20/16  3:45 AM  Result Value Ref Range   Sodium 136 135 - 145 mmol/L   Potassium 3.2 (L) 3.5 - 5.1 mmol/L   Chloride 104 101 - 111 mmol/L   CO2 22 22 - 32 mmol/L   Glucose, Bld 132 (H) 65 - 99 mg/dL   BUN 7 6 - 20 mg/dL   Creatinine, Ser 1.61 0.44 - 1.00 mg/dL   Calcium 9.0 8.9 - 09.6 mg/dL   GFR calc non Af Amer >60 >60 mL/min   GFR calc Af Amer >60 >60 mL/min   Anion gap 10 5 - 15  CBC with Differential/Platelet     Status: Abnormal   Collection Time: 12/20/16  3:45 AM  Result Value Ref Range   WBC 19.9 (H) 4.0 - 10.5 K/uL   RBC 4.19 3.87 - 5.11 MIL/uL   Hemoglobin 13.0 12.0 - 15.0 g/dL   HCT 04.5 40.9 - 81.1 %   MCV 89.3 78.0 - 100.0 fL   MCH 31.0 26.0 - 34.0 pg   MCHC 34.8 30.0 - 36.0 g/dL   RDW 91.4 78.2 - 95.6 %   Platelets 304 150 - 400 K/uL   Neutrophils Relative % 92 %   Neutro Abs 18.3 (H) 1.7 - 7.7 K/uL   Lymphocytes Relative 5 %   Lymphs Abs 1.1 0.7 - 4.0 K/uL   Monocytes Relative 3 %   Monocytes Absolute 0.6 0.1 - 1.0 K/uL   Eosinophils Relative 0 %   Eosinophils Absolute 0.0 0.0 - 0.7 K/uL   Basophils Relative 0 %   Basophils Absolute 0.0 0.0 - 0.1 K/uL   Imaging Studies: Ct Abdomen Pelvis W Contrast  Result Date: 12/19/2016 CLINICAL DATA:  31 year old female with acute abdominal pain and vomiting today. EXAM: CT ABDOMEN AND PELVIS WITH CONTRAST TECHNIQUE: Multidetector CT imaging of the abdomen and pelvis was performed using the standard protocol following bolus administration of intravenous  contrast. CONTRAST:  ISOVUE-300 IOPAMIDOL (ISOVUE-300) INJECTION 61% COMPARISON:  12/23/2012 CT FINDINGS: Lower chest: No acute abnormality Hepatobiliary: The liver and gallbladder are unremarkable. No biliary dilatation. Pancreas: Unremarkable Spleen: Unremarkable Adrenals/Urinary Tract: The kidneys, adrenal glands and bladder are unremarkable. Stomach/Bowel: Stomach is within normal limits. Appendix appears normal. No evidence of bowel wall thickening, distention, or inflammatory changes. 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 No enlarged abdominal or pelvic lymph nodes. Reproductive: Uterus and bilateral adnexa are unremarkable. Other: No ascites, pneumoperitoneum or abscess Musculoskeletal: No acute or significant osseous findings. IMPRESSION: 1. No acute or significant abnormalities. Electronically Signed   By: Harmon Pier M.D.   On: 12/19/2016 17:30  ED COURSE  Nursing notes and initial vitals signs, including pulse oximetry, reviewed.  Vitals:   12/20/16 0255 12/20/16 0300 12/20/16 0436 12/20/16 0437  BP:  (!) 144/90 (!) 156/100   Pulse:    94  Resp:      Temp: 97.7 F (36.5 C)     TempSrc: Oral     SpO2:    100%  Weight:      Height:       4:43 AM Pain and nausea significantly improved after IV and oral medications. White count is notably elevated but CT scan yesterday was reassuring and there were no other significant lab abnormalities. Will discharge home on oral medications and advised to return if symptoms change or worsen.  PROCEDURES    ED DIAGNOSES     ICD-10-CM   1. Epigastric abdominal pain R10.13   2. Nausea and vomiting in adult R11.2        Paula Libra, MD 12/20/16 [phone]

## 2016-12-20 NOTE — ED Triage Notes (Signed)
C/o epigastric pain, n/v since 1300 yesterday. Pt was seen in ED yesterday, but states she is now "worse".

## 2017-03-11 ENCOUNTER — Emergency Department (HOSPITAL_COMMUNITY)
Admission: EM | Admit: 2017-03-11 | Discharge: 2017-03-11 | Disposition: A | Discharge status: Home / Self Care | Payer: Non-veteran care | Attending: Emergency Medicine | Admitting: Emergency Medicine

## 2017-03-11 ENCOUNTER — Encounter (HOSPITAL_COMMUNITY): Payer: Self-pay

## 2017-03-11 ENCOUNTER — Emergency Department (HOSPITAL_COMMUNITY): Payer: Non-veteran care

## 2017-03-11 DIAGNOSIS — Z79899 Other long term (current) drug therapy: Secondary | ICD-10-CM | POA: Insufficient documentation

## 2017-03-11 DIAGNOSIS — Z87891 Personal history of nicotine dependence: Secondary | ICD-10-CM | POA: Insufficient documentation

## 2017-03-11 DIAGNOSIS — R112 Nausea with vomiting, unspecified: Secondary | ICD-10-CM | POA: Insufficient documentation

## 2017-03-11 DIAGNOSIS — J45909 Unspecified asthma, uncomplicated: Secondary | ICD-10-CM | POA: Insufficient documentation

## 2017-03-11 DIAGNOSIS — R1013 Epigastric pain: Secondary | ICD-10-CM | POA: Insufficient documentation

## 2017-03-11 LAB — CBC
HEMATOCRIT: 38.7 % (ref 36.0–46.0)
Hemoglobin: 13.4 g/dL (ref 12.0–15.0)
MCH: 31.5 pg (ref 26.0–34.0)
MCHC: 34.6 g/dL (ref 30.0–36.0)
MCV: 90.8 fL (ref 78.0–100.0)
Platelets: 117 10*3/uL — ABNORMAL LOW (ref 150–400)
RBC: 4.26 MIL/uL (ref 3.87–5.11)
RDW: 13 % (ref 11.5–15.5)
WBC: 19.9 10*3/uL — ABNORMAL HIGH (ref 4.0–10.5)

## 2017-03-11 LAB — COMPREHENSIVE METABOLIC PANEL
ALBUMIN: 4.3 g/dL (ref 3.5–5.0)
ALT: 16 U/L (ref 14–54)
AST: 28 U/L (ref 15–41)
Alkaline Phosphatase: 95 U/L (ref 38–126)
Anion gap: 12 (ref 5–15)
BILIRUBIN TOTAL: 0.7 mg/dL (ref 0.3–1.2)
BUN: 13 mg/dL (ref 6–20)
CALCIUM: 9.1 mg/dL (ref 8.9–10.3)
CO2: 20 mmol/L — ABNORMAL LOW (ref 22–32)
CREATININE: 1.03 mg/dL — AB (ref 0.44–1.00)
Chloride: 107 mmol/L (ref 101–111)
GFR calc Af Amer: >60 mL/min (ref >60)
GFR calc non Af Amer: >60 mL/min (ref >60)
GLUCOSE: 124 mg/dL — AB (ref 65–99)
Potassium: 3.8 mmol/L (ref 3.5–5.1)
Sodium: 139 mmol/L (ref 135–145)
TOTAL PROTEIN: 7.8 g/dL (ref 6.5–8.1)

## 2017-03-11 LAB — URINALYSIS, ROUTINE W REFLEX MICROSCOPIC
Bilirubin Urine: NEGATIVE
GLUCOSE, UA: NEGATIVE mg/dL
KETONES UR: 20 mg/dL — AB
Nitrite: NEGATIVE
PH: 5 (ref 5.0–8.0)
Protein, ur: NEGATIVE mg/dL
Specific Gravity, Urine: 1.023 (ref 1.005–1.030)

## 2017-03-11 LAB — I-STAT BETA HCG BLOOD, ED (MC, WL, AP ONLY): I-stat hCG, quantitative: <5 m[IU]/mL (ref <5)

## 2017-03-11 LAB — LIPASE, BLOOD: Lipase: 15 U/L (ref 11–51)

## 2017-03-11 MED ORDER — SODIUM CHLORIDE 0.9 % IV BOLUS (SEPSIS)
2000.0000 mL | Freq: Once | INTRAVENOUS | Status: AC
  Administered 2017-03-11: 2000 mL via INTRAVENOUS

## 2017-03-11 MED ORDER — METOCLOPRAMIDE HCL 5 MG/ML IJ SOLN
10.0000 mg | Freq: Once | INTRAMUSCULAR | Status: AC
  Administered 2017-03-11: 10 mg via INTRAVENOUS
  Filled 2017-03-11: qty 2

## 2017-03-11 MED ORDER — PROMETHAZINE HCL 25 MG RE SUPP: 25.0000 mg | Freq: Four times a day (QID) | RECTAL | 0 refills | Status: DC | PRN

## 2017-03-11 MED ORDER — METOCLOPRAMIDE HCL 10 MG PO TABS: 10.0000 mg | ORAL_TABLET | Freq: Four times a day (QID) | ORAL | 0 refills | Status: DC | PRN

## 2017-03-11 MED ORDER — PROMETHAZINE HCL 25 MG/ML IJ SOLN
12.5000 mg | Freq: Once | INTRAMUSCULAR | Status: AC
  Administered 2017-03-11: 12.5 mg via INTRAVENOUS
  Filled 2017-03-11: qty 1

## 2017-03-11 MED ORDER — LORAZEPAM 2 MG/ML IJ SOLN
1.0000 mg | Freq: Once | INTRAMUSCULAR | Status: AC
  Administered 2017-03-11: 1 mg via INTRAVENOUS
  Filled 2017-03-11: qty 1

## 2017-03-11 MED ORDER — METOCLOPRAMIDE HCL 5 MG/ML IJ SOLN
5.0000 mg | Freq: Once | INTRAMUSCULAR | Status: AC
  Administered 2017-03-11: 5 mg via INTRAVENOUS
  Filled 2017-03-11: qty 2

## 2017-03-11 MED ORDER — DIPHENHYDRAMINE HCL 50 MG/ML IJ SOLN
25.0000 mg | Freq: Once | INTRAMUSCULAR | Status: AC
  Administered 2017-03-11: 25 mg via INTRAVENOUS
  Filled 2017-03-11: qty 1

## 2017-03-11 MED ORDER — PANTOPRAZOLE SODIUM 40 MG IV SOLR
40.0000 mg | Freq: Once | INTRAVENOUS | Status: AC
  Administered 2017-03-11: 40 mg via INTRAVENOUS
  Filled 2017-03-11: qty 40

## 2017-03-11 MED ORDER — DIPHENHYDRAMINE HCL 50 MG/ML IJ SOLN: 25.0000 mg | Freq: Once | INTRAMUSCULAR | Status: DC

## 2017-03-11 NOTE — ED Triage Notes (Signed)
Pt. Arrived via [facility] from home for abdominal pain X 3 days. Worse about 3 am this morning. Unbarable. Hx. Of gastritis. Came to the ER in the past for upper gastric pain. Caused her n/v and usually some diarrhea. This time no diarrhea but only upper gastric pain. 10/10 pain. Vomited more then she could count since 3am this morning. Vomited twice with [facility]. Pt. Developed chest pain for about 30 sec on arrival. Pt. Now denies chest pain in ED room. 4 mg of zofran given via EMS. 22g left hand and 500 ml bolus given per EMS. 100 mcg fentanyl given via ems. [place] about every 4-6 months. Has a endoscoy done in the past and haven't found anything per pt. Pt. States usually caused by a bacteria. Pt is not prescribed for anything.

## 2017-03-11 NOTE — ED Provider Notes (Addendum)
[facility]-EMERGENCY DEPT Provider Note   CSN: [id number] Arrival date & time: 03/11/17  0746     History   Chief Complaint Chief Complaint  Patient presents with  . Abdominal Pain    HPI Brittney Khan is a 31 y.o. female.  HPI Complains of epigastric abdominal pain and vomiting multiple times onset 10 PM yesterday.  Last bowel movement was yesterday, normal.  No fever.  No hematemesis.  Symptoms worse with lying supine and improved with sitting upright.  She admits to drinking 1 alcoholic beverage yesterday.  No other associated symptoms.  EMS treated patient with Zofran prior to coming here.  She continues to vomit.  She had similar symptoms twice over the past year.  Last normal menstrual period last month.  Seen here September  2018 for similar complaints.  Symptoms eventually resolved. Past Medical History:  Diagnosis Date  . Asthma   . Hip fracture, right [facility](HCC) 2010    Patient Active Problem List   Diagnosis Date Noted  . Left knee pain 08/06/2015  . Nausea vomiting and diarrhea 12/23/2012  . Infectious colitis 12/23/2012    History reviewed. No pertinent surgical history.  OB History    No data available       Home Medications    Prior to Admission medications   Medication Sig Start Date End Date Taking? Authorizing Provider  albuterol (PROVENTIL HFA;VENTOLIN HFA) 108 (90 BASE) MCG/ACT inhaler Inhale 1-2 puffs into the lungs every 6 (six) hours as needed for wheezing or shortness of breath.    [provider]  albuterol (PROVENTIL) (2.5 MG/3ML) 0.083% nebulizer solution Take 3 mLs (2.5 mg total) by nebulization every 6 (six) hours as needed for wheezing or shortness of breath. 11/30/13   Mellody DrownParker, Lauren, PA-C  alum & mag hydroxide-simeth (MAALOX ADVANCED MAX ST) 400-400-40 MG/5ML suspension Take 10 mLs by mouth every 6 (six) hours as needed for indigestion. 12/19/16   Liberty HandyGibbons, Claudia J, PA-C  omeprazole (PRILOSEC) 20 MG capsule  Take one capsule daily at least 30 minutes before first dose of Carafate. 12/20/16   Molpus, Jonny RuizJohn, MD  promethazine (PHENERGAN) 25 MG tablet Take 1 tablet (25 mg total) by mouth every 6 (six) hours as needed for nausea or vomiting. 12/20/16   Molpus, Jonny RuizJohn, MD  sucralfate (CARAFATE) 1 g tablet Take 1 tablet (1 g total) by mouth 4 (four) times daily -  with meals and at bedtime. 12/20/16   Molpus, Jonny RuizJohn, MD    Family History History reviewed. No pertinent family history.  Social History Social History   Tobacco Use  . Smoking status: Former Smoker    Last attempt to quit: 04/05/2010    Years since quitting: 6.9  . Smokeless tobacco: Never Used  Substance Use Topics  . Alcohol use: Yes    Alcohol/week: 0.0 oz    Comment: socially  . Drug use: No     Allergies   Reglan [metoclopramide]   Review of Systems Review of Systems  Gastrointestinal: Positive for abdominal pain, nausea and vomiting.  All other systems reviewed and are negative.    Physical Exam Updated Vital Signs BP (!) 138/92 Comment: Simultaneous filing. User may not have seen previous data.  Pulse (!) 52 Comment: Simultaneous filing. User may not have seen previous data.  Temp (!) 96 F (35.6 C) (Axillary)   Resp 14   SpO2 100% Comment: Simultaneous filing. User may not have seen previous data.  Physical Exam  Constitutional: She appears well-developed and  well-nourished. She appears distressed.  Appears moderately uncomfortable  HENT:  Head: Normocephalic and atraumatic.  Mucous membranes dry  Eyes: Conjunctivae are normal. Pupils are equal, round, and reactive to light.  Neck: Neck supple. No tracheal deviation present. No thyromegaly present.  Cardiovascular: Normal rate and regular rhythm.  No murmur heard. Pulmonary/Chest: Effort normal and breath sounds normal.  Abdominal: Soft. Bowel sounds are normal. She exhibits no distension. There is tenderness.  tender at epigastrium  Musculoskeletal: Normal  range of motion. She exhibits no edema or tenderness.  Neurological: She is alert. Coordination normal.  Skin: Skin is warm and dry. Capillary refill takes less than 2 seconds. No rash noted.  Psychiatric: She has a normal mood and affect.  Nursing note and vitals reviewed.   X-rays viewed by me Results for orders placed or performed during the hospital encounter of 03/11/17  Lipase, blood  Result Value Ref Range   Lipase 15 11 - 51 U/L  Comprehensive metabolic panel  Result Value Ref Range   Sodium 139 135 - 145 mmol/L   Potassium 3.8 3.5 - 5.1 mmol/L   Chloride 107 101 - 111 mmol/L   CO2 20 (L) 22 - 32 mmol/L   Glucose, Bld 124 (H) 65 - 99 mg/dL   BUN 13 6 - 20 mg/dL   Creatinine, Ser 3.241.03 (H) 0.44 - 1.00 mg/dL   Calcium 9.1 8.9 - 40.110.3 mg/dL   Total Protein 7.8 6.5 - 8.1 g/dL   Albumin 4.3 3.5 - 5.0 g/dL   AST 28 15 - 41 U/L   ALT 16 14 - 54 U/L   Alkaline Phosphatase 95 38 - 126 U/L   Total Bilirubin 0.7 0.3 - 1.2 mg/dL   GFR calc non Af Amer >60 >60 mL/min   GFR calc Af Amer >60 >60 mL/min   Anion gap 12 5 - 15  CBC  Result Value Ref Range   WBC 19.9 (H) 4.0 - 10.5 K/uL   RBC 4.26 3.87 - 5.11 MIL/uL   Hemoglobin 13.4 12.0 - 15.0 g/dL   HCT 02.738.7 25.336.0 - 66.446.0 %   MCV 90.8 78.0 - 100.0 fL   MCH 31.5 26.0 - 34.0 pg   MCHC 34.6 30.0 - 36.0 g/dL   RDW 40.313.0 47.411.5 - 25.915.5 %   Platelets 117 (L) 150 - 400 K/uL  Urinalysis, Routine w reflex microscopic  Result Value Ref Range   Color, Urine YELLOW YELLOW   APPearance CLOUDY (A) CLEAR   Specific Gravity, Urine 1.023 1.005 - 1.030   pH 5.0 5.0 - 8.0   Glucose, UA NEGATIVE NEGATIVE mg/dL   Hgb urine dipstick LARGE (A) NEGATIVE   Bilirubin Urine NEGATIVE NEGATIVE   Ketones, ur 20 (A) NEGATIVE mg/dL   Protein, ur NEGATIVE NEGATIVE mg/dL   Nitrite NEGATIVE NEGATIVE   Leukocytes, UA MODERATE (A) NEGATIVE   RBC / HPF 0-5 0 - 5 RBC/hpf   WBC, UA 6-30 0 - 5 WBC/hpf   Bacteria, UA MANY (A) NONE SEEN   Squamous Epithelial / LPF  6-30 (A) NONE SEEN   Mucus PRESENT   I-Stat Beta hCG blood, ED ([facility], [facility], [facility] only)  Result Value Ref Range   I-stat hCG, quantitative <5.0 <5 mIU/mL   Comment 3           Dg Abd Acute W/chest  Result Date: 03/11/2017 CLINICAL DATA:  Epigastric pain and vomiting. EXAM: DG ABDOMEN ACUTE W/ 1V CHEST COMPARISON:  12/19/2016 FINDINGS: Relatively gasless abdomen. Some gastric  and pelvic gas is seen. No visible dilated opacified bowel. No concerning mass effect or gas collection. No calcifications. Normal heart size and mediastinal contours. No acute infiltrate or edema. No effusion or pneumothorax. No acute osseous findings. IMPRESSION: 1. Negative abdominal radiographs with relatively gasless abdomen. 2. Negative chest. Electronically Signed   By: Marnee Spring M.D.   On: 03/11/2017 09:51   ED Treatments / Results  Labs (all labs ordered are listed, but only abnormal results are displayed) Labs Reviewed  LIPASE, BLOOD  COMPREHENSIVE METABOLIC PANEL  CBC  URINALYSIS, ROUTINE W REFLEX MICROSCOPIC  I-STAT BETA HCG BLOOD, ED ([facility], [facility], [facility] ONLY)    EKG  EKG Interpretation None       Radiology No results found.  Procedures Procedures (including critical care time)  Medications Ordered in ED Medications  metoCLOPramide (REGLAN) injection 10 mg (not administered)  LORazepam (ATIVAN) injection 1 mg (not administered)  sodium chloride 0.9 % bolus 2,000 mL (not administered)  pantoprazole (PROTONIX) injection 40 mg (not administered)     Initial Impression / Assessment and Plan / ED Course  I have reviewed the triage vital signs and the nursing notes.  Pertinent labs & imaging results that were available during my care of the patient were reviewed by me and considered in my medical decision making (see chart for details).     10:10 aM feels much improved after treatment with intravenous Reglan and Ativan and intravenous hydration with normal saline.  Continues to complain of mild  nausea.  Additional Reglan and intravenous Benadryl ordered.  12:50 PM patient reports that she vomited again.  IV Phenergan ordered. 1:30 PM she drank water without nausea or vomiting.  She feels ready to go home. After treatment with intravenous PPI, IV hydration and antiemetics Leukocytosis appears to be chronic.  Plan prescription Reglan, Phenergan suppositories.  Prilosec OTC.  Referral [facility] gastroenterology  Suspect the patient has gastritis versus peptic ulcer disease.  She has had multiple CT scans.  Of note patient not felt to be allergic to Reglan.  She tolerated Reglan well while here. Final Clinical Impressions(s) / ED Diagnoses  Diagnoses #1 epigastric pain #2 nausea and vomiting Final diagnoses:  None    ED Discharge Orders    None       Doug Sou, MD 03/11/17 1337    Doug Sou, MD 03/11/17 1339

## 2017-03-11 NOTE — Discharge Instructions (Signed)
Call [facility] or your doctor at the [facility] if abdominal pain and nausea and vomiting continues.  No alcohol.  Stay on clear liquids for the next 24 hours.  Take Prilosec OTC as directed.  Prilosec OTC does not require a prescription.  You can use the medicine prescribed as needed for nausea or vomiting.  Return if your condition worsens or if unable to hold down fluids without vomiting or if concern for any reason

## 2017-03-11 NOTE — ED Notes (Signed)
Bed: [id number] Expected date:  Expected time:  Means of arrival:  Comments: 31 yo F  Abd pain

## 2017-03-11 NOTE — ED Notes (Signed)
PT went to restroom unable to provide sample at this time

## 2017-03-11 NOTE — ED Notes (Signed)
Pt. Was given water via nurse tech. and pt. States she threw it all up in the bathroom.

## 2017-03-11 NOTE — ED Notes (Signed)
Pt. Tolerated PO fluids.

## 2017-03-12 ENCOUNTER — Encounter (HOSPITAL_BASED_OUTPATIENT_CLINIC_OR_DEPARTMENT_OTHER): Payer: Self-pay | Admitting: Emergency Medicine

## 2017-03-12 ENCOUNTER — Encounter (HOSPITAL_COMMUNITY): Payer: Self-pay

## 2017-03-12 ENCOUNTER — Emergency Department (HOSPITAL_BASED_OUTPATIENT_CLINIC_OR_DEPARTMENT_OTHER): Payer: Non-veteran care

## 2017-03-12 ENCOUNTER — Emergency Department (HOSPITAL_COMMUNITY)
Admission: EM | Admit: 2017-03-12 | Discharge: 2017-03-12 | Disposition: A | Discharge status: Home / Self Care | Payer: Non-veteran care | Source: Home / Self Care

## 2017-03-12 ENCOUNTER — Other Ambulatory Visit: Payer: Self-pay

## 2017-03-12 ENCOUNTER — Emergency Department (HOSPITAL_BASED_OUTPATIENT_CLINIC_OR_DEPARTMENT_OTHER)
Admission: EM | Admit: 2017-03-12 | Discharge: 2017-03-12 | Disposition: A | Discharge status: Home / Self Care | Payer: Non-veteran care | Attending: Emergency Medicine | Admitting: Emergency Medicine

## 2017-03-12 DIAGNOSIS — R109 Unspecified abdominal pain: Secondary | ICD-10-CM | POA: Diagnosis not present

## 2017-03-12 DIAGNOSIS — R531 Weakness: Secondary | ICD-10-CM

## 2017-03-12 DIAGNOSIS — Z87891 Personal history of nicotine dependence: Secondary | ICD-10-CM | POA: Diagnosis not present

## 2017-03-12 DIAGNOSIS — Z5321 Procedure and treatment not carried out due to patient leaving prior to being seen by health care provider: Secondary | ICD-10-CM | POA: Insufficient documentation

## 2017-03-12 DIAGNOSIS — F1210 Cannabis abuse, uncomplicated: Secondary | ICD-10-CM | POA: Insufficient documentation

## 2017-03-12 DIAGNOSIS — J45909 Unspecified asthma, uncomplicated: Secondary | ICD-10-CM | POA: Diagnosis not present

## 2017-03-12 DIAGNOSIS — F12188 Cannabis abuse with other cannabis-induced disorder: Secondary | ICD-10-CM | POA: Diagnosis not present

## 2017-03-12 DIAGNOSIS — R1110 Vomiting, unspecified: Secondary | ICD-10-CM | POA: Insufficient documentation

## 2017-03-12 LAB — COMPREHENSIVE METABOLIC PANEL
ALK PHOS: 97 U/L (ref 38–126)
ALT: 14 U/L (ref 14–54)
AST: 21 U/L (ref 15–41)
Albumin: 3.9 g/dL (ref 3.5–5.0)
Anion gap: 12 (ref 5–15)
BUN: 6 mg/dL (ref 6–20)
CALCIUM: 9.2 mg/dL (ref 8.9–10.3)
CHLORIDE: 101 mmol/L (ref 101–111)
CO2: 24 mmol/L (ref 22–32)
CREATININE: 0.97 mg/dL (ref 0.44–1.00)
Glucose, Bld: 102 mg/dL — ABNORMAL HIGH (ref 65–99)
Potassium: 3.3 mmol/L — ABNORMAL LOW (ref 3.5–5.1)
Sodium: 137 mmol/L (ref 135–145)
Total Bilirubin: 0.7 mg/dL (ref 0.3–1.2)
Total Protein: 7.2 g/dL (ref 6.5–8.1)

## 2017-03-12 LAB — CBC
HCT: 37.5 % (ref 36.0–46.0)
Hemoglobin: 13 g/dL (ref 12.0–15.0)
MCH: 31.3 pg (ref 26.0–34.0)
MCHC: 34.7 g/dL (ref 30.0–36.0)
MCV: 90.4 fL (ref 78.0–100.0)
PLATELETS: 277 10*3/uL (ref 150–400)
RBC: 4.15 MIL/uL (ref 3.87–5.11)
RDW: 12.9 % (ref 11.5–15.5)
WBC: 18.7 10*3/uL — AB (ref 4.0–10.5)

## 2017-03-12 LAB — I-STAT BETA HCG BLOOD, ED (MC, WL, AP ONLY)

## 2017-03-12 LAB — LIPASE, BLOOD: LIPASE: 44 U/L (ref 11–51)

## 2017-03-12 LAB — RAPID URINE DRUG SCREEN, HOSP PERFORMED
Amphetamines: NOT DETECTED
BARBITURATES: NOT DETECTED
Benzodiazepines: POSITIVE — AB
Cocaine: NOT DETECTED
Opiates: NOT DETECTED
TETRAHYDROCANNABINOL: POSITIVE — AB

## 2017-03-12 MED ORDER — PANTOPRAZOLE SODIUM 40 MG IV SOLR
INTRAVENOUS | Status: AC
  Administered 2017-03-12: 40 mg via INTRAVENOUS
  Filled 2017-03-12: qty 40

## 2017-03-12 MED ORDER — KETOROLAC TROMETHAMINE 30 MG/ML IJ SOLN
15.0000 mg | Freq: Once | INTRAMUSCULAR | Status: AC
  Administered 2017-03-12: 15 mg via INTRAVENOUS
  Filled 2017-03-12: qty 1

## 2017-03-12 MED ORDER — PANTOPRAZOLE SODIUM 40 MG IV SOLR
40.0000 mg | Freq: Once | INTRAVENOUS | Status: AC
  Administered 2017-03-12: 40 mg via INTRAVENOUS

## 2017-03-12 MED ORDER — SODIUM CHLORIDE 0.9 % IV BOLUS (SEPSIS)
1000.0000 mL | Freq: Once | INTRAVENOUS | Status: AC
Start: 2017-03-12 — End: 2017-03-12
  Administered 2017-03-12: 1000 mL via INTRAVENOUS

## 2017-03-12 MED ORDER — SUCRALFATE 1 GM/10ML PO SUSP: 1.0000 g | Freq: Three times a day (TID) | ORAL | 0 refills | Status: DC

## 2017-03-12 MED ORDER — HALOPERIDOL LACTATE 5 MG/ML IJ SOLN
5.0000 mg | Freq: Once | INTRAMUSCULAR | Status: AC
  Administered 2017-03-12: 5 mg via INTRAVENOUS
  Filled 2017-03-12: qty 1

## 2017-03-12 MED ORDER — GI COCKTAIL ~~LOC~~
30.0000 mL | Freq: Once | ORAL | Status: AC
  Administered 2017-03-12: 30 mL via ORAL
  Filled 2017-03-12: qty 30

## 2017-03-12 NOTE — ED Notes (Signed)
Pt ambulatory to bathroom with assistance.

## 2017-03-12 NOTE — ED Provider Notes (Addendum)
[facility] EMERGENCY DEPARTMENT Provider Note   CSN: [id number] Arrival date & time: 03/12/17  0243     History   Chief Complaint Chief Complaint  Patient presents with  . Emesis    HPI Brittney Khan is a 31 y.o. female.  The history is provided by the patient and a parent.  Emesis   This is a recurrent problem. The current episode started more than 2 days ago. The problem has not changed since onset.The emesis has an appearance of stomach contents. There has been no fever. Associated symptoms include abdominal pain. Pertinent negatives include no diarrhea, no fever and no sweats.  Patient was seen on the am of 12/7 for this and checked into the ED at [facility] for same but the wait was too long.  Patient and mother state that " nothing was done yesterday."  Patient cannot keep food down but states she did not fill her RX for phenergan.  She was seen in September for the same issue and states she has seen GI at the [facility] for same in the the past year and had an endoscopy but they did not find anything.    Past Medical History:  Diagnosis Date  . Asthma   . Hip fracture, right [facility]) 2010    Patient Active Problem List   Diagnosis Date Noted  . Left knee pain 08/06/2015  . Nausea vomiting and diarrhea 12/23/2012  . Infectious colitis 12/23/2012    History reviewed. No pertinent surgical history.  OB History    No data available       Home Medications    Prior to Admission medications   Medication Sig Start Date End Date Taking? Authorizing Provider  albuterol (PROVENTIL HFA;VENTOLIN HFA) 108 (90 BASE) MCG/ACT inhaler Inhale 1-2 puffs into the lungs every 6 (six) hours as needed for wheezing or shortness of breath.    [provider]  albuterol (PROVENTIL) (2.5 MG/3ML) 0.083% nebulizer solution Take 3 mLs (2.5 mg total) by nebulization every 6 (six) hours as needed for wheezing or shortness of breath. 11/30/13   Mellody Drown, PA-C  alum & mag  hydroxide-simeth (MAALOX ADVANCED MAX ST) 400-400-40 MG/5ML suspension Take 10 mLs by mouth every 6 (six) hours as needed for indigestion. Patient not taking: Reported on 03/11/2017 12/19/16   Liberty Handy, PA-C  metoCLOPramide (REGLAN) 10 MG tablet Take 1 tablet (10 mg total) by mouth every 6 (six) hours as needed for nausea. 03/11/17   Doug Sou, MD  omeprazole (PRILOSEC) 20 MG capsule Take one capsule daily at least 30 minutes before first dose of Carafate. Patient not taking: Reported on 03/11/2017 12/20/16   Molpus, Jonny Ruiz, MD  promethazine (PHENERGAN) 25 MG suppository Place 1 suppository (25 mg total) rectally every 6 (six) hours as needed for nausea or vomiting. 03/11/17   Doug Sou, MD  sucralfate (CARAFATE) 1 g tablet Take 1 tablet (1 g total) by mouth 4 (four) times daily -  with meals and at bedtime. Patient not taking: Reported on 03/11/2017 12/20/16   Molpus, Jonny Ruiz, MD    Family History No family history on file.  Social History Social History   Tobacco Use  . Smoking status: Former Smoker    Last attempt to quit: 04/05/2010    Years since quitting: 6.9  . Smokeless tobacco: Never Used  Substance Use Topics  . Alcohol use: Yes    Alcohol/week: 0.0 oz    Comment: socially  . Drug use: No  Allergies   Reglan [metoclopramide]   Review of Systems Review of Systems  Constitutional: Negative for fever.  Gastrointestinal: Positive for abdominal pain, nausea and vomiting. Negative for abdominal distention, anal bleeding, blood in stool, constipation and diarrhea.  Genitourinary: Negative for dysuria, flank pain and hematuria.  All other systems reviewed and are negative.    Physical Exam Updated Vital Signs BP 125/72 (BP Location: Left Arm)   Pulse 76   Temp 98.3 F (36.8 C) (Oral)   Resp 16   Ht 5\' 5"  (1.651 m)   Wt 79.4 kg (175 lb)   SpO2 100%   BMI 29.12 kg/m   Physical Exam  Constitutional: She is oriented to person, place, and time. She  appears well-developed and well-nourished. No distress.  HENT:  Head: Normocephalic and atraumatic.  Mouth/Throat: Oropharynx is clear and moist.  Eyes: Conjunctivae are normal. Pupils are equal, round, and reactive to light.  Neck: Normal range of motion. Neck supple.  Cardiovascular: Normal rate, regular rhythm, normal heart sounds and intact distal pulses.  Pulmonary/Chest: Effort normal and breath sounds normal. No stridor. No respiratory distress. She has no wheezes. She has no rales.  Abdominal: Soft. Bowel sounds are normal. She exhibits no distension and no mass. There is no tenderness. There is no rebound and no guarding.  Musculoskeletal: Normal range of motion.  Neurological: She is alert and oriented to person, place, and time.  Skin: Skin is warm and dry. Capillary refill takes less than 2 seconds.  Psychiatric: She has a normal mood and affect.  Nursing note and vitals reviewed.    ED Treatments / Results  Labs (all labs ordered are listed, but only abnormal results are displayed) Labs Reviewed  RAPID URINE DRUG SCREEN, HOSP PERFORMED    EKG  EKG Interpretation None       Radiology Dg Abd Acute W/chest  Result Date: 03/11/2017 CLINICAL DATA:  Epigastric pain and vomiting. EXAM: DG ABDOMEN ACUTE W/ 1V CHEST COMPARISON:  12/19/2016 FINDINGS: Relatively gasless abdomen. Some gastric and pelvic gas is seen. No visible dilated opacified bowel. No concerning mass effect or gas collection. No calcifications. Normal heart size and mediastinal contours. No acute infiltrate or edema. No effusion or pneumothorax. No acute osseous findings. IMPRESSION: 1. Negative abdominal radiographs with relatively gasless abdomen. 2. Negative chest. Electronically Signed   By: Marnee SpringJonathon  Watts M.D.   On: 03/11/2017 09:51    Procedures Procedures (including critical care time)  Medications Ordered in ED Medications  haloperidol lactate (HALDOL) injection 5 mg (5 mg Intravenous Given  03/12/17 0311)  sodium chloride 0.9 % bolus 1,000 mL (1,000 mLs Intravenous New Bag/Given 03/12/17 0310)  ketorolac (TORADOL) 30 MG/ML injection 15 mg (15 mg Intravenous Given 03/12/17 0311)  gi cocktail (Maalox,Lidocaine,Donnatal) (30 mLs Oral Given 03/12/17 0311)  pantoprazole (PROTONIX) injection 40 mg (40 mg Intravenous Given 03/12/17 [id number])    Given the recent frequency of ED visits EDP sent UDS.  Positive for marijuana.  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  UDS supports this.      Asked patient if it was acceptable to speak about her condition with family present twice.  Patient gave verbal permission for to speak with family present.  EDP spoke at length about the need to stop using marijuana.     Patient was given haldol with good effect.  I do not feel it is wise to give narcotics or benzos to this patient.  She  needs to stop using marijuana and follow up with her family doctor and GI for ongoing issues.   Final Clinical Impressions(s) / ED Diagnoses   Strict return precautions for facial swelling change in thinking or speech, fevers,  global weakness, vomiting, swelling or the lips or tongue, chest pain, dyspnea on exertion,shortness of breath, persistent pain, Inability to tolerate liquids or food, cough, altered mental status or any concerns. No signs of systemic illness or infection. The patient is nontoxic-appearing on exam and vital signs are within normal limits.  [facility]  Stop using marijuana.   I have reviewed the triage vital signs and the nursing notes. Pertinent labs &imaging results that were available during my care of the patient were reviewed by me and considered in my medical decision making (see chart for details).  After history, exam, and medical workup I feel the patient has been appropriately medically screened and is safe for discharge home.  Pertinent diagnoses were discussed with the patient. Patient was given return precautions     Yaakov Saindon, MD 03/12/17 [id number]    Nicanor AlconPalumbo, Rylee Nuzum, MD 03/12/17 [id number]

## 2017-03-12 NOTE — ED Triage Notes (Signed)
Pt states that she been vomiting since yesterday and now feels weak. [facility]

## 2017-03-12 NOTE — ED Notes (Signed)
Pt states she is leaving due to wait.  Encouraged her to stay to be seen and she declined.

## 2017-03-12 NOTE — ED Notes (Signed)
ED Provider at bedside. 

## 2017-03-12 NOTE — ED Notes (Signed)
Patient transported to CT 

## 2017-03-12 NOTE — ED Notes (Signed)
Pt given sprite and instructed to take small sips.

## 2017-03-12 NOTE — ED Triage Notes (Signed)
Pt abd pain with vomiting. Pt seen for same yesterday. Pt states she isn't feeling any better. Pt states she can't keep medications down. Pt did  Not get phenergan suppository rx filled.

## 2017-04-19 ENCOUNTER — Other Ambulatory Visit: Payer: Self-pay

## 2017-04-19 ENCOUNTER — Encounter (HOSPITAL_BASED_OUTPATIENT_CLINIC_OR_DEPARTMENT_OTHER): Payer: Self-pay | Admitting: Emergency Medicine

## 2017-04-19 ENCOUNTER — Emergency Department (HOSPITAL_BASED_OUTPATIENT_CLINIC_OR_DEPARTMENT_OTHER): Payer: Non-veteran care

## 2017-04-19 ENCOUNTER — Emergency Department (HOSPITAL_BASED_OUTPATIENT_CLINIC_OR_DEPARTMENT_OTHER)
Admission: EM | Admit: 2017-04-19 | Discharge: 2017-04-19 | Disposition: A | Discharge status: Home / Self Care | Payer: Non-veteran care | Attending: Emergency Medicine | Admitting: Emergency Medicine

## 2017-04-19 DIAGNOSIS — Z87891 Personal history of nicotine dependence: Secondary | ICD-10-CM | POA: Insufficient documentation

## 2017-04-19 DIAGNOSIS — R091 Pleurisy: Secondary | ICD-10-CM | POA: Diagnosis not present

## 2017-04-19 DIAGNOSIS — Z79899 Other long term (current) drug therapy: Secondary | ICD-10-CM | POA: Insufficient documentation

## 2017-04-19 DIAGNOSIS — J45909 Unspecified asthma, uncomplicated: Secondary | ICD-10-CM | POA: Diagnosis not present

## 2017-04-19 MED ORDER — BENZONATATE 100 MG PO CAPS: 100.0000 mg | ORAL_CAPSULE | Freq: Three times a day (TID) | ORAL | 0 refills | Status: DC

## 2017-04-19 NOTE — ED Provider Notes (Signed)
[facility] EMERGENCY DEPARTMENT Provider Note   CSN: [id number] Arrival date & time: 04/19/17  1651     History   Chief Complaint Chief Complaint  Patient presents with  . Chest Pain    HPI Brittney Khan is a 32 y.o. female. Chief complaint is chest pain.  HPI 32 year old black female. Complains of right mid anterior chest pain. Does radiate to her back. Sharp. Pleuritic. No shortness of breath. No history or risk factors for DVT or PE. Has had a recent cough. No fever. No hemoptysis. No URI symptoms.  Past Medical History:  Diagnosis Date  . Asthma   . Hip fracture, right [facility]) 2010    Patient Active Problem List   Diagnosis Date Noted  . Left knee pain 08/06/2015  . Nausea vomiting and diarrhea 12/23/2012  . Infectious colitis 12/23/2012    History reviewed. No pertinent surgical history.  OB History    No data available       Home Medications    Prior to Admission medications   Medication Sig Start Date End Date Taking? Authorizing Provider  albuterol (PROVENTIL HFA;VENTOLIN HFA) 108 (90 BASE) MCG/ACT inhaler Inhale 1-2 puffs into the lungs every 6 (six) hours as needed for wheezing or shortness of breath.    [provider]  albuterol (PROVENTIL) (2.5 MG/3ML) 0.083% nebulizer solution Take 3 mLs (2.5 mg total) by nebulization every 6 (six) hours as needed for wheezing or shortness of breath. 11/30/13   Mellody Drown, PA-C  alum & mag hydroxide-simeth (MAALOX ADVANCED MAX ST) 400-400-40 MG/5ML suspension Take 10 mLs by mouth every 6 (six) hours as needed for indigestion. Patient not taking: Reported on 03/11/2017 12/19/16   Liberty Handy, PA-C  benzonatate (TESSALON) 100 MG capsule Take 1 capsule (100 mg total) by mouth every 8 (eight) hours. 04/19/17   Rolland Porter, MD  metoCLOPramide (REGLAN) 10 MG tablet Take 1 tablet (10 mg total) by mouth every 6 (six) hours as needed for nausea. 03/11/17   Doug Sou, MD  omeprazole (PRILOSEC)  20 MG capsule Take one capsule daily at least 30 minutes before first dose of Carafate. Patient not taking: Reported on 03/11/2017 12/20/16   Molpus, Jonny Ruiz, MD  promethazine (PHENERGAN) 25 MG suppository Place 1 suppository (25 mg total) rectally every 6 (six) hours as needed for nausea or vomiting. 03/11/17   Doug Sou, MD  sucralfate (CARAFATE) 1 g tablet Take 1 tablet (1 g total) by mouth 4 (four) times daily -  with meals and at bedtime. Patient not taking: Reported on 03/11/2017 12/20/16   Molpus, Jonny Ruiz, MD  sucralfate (CARAFATE) 1 GM/10ML suspension Take 10 mLs (1 g total) by mouth 4 (four) times daily -  with meals and at bedtime. 03/12/17   Palumbo, April, MD    Family History History reviewed. No pertinent family history.  Social History Social History   Tobacco Use  . Smoking status: Former Smoker    Last attempt to quit: 04/05/2010    Years since quitting: 7.0  . Smokeless tobacco: Never Used  Substance Use Topics  . Alcohol use: Yes    Alcohol/week: 0.0 oz    Comment: socially  . Drug use: Yes    Types: Marijuana    Comment: x 1 month drug free      Allergies   Reglan [metoclopramide]   Review of Systems Review of Systems  Constitutional: Negative for appetite change, chills, diaphoresis, fatigue and fever.  HENT: Negative for mouth sores, sore throat and  trouble swallowing.   Eyes: Negative for visual disturbance.  Respiratory: Negative for cough, chest tightness, shortness of breath and wheezing.   Cardiovascular: Positive for chest pain.  Gastrointestinal: Negative for abdominal distention, abdominal pain, diarrhea, nausea and vomiting.  Endocrine: Negative for polydipsia, polyphagia and polyuria.  Genitourinary: Negative for dysuria, frequency and hematuria.  Musculoskeletal: Negative for gait problem.  Skin: Negative for color change, pallor and rash.  Neurological: Negative for dizziness, syncope, light-headedness and headaches.  Hematological: Does not  bruise/bleed easily.  Psychiatric/Behavioral: Negative for behavioral problems and confusion.     Physical Exam Updated Vital Signs BP 129/82 (BP Location: Left Arm)   Pulse 75   Temp 97.9 F (36.6 C) (Oral)   Resp 18   Ht 5\' 5"  (1.651 m)   Wt 77.1 kg (170 lb)   LMP 04/05/2017   SpO2 100%   BMI 28.29 kg/m   Physical Exam  Constitutional: She is oriented to person, place, and time. She appears well-developed and well-nourished. No distress.  HENT:  Head: Normocephalic.  Eyes: Conjunctivae are normal. Pupils are equal, round, and reactive to light. No scleral icterus.  Neck: Normal range of motion. Neck supple. No thyromegaly present.  Cardiovascular: Normal rate and regular rhythm. Exam reveals no gallop and no friction rub.  No murmur heard. Pulmonary/Chest: Effort normal and breath sounds normal. No respiratory distress. She has no wheezes. She has no rales.  No rash across the chest wall. Nontender over the chest or back. Clear bilateral breath sounds. No wheezing rales rhonchi. No pleural or pericardial friction rubs.  Abdominal: Soft. Bowel sounds are normal. She exhibits no distension. There is no tenderness. There is no rebound.  Musculoskeletal: Normal range of motion.  Neurological: She is alert and oriented to person, place, and time.  Skin: Skin is warm and dry. No rash noted.  Psychiatric: She has a normal mood and affect. Her behavior is normal.     ED Treatments / Results  Labs (all labs ordered are listed, but only abnormal results are displayed) Labs Reviewed - No data to display  EKG  EKG Interpretation  Date/Time:  Tuesday April 19 2017 16:54:51 EST Ventricular Rate:  70 PR Interval:  110 QRS Duration: 82 QT Interval:  398 QTC Calculation: 429 R Axis:   71 Text Interpretation:  Sinus rhythm with short PR  t-wave abnormalities present on prior tracings Abnormal ECG Confirmed by Rolland PorterJames, Ja Pistole ([id number]) on 04/19/2017 5:01:34 PM       Radiology Dg  Chest 2 View  Result Date: 04/19/2017 CLINICAL DATA:  Central chest pain and feeling of needing to take a deep breath x 2 days; H/O asthma EXAM: CHEST  2 VIEW COMPARISON:  03/11/2017 FINDINGS: The heart size and mediastinal contours are within normal limits. Both lungs are clear. No pleural effusion or pneumothorax. The visualized skeletal structures are unremarkable. IMPRESSION: Normal chest radiographs. Electronically Signed   By: Amie Portlandavid  Ormond M.D.   On: 04/19/2017 17:39    Procedures Procedures (including critical care time)  Medications Ordered in ED Medications - No data to display   Initial Impression / Assessment and Plan / ED Course  I have reviewed the triage vital signs and the nursing notes.  Pertinent labs & imaging results that were available during my care of the patient were reviewed by me and considered in my medical decision making (see chart for details).    Normal chest x-ray. No pneumothorax or infiltrate. EKG was flattened T waves diffusely. Noted on  prior EKG. No obvious acute or ischemic changes. Not tachycardic. She is afebrile. Symptoms consistent with pleurisy likely viral in nature. Plan Tessalon. When necessary NSAIDs. Primary care follow-up. ER acute changes.   Final Clinical Impressions(s) / ED Diagnoses   Final diagnoses:  Pleurisy    ED Discharge Orders        Ordered    benzonatate (TESSALON) 100 MG capsule  Every 8 hours     04/19/17 1808       Rolland Porter, MD 04/19/17 1810

## 2017-04-19 NOTE — Discharge Instructions (Signed)
Tessalon for cough. Ibuprofen. 400 mg 2-3 times per day as needed. Take with food.

## 2017-04-19 NOTE — ED Triage Notes (Signed)
Patient states that she has had chest pain x 3 days  - the patient reports that she is having pain to her mid chest that is sharp and shooting

## 2018-02-26 ENCOUNTER — Encounter (HOSPITAL_COMMUNITY): Payer: Self-pay | Admitting: *Deleted

## 2018-02-26 ENCOUNTER — Emergency Department (HOSPITAL_COMMUNITY)
Admission: EM | Admit: 2018-02-26 | Discharge: 2018-02-26 | Disposition: A | Discharge status: Home / Self Care | Payer: Self-pay | Attending: Emergency Medicine | Admitting: Emergency Medicine

## 2018-02-26 DIAGNOSIS — R112 Nausea with vomiting, unspecified: Secondary | ICD-10-CM | POA: Insufficient documentation

## 2018-02-26 DIAGNOSIS — Z79899 Other long term (current) drug therapy: Secondary | ICD-10-CM | POA: Insufficient documentation

## 2018-02-26 DIAGNOSIS — R1013 Epigastric pain: Secondary | ICD-10-CM | POA: Insufficient documentation

## 2018-02-26 DIAGNOSIS — Z87891 Personal history of nicotine dependence: Secondary | ICD-10-CM | POA: Insufficient documentation

## 2018-02-26 DIAGNOSIS — J45909 Unspecified asthma, uncomplicated: Secondary | ICD-10-CM | POA: Insufficient documentation

## 2018-02-26 LAB — COMPREHENSIVE METABOLIC PANEL
ALBUMIN: 4 g/dL (ref 3.5–5.0)
ALT: 15 U/L (ref 0–44)
ANION GAP: 12 (ref 5–15)
AST: 25 U/L (ref 15–41)
Alkaline Phosphatase: 79 U/L (ref 38–126)
BILIRUBIN TOTAL: 1 mg/dL (ref 0.3–1.2)
BUN: 13 mg/dL (ref 6–20)
CHLORIDE: 102 mmol/L (ref 98–111)
CO2: 23 mmol/L (ref 22–32)
Calcium: 9.6 mg/dL (ref 8.9–10.3)
Creatinine, Ser: 1 mg/dL (ref 0.44–1.00)
GFR calc Af Amer: >60 mL/min (ref >60)
GFR calc non Af Amer: >60 mL/min (ref >60)
GLUCOSE: 120 mg/dL — AB (ref 70–99)
POTASSIUM: 4.2 mmol/L (ref 3.5–5.1)
SODIUM: 137 mmol/L (ref 135–145)
TOTAL PROTEIN: 7.1 g/dL (ref 6.5–8.1)

## 2018-02-26 LAB — CBC WITH DIFFERENTIAL/PLATELET
Abs Immature Granulocytes: 0.04 10*3/uL (ref 0.00–0.07)
BASOS ABS: 0.1 10*3/uL (ref 0.0–0.1)
Basophils Relative: 0 %
EOS ABS: 0.1 10*3/uL (ref 0.0–0.5)
EOS PCT: 1 %
HCT: 43.5 % (ref 36.0–46.0)
Hemoglobin: 14.4 g/dL (ref 12.0–15.0)
Immature Granulocytes: 0 %
Lymphocytes Relative: 18 %
Lymphs Abs: 2.4 10*3/uL (ref 0.7–4.0)
MCH: 31.1 pg (ref 26.0–34.0)
MCHC: 33.1 g/dL (ref 30.0–36.0)
MCV: 94 fL (ref 80.0–100.0)
Monocytes Absolute: 0.8 10*3/uL (ref 0.1–1.0)
Monocytes Relative: 6 %
NEUTROS PCT: 75 %
NRBC: 0 % (ref 0.0–0.2)
Neutro Abs: 10 10*3/uL — ABNORMAL HIGH (ref 1.7–7.7)
PLATELETS: 291 10*3/uL (ref 150–400)
RBC: 4.63 MIL/uL (ref 3.87–5.11)
RDW: 12.1 % (ref 11.5–15.5)
WBC: 13.3 10*3/uL — AB (ref 4.0–10.5)

## 2018-02-26 LAB — URINALYSIS, ROUTINE W REFLEX MICROSCOPIC
BACTERIA UA: NONE SEEN
BILIRUBIN URINE: NEGATIVE
Glucose, UA: NEGATIVE mg/dL
Ketones, ur: NEGATIVE mg/dL
NITRITE: NEGATIVE
Protein, ur: NEGATIVE mg/dL
SPECIFIC GRAVITY, URINE: 1.026 (ref 1.005–1.030)
pH: 7 (ref 5.0–8.0)

## 2018-02-26 LAB — LIPASE, BLOOD: Lipase: 29 U/L (ref 11–51)

## 2018-02-26 LAB — I-STAT BETA HCG BLOOD, ED (MC, WL, AP ONLY)

## 2018-02-26 MED ORDER — SCOPOLAMINE 1 MG/3DAYS TD PT72
1.0000 | MEDICATED_PATCH | TRANSDERMAL | Status: DC
  Administered 2018-02-26: 1.5 mg via TRANSDERMAL
  Filled 2018-02-26: qty 1

## 2018-02-26 MED ORDER — FAMOTIDINE IN NACL 20-0.9 MG/50ML-% IV SOLN
20.0000 mg | Freq: Once | INTRAVENOUS | Status: AC
  Administered 2018-02-26: 20 mg via INTRAVENOUS
  Filled 2018-02-26: qty 50

## 2018-02-26 MED ORDER — SUCRALFATE 1 G PO TABS: 1.0000 g | ORAL_TABLET | Freq: Three times a day (TID) | ORAL | 0 refills | Status: DC

## 2018-02-26 MED ORDER — FAMOTIDINE 20 MG PO TABS: 20.0000 mg | ORAL_TABLET | Freq: Two times a day (BID) | ORAL | 0 refills | Status: DC

## 2018-02-26 MED ORDER — ONDANSETRON 4 MG PO TBDP: ORAL_TABLET | ORAL | 0 refills | Status: DC

## 2018-02-26 MED ORDER — SODIUM CHLORIDE 0.9 % IV BOLUS
2000.0000 mL | Freq: Once | INTRAVENOUS | Status: AC
  Administered 2018-02-26: 2000 mL via INTRAVENOUS

## 2018-02-26 MED ORDER — ONDANSETRON HCL 4 MG/2ML IJ SOLN
4.0000 mg | Freq: Once | INTRAMUSCULAR | Status: AC
  Administered 2018-02-26: 4 mg via INTRAVENOUS
  Filled 2018-02-26: qty 2

## 2018-02-26 MED ORDER — HALOPERIDOL LACTATE 5 MG/ML IJ SOLN
5.0000 mg | Freq: Once | INTRAMUSCULAR | Status: AC
  Administered 2018-02-26: 5 mg via INTRAVENOUS
  Filled 2018-02-26: qty 1

## 2018-02-26 NOTE — ED Triage Notes (Signed)
Pt is here for nausea and vomiting with epigastric abdominal pain.  Pt with hx of marijuana use and hyperemesis related to this.

## 2018-02-26 NOTE — ED Provider Notes (Signed)
[facility] EMERGENCY DEPARTMENT Provider Note   CSN: [id number] Arrival date & time: 02/26/18  1016     History   Chief Complaint Chief Complaint  Patient presents with  . Abdominal Pain    HPI Brittney Khan is a 32 y.o. female.  Brittney Khan is a 32 y.o. Female with a history of asthma, hip fracture and cannabis hyperemesis, who presents for evaluation of epigastric abdominal pain, nausea and vomiting that started suddenly this morning.  Patient reports she woke up from sleeping and felt like she needed to have a bowel movement, had a normal bowel movement and then her stomach started to hurt and since then she has had 2 episodes of vomiting and felt very nauseated.  She has been seen in the emergency department with similar symptoms numerous times.  She denies any blood in the vomit.  No blood in her stools or melena.  Denies fevers or chills.  No dysuria or urinary frequency.  No vaginal discharge, patient is currently on her menstrual cycle which she reports has been regular.  Reports she took Motrin without improvement think she vomited this up.  Reports marijuana use 2 days ago, denies alcohol or other drug use.  Also reports history of gastritis.  Family member at bedside reports his symptoms which seem to occur with patient's menstrual cycle.  She has not been seen by any specialist or followed up with her primary care doctor regarding this, she gets the majority of her care at the [place].     Past Medical History:  Diagnosis Date  . Asthma   . Hip fracture, right [facility]) 2010    Patient Active Problem List   Diagnosis Date Noted  . Left knee pain 08/06/2015  . Nausea vomiting and diarrhea 12/23/2012  . Infectious colitis 12/23/2012    History reviewed. No pertinent surgical history.   OB History   None      Home Medications    Prior to Admission medications   Medication Sig Start Date End Date Taking? Authorizing Provider  albuterol  (PROVENTIL HFA;VENTOLIN HFA) 108 (90 BASE) MCG/ACT inhaler Inhale 1-2 puffs into the lungs every 6 (six) hours as needed for wheezing or shortness of breath.    [provider]  albuterol (PROVENTIL) (2.5 MG/3ML) 0.083% nebulizer solution Take 3 mLs (2.5 mg total) by nebulization every 6 (six) hours as needed for wheezing or shortness of breath. 11/30/13   Mellody Drown, PA-C  alum & mag hydroxide-simeth (MAALOX ADVANCED MAX ST) 400-400-40 MG/5ML suspension Take 10 mLs by mouth every 6 (six) hours as needed for indigestion. Patient not taking: Reported on 03/11/2017 12/19/16   Liberty Handy, PA-C  benzonatate (TESSALON) 100 MG capsule Take 1 capsule (100 mg total) by mouth every 8 (eight) hours. 04/19/17   Rolland Porter, MD  famotidine (PEPCID) 20 MG tablet Take 1 tablet (20 mg total) by mouth 2 (two) times daily. 02/26/18   Dartha Lodge, PA-C  metoCLOPramide (REGLAN) 10 MG tablet Take 1 tablet (10 mg total) by mouth every 6 (six) hours as needed for nausea. 03/11/17   Doug Sou, MD  omeprazole (PRILOSEC) 20 MG capsule Take one capsule daily at least 30 minutes before first dose of Carafate. Patient not taking: Reported on 03/11/2017 12/20/16   Molpus, Jonny Ruiz, MD  ondansetron (ZOFRAN ODT) 4 MG disintegrating tablet 4mg  ODT q4 hours prn nausea/vomit 02/26/18   Dartha Lodge, PA-C  promethazine (PHENERGAN) 25 MG suppository Place 1 suppository (25 mg  total) rectally every 6 (six) hours as needed for nausea or vomiting. 03/11/17   Doug Sou, MD  sucralfate (CARAFATE) 1 g tablet Take 1 tablet (1 g total) by mouth 4 (four) times daily -  with meals and at bedtime. 02/26/18   Dartha Lodge, PA-C    Family History No family history on file.  Social History Social History   Tobacco Use  . Smoking status: Former Smoker    Last attempt to quit: 04/05/2010    Years since quitting: 7.9  . Smokeless tobacco: Never Used  Substance Use Topics  . Alcohol use: Yes    Alcohol/week: 0.0  standard drinks    Comment: socially  . Drug use: Yes    Types: Marijuana    Comment: x 1 month drug free      Allergies   Reglan [metoclopramide]   Review of Systems Review of Systems  Constitutional: Negative for chills and fever.  HENT: Negative.   Eyes: Negative for visual disturbance.  Respiratory: Negative for cough and shortness of breath.   Cardiovascular: Negative for chest pain.  Gastrointestinal: Positive for abdominal pain, nausea and vomiting. Negative for blood in stool, constipation and diarrhea.  Genitourinary: Negative for dysuria, flank pain, frequency and hematuria.  Musculoskeletal: Negative for arthralgias and myalgias.  Skin: Negative for color change and rash.  Neurological: Negative for dizziness, syncope and light-headedness.     Physical Exam Updated Vital Signs BP (!) 135/118   Pulse 70   Temp 97.8 F (36.6 C) (Oral)   Resp 16   Ht 5\' 5"  (1.651 m)   Wt 77.1 kg   LMP 02/25/2018   SpO2 100%   BMI 28.29 kg/m   Physical Exam  Constitutional: She appears well-developed and well-nourished. She does not appear ill.  Patient appears uncomfortable but is in no acute distress  HENT:  Head: Normocephalic and atraumatic.  Mouth/Throat: Oropharynx is clear and moist.  Eyes: Right eye exhibits no discharge. Left eye exhibits no discharge.  Cardiovascular: Normal rate, regular rhythm, normal heart sounds and intact distal pulses. Exam reveals no gallop and no friction rub.  No murmur heard. Pulmonary/Chest: Effort normal and breath sounds normal. No respiratory distress.  Respirations equal and unlabored, patient able to speak in full sentences, lungs clear to auscultation bilaterally  Abdominal: Soft. Normal appearance. She exhibits no distension. There is tenderness in the epigastric area. There is no rigidity, no rebound, no guarding and no CVA tenderness.  Abdomen is soft, nondistended, bowel sounds present throughout, there is some tenderness in  the epigastric region without any guarding or rigidity, all other quadrants nontender to palpation, no peritoneal signs, no CVA tenderness bilaterally  Neurological: She is alert. Coordination normal.  Skin: She is not diaphoretic.  Psychiatric: She has a normal mood and affect. Her behavior is normal.  Nursing note and vitals reviewed.    ED Treatments / Results  Labs (all labs ordered are listed, but only abnormal results are displayed) Labs Reviewed  CBC WITH DIFFERENTIAL/PLATELET - Abnormal; Notable for the following components:      Result Value   WBC 13.3 (*)    Neutro Abs 10.0 (*)    All other components within normal limits  COMPREHENSIVE METABOLIC PANEL - Abnormal; Notable for the following components:   Glucose, Bld 120 (*)    All other components within normal limits  URINALYSIS, ROUTINE W REFLEX MICROSCOPIC - Abnormal; Notable for the following components:   APPearance HAZY (*)    Hgb  urine dipstick SMALL (*)    Leukocytes, UA TRACE (*)    All other components within normal limits  LIPASE, BLOOD  I-STAT BETA HCG BLOOD, ED ([facility], [facility], [facility] ONLY)    EKG EKG Interpretation  Date/Time:  Sunday February 26 2018 12:13:37 EST Ventricular Rate:  56 PR Interval:  122 QRS Duration: 82 QT Interval:  440 QTC Calculation: 424 R Axis:   80 Text Interpretation: Poor data quality, interpretation may be adversely affected Sinus bradycardia Possible Left atrial enlargement Nonspecific T wave abnormality Abnormal ECG No significant change since last tracing Confirmed by Raeford RazorKohut, Stephen [phone]([id number]) on 02/26/2018 12:50:45 PM   Radiology No results found.  Procedures Procedures (including critical care time)  Medications Ordered in ED Medications  scopolamine (TRANSDERM-SCOP) 1 MG/3DAYS 1.5 mg (1.5 mg Transdermal Patch Applied 02/26/18 1117)  sodium chloride 0.9 % bolus 2,000 mL (0 mLs Intravenous Stopped 02/26/18 1426)  ondansetron (ZOFRAN) injection 4 mg (4 mg Intravenous Given  02/26/18 1105)  haloperidol lactate (HALDOL) injection 5 mg (5 mg Intravenous Given 02/26/18 1116)  famotidine (PEPCID) IVPB 20 mg premix (0 mg Intravenous Stopped 02/26/18 1136)     Initial Impression / Assessment and Plan / ED Course  I have reviewed the triage vital signs and the nursing notes.  Pertinent labs & imaging results that were available during my care of the patient were reviewed by me and considered in my medical decision making (see chart for details).  Patient presents with sudden onset epigastric abdominal pain, nausea and vomiting which started this morning.  Patient reports history of the same in chart review reveals she has been seen multiple times with similar presentations concerning for septic vomiting syndrome.  Patient reports the seem to occur when she is on her menstrual cycle, but she also reports frequent marijuana use which could be contributing.  On arrival patient with normal vitals.  She has some mild tenderness in the epigastrium without guarding and the abdomen is otherwise nontender to palpation.  Will get abdominal labs and give 2 L fluid bolus and cocktail of antiemetics to attempt to control vomiting.  Given reassuring abdominal exam do not think that imaging is indicated, no peritoneal signs to suggest surgical abdomen.  Evaluation today is very reassuring, mild leukocytosis of 13.3, and on all other times the patient has been seen with similar symptoms she has had a leukocytosis which I think is likely a reaction to multiple episodes of vomiting.  Normal hemoglobin, no acute electrolyte derangements requiring intervention and normal renal and liver function, normal lipase.  Negative pregnancy.  Urinalysis without signs of infection.  After fluids and medications patient reports significant improvement in her pain and she has had no further vomiting she is tolerating p.o. fluids without difficulty.  At this time I feel patient is stable for discharge home, she  is aware that scopolamine patch can remain on for 3 days and continue to help control nausea, will also prescribe Carafate and Pepcid for gastritis and epigastric discomfort, and Zofran for breakthrough nausea.  Encourage patient to follow-up with primary care doctor, OB/GYN and gastroenterology for further evaluation of cyclic vomiting which could be due to menstrual cycle versus cannabis use.  Return precautions discussed.  Patient expresses understanding and is in agreement with plan.  Final Clinical Impressions(s) / ED Diagnoses   Final diagnoses:  Epigastric pain  Non-intractable vomiting with nausea, unspecified vomiting type    ED Discharge Orders         Ordered  sucralfate (CARAFATE) 1 g tablet  3 times daily with meals & bedtime     02/26/18 1401    ondansetron (ZOFRAN ODT) 4 MG disintegrating tablet     02/26/18 1401    famotidine (PEPCID) 20 MG tablet  2 times daily     02/26/18 1401           Dartha Lodge, [place] 02/26/18 1451    Vanetta Mulders, MD 02/27/18 [phone]

## 2018-02-26 NOTE — ED Notes (Signed)
Patient given discharge instructions and verbalized understanding.  Patient stable to discharge at this time.  Patient is alert and oriented to baseline.  No distressed noted at this time.  All belongings taken with the patient at discharge.   

## 2018-02-26 NOTE — Discharge Instructions (Signed)
Your lab work and evaluation today is overall reassuring.  You can get cyclic vomiting and abdominal pain related to your menstrual cycle as well as to marijuana use I would like free to follow-up with your primary care doctor as well as a GI doctor regarding this for further evaluation through the [facility].  In the meantime you can use the nausea medication as needed for nausea and vomiting, and Carafate and Pepcid to help with epigastric pain and discomfort.  Return to the emergency department if you have worsening abdominal pain or abdominal pain in any location, persistent vomiting and you are unable to stop despite these medications, fevers or any other new or concerning symptoms.

## 2018-09-16 ENCOUNTER — Emergency Department (HOSPITAL_COMMUNITY)
Admission: EM | Admit: 2018-09-16 | Discharge: 2018-09-16 | Disposition: A | Discharge status: Home / Self Care | Payer: Self-pay | Attending: Emergency Medicine | Admitting: Emergency Medicine

## 2018-09-16 ENCOUNTER — Encounter (HOSPITAL_COMMUNITY): Payer: Self-pay | Admitting: Emergency Medicine

## 2018-09-16 DIAGNOSIS — R61 Generalized hyperhidrosis: Secondary | ICD-10-CM | POA: Insufficient documentation

## 2018-09-16 DIAGNOSIS — Z79899 Other long term (current) drug therapy: Secondary | ICD-10-CM | POA: Insufficient documentation

## 2018-09-16 DIAGNOSIS — F1290 Cannabis use, unspecified, uncomplicated: Secondary | ICD-10-CM | POA: Insufficient documentation

## 2018-09-16 DIAGNOSIS — R1084 Generalized abdominal pain: Secondary | ICD-10-CM | POA: Insufficient documentation

## 2018-09-16 DIAGNOSIS — Z87891 Personal history of nicotine dependence: Secondary | ICD-10-CM | POA: Insufficient documentation

## 2018-09-16 DIAGNOSIS — R112 Nausea with vomiting, unspecified: Secondary | ICD-10-CM | POA: Insufficient documentation

## 2018-09-16 LAB — CBC
HCT: 41.6 % (ref 36.0–46.0)
Hemoglobin: 14 g/dL (ref 12.0–15.0)
MCH: 31.6 pg (ref 26.0–34.0)
MCHC: 33.7 g/dL (ref 30.0–36.0)
MCV: 93.9 fL (ref 80.0–100.0)
Platelets: 323 10*3/uL (ref 150–400)
RBC: 4.43 MIL/uL (ref 3.87–5.11)
RDW: 11.9 % (ref 11.5–15.5)
WBC: 14.8 10*3/uL — ABNORMAL HIGH (ref 4.0–10.5)
nRBC: 0 % (ref 0.0–0.2)

## 2018-09-16 LAB — COMPREHENSIVE METABOLIC PANEL
ALT: 14 U/L (ref 0–44)
AST: 18 U/L (ref 15–41)
Albumin: 4.2 g/dL (ref 3.5–5.0)
Alkaline Phosphatase: 84 U/L (ref 38–126)
Anion gap: 12 (ref 5–15)
BUN: 7 mg/dL (ref 6–20)
CO2: 23 mmol/L (ref 22–32)
Calcium: 9.6 mg/dL (ref 8.9–10.3)
Chloride: 104 mmol/L (ref 98–111)
Creatinine, Ser: 1.1 mg/dL — ABNORMAL HIGH (ref 0.44–1.00)
GFR calc Af Amer: >60 mL/min (ref >60)
GFR calc non Af Amer: >60 mL/min (ref >60)
Glucose, Bld: 139 mg/dL — ABNORMAL HIGH (ref 70–99)
Potassium: 3.5 mmol/L (ref 3.5–5.1)
Sodium: 139 mmol/L (ref 135–145)
Total Bilirubin: 0.5 mg/dL (ref 0.3–1.2)
Total Protein: 7.3 g/dL (ref 6.5–8.1)

## 2018-09-16 LAB — LIPASE, BLOOD: Lipase: 23 U/L (ref 11–51)

## 2018-09-16 LAB — URINALYSIS, ROUTINE W REFLEX MICROSCOPIC
Bilirubin Urine: NEGATIVE
Glucose, UA: NEGATIVE mg/dL
Hgb urine dipstick: NEGATIVE
Ketones, ur: NEGATIVE mg/dL
Nitrite: NEGATIVE
Protein, ur: 100 mg/dL — AB
Specific Gravity, Urine: 1.026 (ref 1.005–1.030)
pH: 8 (ref 5.0–8.0)

## 2018-09-16 LAB — I-STAT BETA HCG BLOOD, ED (MC, WL, AP ONLY): I-stat hCG, quantitative: <5 m[IU]/mL (ref <5)

## 2018-09-16 MED ORDER — PROMETHAZINE HCL 25 MG/ML IJ SOLN
25.0000 mg | Freq: Once | INTRAMUSCULAR | Status: AC
  Administered 2018-09-16: 25 mg via INTRAVENOUS
  Filled 2018-09-16: qty 1

## 2018-09-16 MED ORDER — HALOPERIDOL LACTATE 5 MG/ML IJ SOLN
2.0000 mg | Freq: Once | INTRAMUSCULAR | Status: AC
  Administered 2018-09-16: 2 mg via INTRAVENOUS
  Filled 2018-09-16: qty 1

## 2018-09-16 MED ORDER — LORAZEPAM 2 MG/ML IJ SOLN
0.5000 mg | Freq: Once | INTRAMUSCULAR | Status: AC
  Administered 2018-09-16: 0.5 mg via INTRAVENOUS
  Filled 2018-09-16: qty 1

## 2018-09-16 MED ORDER — SODIUM CHLORIDE 0.9 % IV BOLUS
1000.0000 mL | Freq: Once | INTRAVENOUS | Status: AC
  Administered 2018-09-16: 1000 mL via INTRAVENOUS

## 2018-09-16 MED ORDER — ONDANSETRON 8 MG PO TBDP: 8.0000 mg | ORAL_TABLET | Freq: Three times a day (TID) | ORAL | 0 refills | Status: DC | PRN

## 2018-09-16 NOTE — Discharge Instructions (Addendum)
Please use Zofran as needed for nausea. Drink clear liquids for the next 12 hours then gradually advance your diet Return to the emergency department if you are unable to tolerate liquids or have worsening pain

## 2018-09-16 NOTE — ED Provider Notes (Signed)
[facility] EMERGENCY DEPARTMENT Provider Note   CSN: [id number] Arrival date & time: 09/16/18  1003     History   Chief Complaint Chief Complaint  Patient presents with  . Nausea  . Emesis    HPI Brittney Khan is a 33 y.o. female.     HPI 33 year old female history of asthma, daily cannabis use, presents today complaining of nausea vomiting and abdominal pain that began this morning.  She has had some diaphoresis but no fever.  She has vomited up clear to green fluid.  She has had some similar episodes in the past mostly associated with her menstrual cycle but today is not menstruating.  She reports that she cannot remove the date of her last menstrual cycle but that it was normal and she does not think that she is pregnant.  She denies any headache, neck pain, chest pain, dyspnea, abnormal vaginal discharge, STD, urinary frequency or pain with urination. Past Medical History:  Diagnosis Date  . Asthma   . Hip fracture, right [facility]) 2010    Patient Active Problem List   Diagnosis Date Noted  . Left knee pain 08/06/2015  . Nausea vomiting and diarrhea 12/23/2012  . Infectious colitis 12/23/2012    History reviewed. No pertinent surgical history.   OB History   No obstetric history on file.      Home Medications    Prior to Admission medications   Medication Sig Start Date End Date Taking? Authorizing Provider  albuterol (PROVENTIL HFA;VENTOLIN HFA) 108 (90 BASE) MCG/ACT inhaler Inhale 1-2 puffs into the lungs every 6 (six) hours as needed for wheezing or shortness of breath.    [provider]  albuterol (PROVENTIL) (2.5 MG/3ML) 0.083% nebulizer solution Take 3 mLs (2.5 mg total) by nebulization every 6 (six) hours as needed for wheezing or shortness of breath. 11/30/13   Harvie Heck, PA-C  alum & mag hydroxide-simeth (MAALOX ADVANCED MAX ST) 400-400-40 MG/5ML suspension Take 10 mLs by mouth every 6 (six) hours as needed for  indigestion. Patient not taking: Reported on 03/11/2017 12/19/16   Kinnie Feil, PA-C  benzonatate (TESSALON) 100 MG capsule Take 1 capsule (100 mg total) by mouth every 8 (eight) hours. 04/19/17   Tanna Furry, MD  famotidine (PEPCID) 20 MG tablet Take 1 tablet (20 mg total) by mouth 2 (two) times daily. 02/26/18   Jacqlyn Larsen, PA-C  metoCLOPramide (REGLAN) 10 MG tablet Take 1 tablet (10 mg total) by mouth every 6 (six) hours as needed for nausea. 03/11/17   Orlie Dakin, MD  omeprazole (PRILOSEC) 20 MG capsule Take one capsule daily at least 30 minutes before first dose of Carafate. Patient not taking: Reported on 03/11/2017 12/20/16   Molpus, Jenny Reichmann, MD  ondansetron (ZOFRAN ODT) 4 MG disintegrating tablet 4mg  ODT q4 hours prn nausea/vomit 02/26/18   Jacqlyn Larsen, PA-C  promethazine (PHENERGAN) 25 MG suppository Place 1 suppository (25 mg total) rectally every 6 (six) hours as needed for nausea or vomiting. 03/11/17   Orlie Dakin, MD  sucralfate (CARAFATE) 1 g tablet Take 1 tablet (1 g total) by mouth 4 (four) times daily -  with meals and at bedtime. 02/26/18   Jacqlyn Larsen, PA-C    Family History History reviewed. No pertinent family history.  Social History Social History   Tobacco Use  . Smoking status: Former Smoker    Quit date: 04/05/2010    Years since quitting: 8.4  . Smokeless tobacco: Never Used  Substance  Use Topics  . Alcohol use: Yes    Alcohol/week: 0.0 standard drinks    Comment: socially  . Drug use: Yes    Frequency: 1.0 times per week    Types: Marijuana     Allergies   Reglan [metoclopramide]   Review of Systems Review of Systems  All other systems reviewed and are negative.    Physical Exam Updated Vital Signs BP (!) 152/110   Pulse (!) 54   Resp 18   SpO2 100%   Physical Exam Vitals signs and nursing note reviewed.  Constitutional:      Appearance: She is ill-appearing.  HENT:     Head: Normocephalic.     Right Ear: External ear  normal.     Left Ear: External ear normal.     Nose: Nose normal.     Mouth/Throat:     Mouth: Mucous membranes are moist.  Neck:     Musculoskeletal: Normal range of motion.  Cardiovascular:     Pulses: Normal pulses.  Pulmonary:     Effort: Pulmonary effort is normal.     Breath sounds: Normal breath sounds.  Abdominal:     General: Abdomen is flat. Bowel sounds are normal.     Palpations: Abdomen is soft. There is no mass.     Tenderness: There is no abdominal tenderness.     Hernia: No hernia is present.     Comments: Patient with some gagging and vomiting on exam  Musculoskeletal: Normal range of motion.  Skin:    Capillary Refill: Capillary refill takes less than 2 seconds.     Comments: diaphoretic  Neurological:     General: No focal deficit present.     Mental Status: She is alert and oriented to person, place, and time.  Psychiatric:        Mood and Affect: Mood normal.      ED Treatments / Results  Labs (all labs ordered are listed, but only abnormal results are displayed) Labs Reviewed  CBC  COMPREHENSIVE METABOLIC PANEL  LIPASE, BLOOD  URINALYSIS, ROUTINE W REFLEX MICROSCOPIC  I-STAT BETA HCG BLOOD, ED ([facility], [facility], [facility] ONLY)    EKG    Radiology No results found.  Procedures Procedures (including critical care time)  Medications Ordered in ED Medications  sodium chloride 0.9 % bolus 1,000 mL (has no administration in time range)  promethazine (PHENERGAN) injection 25 mg (has no administration in time range)     Initial Impression / Assessment and Plan / ED Course  I have reviewed the triage vital signs and the nursing notes.  Pertinent labs & imaging results that were available during my care of the patient were reviewed by me and considered in my medical decision making (see chart for details).    [age] year old female presents today with nausea and vomiting.  Nausea and vomiting resolved after medications.  Patient is sleepy and will medication  for Effexor off.  She is given oral fluid challenge at this time.   Patient awake and appears stable for discharge  Final Clinical Impressions(s) / ED Diagnoses   Final diagnoses:  Nausea and vomiting, intractability of vomiting not specified, unspecified vomiting type    ED Discharge Orders    None       Margarita Grizzleay, Erie Sica, MD 09/16/18 1341

## 2018-09-16 NOTE — ED Triage Notes (Signed)
Pt here from home with c/o n/v and abd pain , started at [facility]

## 2018-09-16 NOTE — ED Notes (Signed)
Pt given ginger ale for po/fluid challenge.

## 2018-09-16 NOTE — ED Notes (Addendum)
Patient brought over to room 52 to finish sleeping off the haldol before she can be released.  Patient remains asleep.   Discharge paperwork at bedside.

## 2019-03-21 ENCOUNTER — Telehealth: Payer: Self-pay | Admitting: General Practice

## 2019-03-21 DIAGNOSIS — O3680X0 Pregnancy with inconclusive fetal viability, not applicable or unspecified: Secondary | ICD-10-CM

## 2019-03-21 NOTE — Telephone Encounter (Signed)
Lauren from the [facility] called and left message on nurse voicemail line stating she is calling for a referral to [place] for the patient. She states she saw the patient on 12/2 for U/S in which the patient would've been 7.6 by LMP. She saw a fetal pole at that time but no cardiac activity. She states the patient returned today for a repeat ultrasound and she couldn't see any further development compared to the previous U/S and still no cardiac activity.   Scheduled 12/28 @ 8am. Called patient & informed her of appt, address, & instructions. Advised if she experiences pain or bleeding between now & then to go to MAU for evaluation. Patient verbalized understanding & had no questions.

## 2019-03-23 ENCOUNTER — Encounter (HOSPITAL_COMMUNITY): Payer: Self-pay | Admitting: *Deleted

## 2019-03-23 ENCOUNTER — Inpatient Hospital Stay (HOSPITAL_COMMUNITY)
Admission: EM | Admit: 2019-03-23 | Discharge: 2019-03-24 | Disposition: A | Discharge status: Home / Self Care | Payer: No Typology Code available for payment source | Attending: Obstetrics & Gynecology | Admitting: Obstetrics & Gynecology

## 2019-03-23 ENCOUNTER — Inpatient Hospital Stay (HOSPITAL_COMMUNITY): Payer: No Typology Code available for payment source

## 2019-03-23 DIAGNOSIS — O209 Hemorrhage in early pregnancy, unspecified: Secondary | ICD-10-CM | POA: Diagnosis not present

## 2019-03-23 DIAGNOSIS — Z3A10 10 weeks gestation of pregnancy: Secondary | ICD-10-CM | POA: Diagnosis not present

## 2019-03-23 DIAGNOSIS — O3680X0 Pregnancy with inconclusive fetal viability, not applicable or unspecified: Secondary | ICD-10-CM | POA: Diagnosis not present

## 2019-03-23 DIAGNOSIS — Z87891 Personal history of nicotine dependence: Secondary | ICD-10-CM | POA: Diagnosis not present

## 2019-03-23 DIAGNOSIS — O26891 Other specified pregnancy related conditions, first trimester: Secondary | ICD-10-CM | POA: Insufficient documentation

## 2019-03-23 DIAGNOSIS — O200 Threatened abortion: Secondary | ICD-10-CM | POA: Diagnosis present

## 2019-03-23 DIAGNOSIS — Z888 Allergy status to other drugs, medicaments and biological substances status: Secondary | ICD-10-CM | POA: Diagnosis not present

## 2019-03-23 DIAGNOSIS — O219 Vomiting of pregnancy, unspecified: Secondary | ICD-10-CM | POA: Insufficient documentation

## 2019-03-23 DIAGNOSIS — R102 Pelvic and perineal pain: Secondary | ICD-10-CM | POA: Insufficient documentation

## 2019-03-23 DIAGNOSIS — N939 Abnormal uterine and vaginal bleeding, unspecified: Secondary | ICD-10-CM | POA: Diagnosis present

## 2019-03-23 LAB — CBC
HCT: 36.3 % (ref 36.0–46.0)
Hemoglobin: 12.4 g/dL (ref 12.0–15.0)
MCH: 31.8 pg (ref 26.0–34.0)
MCHC: 34.2 g/dL (ref 30.0–36.0)
MCV: 93.1 fL (ref 80.0–100.0)
Platelets: 317 10*3/uL (ref 150–400)
RBC: 3.9 MIL/uL (ref 3.87–5.11)
RDW: 12.1 % (ref 11.5–15.5)
WBC: 18.7 10*3/uL — ABNORMAL HIGH (ref 4.0–10.5)
nRBC: 0 % (ref 0.0–0.2)

## 2019-03-23 LAB — URINALYSIS, ROUTINE W REFLEX MICROSCOPIC
Bacteria, UA: NONE SEEN
Bilirubin Urine: NEGATIVE
Glucose, UA: NEGATIVE mg/dL
Ketones, ur: 80 mg/dL — AB
Nitrite: NEGATIVE
Protein, ur: 100 mg/dL — AB
RBC / HPF: >50 RBC/hpf — ABNORMAL HIGH (ref 0–5)
Specific Gravity, Urine: 1.033 — ABNORMAL HIGH (ref 1.005–1.030)
WBC, UA: >50 WBC/hpf — ABNORMAL HIGH (ref 0–5)
pH: 5 (ref 5.0–8.0)

## 2019-03-23 LAB — POCT PREGNANCY, URINE: Preg Test, Ur: POSITIVE — AB

## 2019-03-23 MED ORDER — HYDROMORPHONE HCL 1 MG/ML IJ SOLN
1.0000 mg | Freq: Once | INTRAMUSCULAR | Status: AC
  Administered 2019-03-23: 1 mg via INTRAMUSCULAR
  Filled 2019-03-23: qty 1

## 2019-03-23 MED ORDER — PROMETHAZINE HCL 25 MG/ML IJ SOLN
25.0000 mg | Freq: Once | INTRAMUSCULAR | Status: AC
  Administered 2019-03-23: 25 mg via INTRAMUSCULAR
  Filled 2019-03-23: qty 1

## 2019-03-23 MED ORDER — M.V.I. ADULT IV INJ
Freq: Once | INTRAVENOUS | Status: AC
  Filled 2019-03-23: qty 1000

## 2019-03-23 NOTE — MAU Note (Signed)
Pt reports vaginal bleeding since this morning. Stated it got heavy around 1pm. Having nausea and vomiting with it  And has "passed out"  Blleeding passing medium sized clots

## 2019-03-23 NOTE — ED Notes (Signed)
Report called to MAU  

## 2019-03-23 NOTE — MAU Provider Note (Addendum)
History     CSN: [id number]  Arrival date and time: 03/23/19 2109   First Provider Initiated Contact with Patient 03/23/19 2306      Chief Complaint  Patient presents with  . Emesis  . Abdominal Pain  . Vaginal Bleeding   HPI  Ms.  Brittney Khan is a 33 y.o. year old G1P0 female at [redacted]w[redacted]d weeks gestation by LMP who was sent to MAU from the [facility] reporting (+) HPT, vaginal bleeding, abdominal pain, and N/V. She reports she knew was pregnant before coming to the hospital today. She started having vaginal bleeding this morning about 1100, but it became heavier about 1300 this afternoon. She has had N/V today and "passed ot" earlier. She has also been passing medium sized blood clots.   Past Medical History:  Diagnosis Date  . Asthma   . Hip fracture, right ([place]) 2010    History reviewed. No pertinent surgical history.  History reviewed. No pertinent family history.  Social History   Tobacco Use  . Smoking status: Former Smoker    Quit date: 04/05/2010    Years since quitting: 8.9  . Smokeless tobacco: Never Used  Substance Use Topics  . Alcohol use: Yes    Alcohol/week: 0.0 standard drinks    Comment: socially  . Drug use: Yes    Frequency: 1.0 times per week    Types: Marijuana    Allergies:  Allergies  Allergen Reactions  . Reglan [Metoclopramide] Anxiety    Medications Prior to Admission  Medication Sig Dispense Refill Last Dose  . albuterol (PROVENTIL HFA;VENTOLIN HFA) 108 (90 BASE) MCG/ACT inhaler Inhale 1-2 puffs into the lungs every 6 (six) hours as needed for wheezing or shortness of breath.     Marland Kitchen albuterol (PROVENTIL) (2.5 MG/3ML) 0.083% nebulizer solution Take 3 mLs (2.5 mg total) by nebulization every 6 (six) hours as needed for wheezing or shortness of breath. 75 mL 0   . alum & mag hydroxide-simeth (MAALOX ADVANCED MAX ST) 400-400-40 MG/5ML suspension Take 10 mLs by mouth every 6 (six) hours as needed for indigestion. (Patient not taking: Reported on  03/11/2017) 355 mL 0   . benzonatate (TESSALON) 100 MG capsule Take 1 capsule (100 mg total) by mouth every 8 (eight) hours. 21 capsule 0   . famotidine (PEPCID) 20 MG tablet Take 1 tablet (20 mg total) by mouth 2 (two) times daily. 30 tablet 0   . metoCLOPramide (REGLAN) 10 MG tablet Take 1 tablet (10 mg total) by mouth every 6 (six) hours as needed for nausea. 10 tablet 0   . omeprazole (PRILOSEC) 20 MG capsule Take one capsule daily at least 30 minutes before first dose of Carafate. (Patient not taking: Reported on 03/11/2017) 30 capsule 0   . ondansetron (ZOFRAN ODT) 8 MG disintegrating tablet Take 1 tablet (8 mg total) by mouth every 8 (eight) hours as needed for nausea or vomiting. 20 tablet 0   . promethazine (PHENERGAN) 25 MG suppository Place 1 suppository (25 mg total) rectally every 6 (six) hours as needed for nausea or vomiting. 12 each 0   . sucralfate (CARAFATE) 1 g tablet Take 1 tablet (1 g total) by mouth 4 (four) times daily -  with meals and at bedtime. 40 tablet 0     Review of Systems  Constitutional: Negative.   HENT: Negative.   Eyes: Negative.   Respiratory: Negative.   Cardiovascular: Negative.   Gastrointestinal: Positive for nausea and vomiting.  Endocrine: Negative.   Genitourinary: Positive  for pelvic pain and vaginal bleeding (heavy with clots).  Musculoskeletal: Negative.   Skin: Negative.   Allergic/Immunologic: Negative.   Neurological: Negative.   Hematological: Negative.   Psychiatric/Behavioral: Negative.    Physical Exam   Blood pressure 120/61, pulse 65, temperature 98.2 F (36.8 C), temperature source Oral, resp. rate 16, last menstrual period 01/11/2019, SpO2 100 %.  Physical Exam  Nursing note and vitals reviewed. Constitutional: She is oriented to person, place, and time. She appears well-developed and well-nourished.  HENT:  Head: Normocephalic and atraumatic.  Eyes: Pupils are equal, round, and reactive to light.  Cardiovascular: Normal  rate.  Respiratory: Effort normal.  GI: Soft.  Genitourinary:    Genitourinary Comments: Uterus: non-tender, SE: cervix is smooth, pink, no lesions, small amt of dark, red blood in vagina and in cervical os -- WP, GC/CT done, closed/long/firm, no CMT or friability, no adnexal tenderness    Musculoskeletal:        General: Normal range of motion.     Cervical back: Normal range of motion.  Neurological: She is alert and oriented to person, place, and time.  Skin: Skin is warm and dry.  Psychiatric: She has a normal mood and affect. Her behavior is normal. Judgment and thought content normal.    MAU Course  Procedures  MDM [facility] UPT CBC ABO/Rh HCG Wet Prep GC/CT -- pending HIV -- pending OB < 14 wks [place] with TV Dilaudid 1 mg IM injection -- pain resolved, but patient complained of upper abdominal pain prior to discharge  no additional pain medication ordered prior to discharge home.  Phenergan 25 mg IM injection -- N/V resolved Pepcid 20 mg IVPB for upper abdominal pain that is probably GERD  Results for orders placed or performed during the hospital encounter of 03/23/19 (from the past 24 hour(s))  Urinalysis, Routine w reflex microscopic     Status: Abnormal   Collection Time: 03/23/19  9:41 PM  Result Value Ref Range   Color, Urine YELLOW YELLOW   APPearance CLOUDY (A) CLEAR   Specific Gravity, Urine 1.033 (H) 1.005 - 1.030   pH 5.0 5.0 - 8.0   Glucose, UA NEGATIVE NEGATIVE mg/dL   Hgb urine dipstick LARGE (A) NEGATIVE   Bilirubin Urine NEGATIVE NEGATIVE   Ketones, ur 80 (A) NEGATIVE mg/dL   Protein, ur 161 (A) NEGATIVE mg/dL   Nitrite NEGATIVE NEGATIVE   Leukocytes,Ua SMALL (A) NEGATIVE   RBC / HPF >50 (H) 0 - 5 RBC/hpf   WBC, UA >50 (H) 0 - 5 WBC/hpf   Bacteria, UA NONE SEEN NONE SEEN   Squamous Epithelial / LPF 0-5 0 - 5   Mucus PRESENT   Pregnancy, urine POC     Status: Abnormal   Collection Time: 03/23/19 10:02 PM  Result Value Ref Range   Preg Test, Ur  POSITIVE (A) NEGATIVE  Wet prep, genital     Status: Abnormal   Collection Time: 03/23/19 11:22 PM  Result Value Ref Range   Yeast Wet Prep HPF POC NONE SEEN NONE SEEN   Trich, Wet Prep NONE SEEN NONE SEEN   Clue Cells Wet Prep HPF POC NONE SEEN NONE SEEN   WBC, Wet Prep HPF POC MANY (A) NONE SEEN   Sperm NONE SEEN   CBC     Status: Abnormal   Collection Time: 03/23/19 11:23 PM  Result Value Ref Range   WBC 18.7 (H) 4.0 - 10.5 K/uL   RBC 3.90 3.87 - 5.11 MIL/uL  Hemoglobin 12.4 12.0 - 15.0 g/dL   HCT 98.9 21.1 - 94.1 %   MCV 93.1 80.0 - 100.0 fL   MCH 31.8 26.0 - 34.0 pg   MCHC 34.2 30.0 - 36.0 g/dL   RDW 74.0 81.4 - 48.1 %   Platelets 317 150 - 400 K/uL   nRBC 0.0 0.0 - 0.2 %  ABO/Rh     Status: None   Collection Time: 03/23/19 11:23 PM  Result Value Ref Range   ABO/RH(D) B NEG    Antibody Screen NOT NEEDED   hCG, quantitative, pregnancy     Status: Abnormal   Collection Time: 03/23/19 11:23 PM  Result Value Ref Range   hCG, Beta Chain, Quant, S 4,461 (H) <5 mIU/mL  HIV antibody     Status: None   Collection Time: 03/23/19 11:23 PM  Result Value Ref Range   HIV Screen 4th Generation wRfx NON REACTIVE NON REACTIVE  Rho w/u     Status: None (Preliminary result)   Collection Time: 03/24/19 12:03 AM  Result Value Ref Range   Gestational Age(Wks) 10.2    ABO/RH(D) B NEG    Antibody Screen NEG    Unit Number [id number]    Blood Component Type RHIG    Unit division 00    Status of Unit ISSUED    Transfusion Status      OK TO TRANSFUSE Performed at [facility], [address], [place], [place] [address]     US OB LESS THAN 14 WEEKS WITH OB TRANSVAGINAL  Result Date: 03/24/2019 CLINICAL DATA:  Vaginal bleeding EXAM: OBSTETRIC <14 WK [place] AND TRANSVAGINAL OB US TECHNIQUE: Both transabdominal and transvaginal ultrasound examinations were performed for complete evaluation of the gestation as well as the maternal uterus, adnexal regions, and pelvic cul-de-sac.  Transvaginal technique was performed to assess early pregnancy. COMPARISON:  None. FINDINGS: Intrauterine gestational sac: None Yolk sac:  Not Visualized. Embryo:  Not Visualized. Cardiac Activity: Not Visualized. Maternal uterus/adnexae: The right ovary measures 3.1 x 2 x 2.3 cm. There is some mild endometrial thickening and hypervascularity. There is a small hyperdense 1 cm nodule in the right ovary. There is a corpus luteal cyst. The left ovary is unremarkable measures 2 x 1.3 x 1.4 cm. IMPRESSION: 1. No IUP is visualized. By definition, in the setting of a positive pregnancy test, this reflects a pregnancy of unknown location. Differential considerations include early normal IUP, abnormal IUP/missed abortion, or nonvisualized ectopic pregnancy. Serial beta HCG is suggested. Consider repeat pelvic ultrasound in 14 days. 2. Slightly thickened endometrial stripe with increased vascularity. Electronically Signed   By: Katherine Mantle M.D.   On: 03/24/2019 00:21    Assessment and Plan  Pregnancy of unknown anatomic location  - Information provided on ectopic pregnancy   Bleeding in early pregnancy  - Information provided on vaginal bleeding during pregnancy 1st trimester - Advised to return to MAU for increased bleeding soaking a pad every hour for 2 hours   Nausea/vomiting in pregnancy - Rx for Zofran 4 mg ODT sent - Information provided on morning sickness   Threatened miscarriage in early pregnancy  - Advised to take Ibuprofen 600 mg every 6 hours as needed for pain - Information provided on threatened miscarriage   - Discharge patient - Appt made for patient to have repeat HCG level on Monday at [facility] 03/26/2019 @ 1:30 PM  message sent to admin pool - Patient verbalized an understanding of the plan of care and agrees.  Raelyn Moraolitta Trevin Gartrell, MSN, CNM 03/23/2019, 11:13 PM

## 2019-03-24 DIAGNOSIS — O3680X0 Pregnancy with inconclusive fetal viability, not applicable or unspecified: Secondary | ICD-10-CM

## 2019-03-24 DIAGNOSIS — O200 Threatened abortion: Secondary | ICD-10-CM | POA: Diagnosis present

## 2019-03-24 DIAGNOSIS — O219 Vomiting of pregnancy, unspecified: Secondary | ICD-10-CM | POA: Diagnosis present

## 2019-03-24 DIAGNOSIS — O209 Hemorrhage in early pregnancy, unspecified: Secondary | ICD-10-CM | POA: Diagnosis present

## 2019-03-24 LAB — WET PREP, GENITAL
Clue Cells Wet Prep HPF POC: NONE SEEN
Sperm: NONE SEEN
Trich, Wet Prep: NONE SEEN
Yeast Wet Prep HPF POC: NONE SEEN

## 2019-03-24 LAB — HIV ANTIBODY (ROUTINE TESTING W REFLEX): HIV Screen 4th Generation wRfx: NONREACTIVE

## 2019-03-24 LAB — ABO/RH: ABO/RH(D): B NEG

## 2019-03-24 LAB — HCG, QUANTITATIVE, PREGNANCY: hCG, Beta Chain, Quant, S: 4461 m[IU]/mL — ABNORMAL HIGH (ref <5)

## 2019-03-24 MED ORDER — RHO D IMMUNE GLOBULIN 1500 UNIT/2ML IJ SOSY
300.0000 ug | PREFILLED_SYRINGE | Freq: Once | INTRAMUSCULAR | Status: AC
  Administered 2019-03-24: 01:00:00 300 ug via INTRAMUSCULAR
  Filled 2019-03-24: qty 2

## 2019-03-24 MED ORDER — ONDANSETRON 4 MG PO TBDP: 4.0000 mg | ORAL_TABLET | Freq: Three times a day (TID) | ORAL | 0 refills | Status: DC | PRN

## 2019-03-24 MED ORDER — OMEPRAZOLE 20 MG PO CPDR: 20.0000 mg | DELAYED_RELEASE_CAPSULE | Freq: Two times a day (BID) | ORAL | 0 refills | Status: DC

## 2019-03-24 MED ORDER — FAMOTIDINE IN NACL 20-0.9 MG/50ML-% IV SOLN
20.0000 mg | Freq: Once | INTRAVENOUS | Status: AC
  Administered 2019-03-24: 20 mg via INTRAVENOUS
  Filled 2019-03-24: qty 50

## 2019-03-24 NOTE — MAU Note (Addendum)
Pt reports epigastric pain is coming back after multiple doses of medication. Reports the pain is similar to the pain she has had in past ED visits. MAU provider made aware. No new orders. Pt to be discharged home and instructed to pick up prescriptions. Follow up instructions provided to patient with strict return precautions. Pt taken to friends car by NT in a wheelchair.

## 2019-03-24 NOTE — Discharge Instructions (Signed)
Your ultrasound does not show a pregnancy inside your uterus. Therefore, you will need to have your blood pregnancy hormone level redrawn on Monday 03/26/2019. It is important that you have this blood work done on this date in order for [place] to best determine if this is a normally growing pregnancy. Your HCG (blood pregnancy hormone level) from tonight was 4,461. This number should double every 48 hours in a normally growing pregnancy. That is why it is important for you to get it drawn on the day instructed

## 2019-03-25 ENCOUNTER — Emergency Department (HOSPITAL_COMMUNITY)
Admission: EM | Admit: 2019-03-25 | Discharge: 2019-03-25 | Disposition: A | Discharge status: Home / Self Care | Payer: No Typology Code available for payment source | Attending: Emergency Medicine | Admitting: Emergency Medicine

## 2019-03-25 ENCOUNTER — Other Ambulatory Visit: Payer: Self-pay

## 2019-03-25 ENCOUNTER — Encounter (HOSPITAL_COMMUNITY): Payer: Self-pay

## 2019-03-25 DIAGNOSIS — O99511 Diseases of the respiratory system complicating pregnancy, first trimester: Secondary | ICD-10-CM | POA: Insufficient documentation

## 2019-03-25 DIAGNOSIS — O209 Hemorrhage in early pregnancy, unspecified: Secondary | ICD-10-CM | POA: Diagnosis not present

## 2019-03-25 DIAGNOSIS — J45909 Unspecified asthma, uncomplicated: Secondary | ICD-10-CM | POA: Insufficient documentation

## 2019-03-25 DIAGNOSIS — Z3A00 Weeks of gestation of pregnancy not specified: Secondary | ICD-10-CM | POA: Diagnosis not present

## 2019-03-25 DIAGNOSIS — Z87891 Personal history of nicotine dependence: Secondary | ICD-10-CM | POA: Diagnosis not present

## 2019-03-25 DIAGNOSIS — R1013 Epigastric pain: Secondary | ICD-10-CM | POA: Insufficient documentation

## 2019-03-25 DIAGNOSIS — O219 Vomiting of pregnancy, unspecified: Secondary | ICD-10-CM | POA: Diagnosis not present

## 2019-03-25 DIAGNOSIS — O26891 Other specified pregnancy related conditions, first trimester: Secondary | ICD-10-CM | POA: Diagnosis not present

## 2019-03-25 DIAGNOSIS — R112 Nausea with vomiting, unspecified: Secondary | ICD-10-CM

## 2019-03-25 DIAGNOSIS — Z79899 Other long term (current) drug therapy: Secondary | ICD-10-CM | POA: Insufficient documentation

## 2019-03-25 LAB — CBC
HCT: 40.6 % (ref 36.0–46.0)
Hemoglobin: 14.3 g/dL (ref 12.0–15.0)
MCH: 32.3 pg (ref 26.0–34.0)
MCHC: 35.2 g/dL (ref 30.0–36.0)
MCV: 91.6 fL (ref 80.0–100.0)
Platelets: 354 10*3/uL (ref 150–400)
RBC: 4.43 MIL/uL (ref 3.87–5.11)
RDW: 12.2 % (ref 11.5–15.5)
WBC: 18.2 10*3/uL — ABNORMAL HIGH (ref 4.0–10.5)
nRBC: 0 % (ref 0.0–0.2)

## 2019-03-25 LAB — COMPREHENSIVE METABOLIC PANEL
ALT: 28 U/L (ref 0–44)
AST: 21 U/L (ref 15–41)
Albumin: 4.6 g/dL (ref 3.5–5.0)
Alkaline Phosphatase: 78 U/L (ref 38–126)
Anion gap: 14 (ref 5–15)
BUN: 12 mg/dL (ref 6–20)
CO2: 25 mmol/L (ref 22–32)
Calcium: 10.3 mg/dL (ref 8.9–10.3)
Chloride: 100 mmol/L (ref 98–111)
Creatinine, Ser: 0.88 mg/dL (ref 0.44–1.00)
GFR calc Af Amer: >60 mL/min (ref >60)
GFR calc non Af Amer: >60 mL/min (ref >60)
Glucose, Bld: 96 mg/dL (ref 70–99)
Potassium: 3.2 mmol/L — ABNORMAL LOW (ref 3.5–5.1)
Sodium: 139 mmol/L (ref 135–145)
Total Bilirubin: 0.9 mg/dL (ref 0.3–1.2)
Total Protein: 7.9 g/dL (ref 6.5–8.1)

## 2019-03-25 LAB — I-STAT BETA HCG BLOOD, ED (MC, WL, AP ONLY): I-stat hCG, quantitative: 1983.6 m[IU]/mL — ABNORMAL HIGH (ref <5)

## 2019-03-25 LAB — URINALYSIS, ROUTINE W REFLEX MICROSCOPIC
Bilirubin Urine: NEGATIVE
Glucose, UA: NEGATIVE mg/dL
Ketones, ur: 80 mg/dL — AB
Nitrite: NEGATIVE
Protein, ur: 30 mg/dL — AB
Specific Gravity, Urine: 1.019 (ref 1.005–1.030)
WBC, UA: >50 WBC/hpf — ABNORMAL HIGH (ref 0–5)
pH: 5 (ref 5.0–8.0)

## 2019-03-25 LAB — RH IG WORKUP (INCLUDES ABO/RH)
ABO/RH(D): B NEG
Antibody Screen: NEGATIVE
Gestational Age(Wks): 10.2
Unit division: 0

## 2019-03-25 LAB — LIPASE, BLOOD: Lipase: 20 U/L (ref 11–51)

## 2019-03-25 MED ORDER — SODIUM CHLORIDE 0.9 % IV BOLUS
1000.0000 mL | Freq: Once | INTRAVENOUS | Status: AC
  Administered 2019-03-25: 16:00:00 1000 mL via INTRAVENOUS

## 2019-03-25 MED ORDER — PROCHLORPERAZINE MALEATE 10 MG PO TABS: 10.0000 mg | ORAL_TABLET | Freq: Two times a day (BID) | ORAL | 0 refills | Status: DC | PRN

## 2019-03-25 MED ORDER — PROCHLORPERAZINE EDISYLATE 10 MG/2ML IJ SOLN
10.0000 mg | Freq: Once | INTRAMUSCULAR | Status: AC
  Administered 2019-03-25: 10 mg via INTRAVENOUS
  Filled 2019-03-25: qty 2

## 2019-03-25 MED ORDER — DIPHENHYDRAMINE HCL 50 MG/ML IJ SOLN
25.0000 mg | Freq: Once | INTRAMUSCULAR | Status: AC
  Administered 2019-03-25: 16:00:00 25 mg via INTRAVENOUS
  Filled 2019-03-25: qty 1

## 2019-03-25 MED ORDER — SODIUM CHLORIDE 0.9 % IV BOLUS
1000.0000 mL | Freq: Once | INTRAVENOUS | Status: AC
  Administered 2019-03-25: 17:00:00 1000 mL via INTRAVENOUS

## 2019-03-25 MED ORDER — SODIUM CHLORIDE 0.9% FLUSH: 3.0000 mL | Freq: Once | INTRAVENOUS | Status: DC

## 2019-03-25 NOTE — Discharge Instructions (Signed)
Please read and follow all provided instructions.  Your diagnoses today include:  1. Non-intractable vomiting with nausea, unspecified vomiting type     Tests performed today include:  Blood counts and electrolytes  Urine test -suggests dehydration  Vital signs. See below for your results today.   Medications prescribed:   Compazine -medication for nausea and vomiting  Take any prescribed medications only as directed.  Home care instructions:  Follow any educational materials contained in this packet.  BE VERY CAREFUL not to take multiple medicines containing Tylenol (also called acetaminophen). Doing so can lead to an overdose which can damage your liver and cause liver failure and possibly death.   Follow-up instructions: Please follow-up with your OB/GYN as previously discussed for repeat blood testing and repeat ultrasound.  Return instructions:   Please return to the Emergency Department if you experience worsening symptoms.   Return if you develop abdominal pain, persistent vomiting, heavier vaginal bleeding.  Please return if you have any other emergent concerns.  Additional Information:  Your vital signs today were: BP (!) 147/86 (BP Location: Left Arm)   Pulse 83   Temp 98.5 F (36.9 C)   Resp 16   Ht 5\' 5"  (1.651 m)   Wt 68 kg   LMP 01/11/2019   SpO2 100%   BMI 24.96 kg/m  If your blood pressure (BP) was elevated above 135/85 this visit, please have this repeated by your doctor within one month. --------------

## 2019-03-25 NOTE — ED Notes (Signed)
Pt reminded of the need for urine.  

## 2019-03-25 NOTE — ED Notes (Signed)
Patient verbalizes understanding of discharge instructions. Opportunity for questioning and answers were provided. Armband removed by staff, pt discharged from ED.  

## 2019-03-25 NOTE — ED Provider Notes (Signed)
[facility] EMERGENCY DEPARTMENT Provider Note   CSN: [id number] Arrival date & time: 03/25/19  1355     History No chief complaint on file.   Brittney Khan is a 33 y.o. female.  Patient presents to the emergency department today with complaint of epigastric discomfort as well as persistent nausea and vomiting over the past 2 days.  Patient is in the first trimester of pregnancy.  She had some vaginal bleeding and was evaluated in the MAU on 03/23/2019.  Patient had a ultrasound which was indeterminant.  She is due to have a repeat ultrasound on 12/28 and a repeat beta-hCG drawn tomorrow.  Patient does not have any significant lower abdominal pain or back pain.  She states that she still has some light bleeding which is largely unchanged over the past day but improved from her visit 2 days ago.  No urinary symptoms.  She has been prescribed medications for vomiting however cannot keep anything down and vomits even after drinking small sips of fluid.  Onset of symptoms acute.  Course is constant.        Past Medical History:  Diagnosis Date   Asthma    Hip fracture, right ([place]) 2010    Patient Active Problem List   Diagnosis Date Noted   Pregnancy of unknown anatomic location 03/24/2019   Nausea/vomiting in pregnancy 03/24/2019   Threatened miscarriage in early pregnancy 03/24/2019   Vaginal bleeding affecting early pregnancy 03/24/2019   Left knee pain 08/06/2015   Nausea vomiting and diarrhea 12/23/2012   Infectious colitis 12/23/2012    History reviewed. No pertinent surgical history.   OB History    Gravida  1   Para      Term      Preterm      AB      Living        SAB      TAB      Ectopic      Multiple      Live Births              No family history on file.  Social History   Tobacco Use   Smoking status: Former Smoker    Quit date: 04/05/2010    Years since quitting: 8.9   Smokeless tobacco: Never Used   Substance Use Topics   Alcohol use: Yes    Alcohol/week: 0.0 standard drinks    Comment: socially   Drug use: Yes    Frequency: 1.0 times per week    Types: Marijuana    Home Medications Prior to Admission medications   Medication Sig Start Date End Date Taking? Authorizing Provider  albuterol (PROVENTIL HFA;VENTOLIN HFA) 108 (90 BASE) MCG/ACT inhaler Inhale 1-2 puffs into the lungs every 6 (six) hours as needed for wheezing or shortness of breath.    [provider]  albuterol (PROVENTIL) (2.5 MG/3ML) 0.083% nebulizer solution Take 3 mLs (2.5 mg total) by nebulization every 6 (six) hours as needed for wheezing or shortness of breath. 11/30/13   Harvie Heck, PA-C  famotidine (PEPCID) 20 MG tablet Take 1 tablet (20 mg total) by mouth 2 (two) times daily. 02/26/18   Jacqlyn Larsen, PA-C  omeprazole (PRILOSEC) 20 MG capsule Take 1 capsule (20 mg total) by mouth 2 (two) times daily before a meal. 03/24/19   Laury Deep, CNM  ondansetron (ZOFRAN ODT) 4 MG disintegrating tablet Take 1 tablet (4 mg total) by mouth every 8 (eight) hours as needed  for nausea or vomiting. 03/24/19   Raelyn Mora, CNM    Allergies    Reglan [metoclopramide]  Review of Systems   Review of Systems  Constitutional: Negative for fever.  HENT: Negative for rhinorrhea and sore throat.   Eyes: Negative for redness.  Respiratory: Negative for cough.   Cardiovascular: Negative for chest pain.  Gastrointestinal: Positive for abdominal pain (epigastric), nausea and vomiting. Negative for diarrhea.  Genitourinary: Positive for vaginal bleeding. Negative for dysuria.  Musculoskeletal: Negative for myalgias.  Skin: Negative for rash.  Neurological: Negative for headaches.    Physical Exam Updated Vital Signs BP (!) 133/96 (BP Location: Left Arm)    Pulse 67    Temp 98.5 F (36.9 C)    Resp 16    Ht  (1.651 m)    Wt 68 kg    LMP 01/11/2019    SpO2 99%    BMI 24.96 kg/m   Physical  Exam Vitals and nursing note reviewed.  Constitutional:      Appearance: She is well-developed.  HENT:     Head: Normocephalic and atraumatic.     Mouth/Throat:     Mouth: Mucous membranes are dry.  Eyes:     General:        Right eye: No discharge.        Left eye: No discharge.     Conjunctiva/sclera: Conjunctivae normal.  Cardiovascular:     Rate and Rhythm: Normal rate and regular rhythm.     Heart sounds: Normal heart sounds.  Pulmonary:     Effort: Pulmonary effort is normal.     Breath sounds: Normal breath sounds.  Abdominal:     Palpations: Abdomen is soft.     Tenderness: There is no abdominal tenderness. There is no guarding or rebound.  Musculoskeletal:     Cervical back: Normal range of motion and neck supple.  Skin:    General: Skin is warm and dry.  Neurological:     Mental Status: She is alert.     ED Results / Procedures / Treatments   Labs (all labs ordered are listed, but only abnormal results are displayed) Labs Reviewed  COMPREHENSIVE METABOLIC PANEL - Abnormal; Notable for the following components:      Result Value   Potassium 3.2 (*)    All other components within normal limits  CBC - Abnormal; Notable for the following components:   WBC 18.2 (*)    All other components within normal limits  I-STAT BETA HCG BLOOD, ED ([facility], [facility], [facility] ONLY) - Abnormal; Notable for the following components:   I-stat hCG, quantitative 1,983.6 (*)    All other components within normal limits  LIPASE, BLOOD  URINALYSIS, ROUTINE W REFLEX MICROSCOPIC    EKG None  Radiology US OB LESS THAN 14 WEEKS WITH OB TRANSVAGINAL  Result Date: 03/24/2019 CLINICAL DATA:  Vaginal bleeding EXAM: OBSTETRIC <14 WK [place] AND TRANSVAGINAL OB US TECHNIQUE: Both transabdominal and transvaginal ultrasound examinations were performed for complete evaluation of the gestation as well as the maternal uterus, adnexal regions, and pelvic cul-de-sac. Transvaginal technique was performed to assess  early pregnancy. COMPARISON:  None. FINDINGS: Intrauterine gestational sac: None Yolk sac:  Not Visualized. Embryo:  Not Visualized. Cardiac Activity: Not Visualized. Maternal uterus/adnexae: The right ovary measures 3.1 x 2 x 2.3 cm. There is some mild endometrial thickening and hypervascularity. There is a small hyperdense 1 cm nodule in the right ovary. There is a corpus luteal cyst. The left ovary  is unremarkable measures 2 x 1.3 x 1.4 cm. IMPRESSION: 1. No IUP is visualized. By definition, in the setting of a positive pregnancy test, this reflects a pregnancy of unknown location. Differential considerations include early normal IUP, abnormal IUP/missed abortion, or nonvisualized ectopic pregnancy. Serial beta HCG is suggested. Consider repeat pelvic ultrasound in 14 days. 2. Slightly thickened endometrial stripe with increased vascularity. Electronically Signed   By: Katherine Mantlehristopher  Green M.D.   On: 03/24/2019 00:21    Procedures Procedures (including critical care time)  Medications Ordered in ED Medications  sodium chloride flush (NS) 0.9 % injection 3 mL (has no administration in time range)  prochlorperazine (COMPAZINE) injection 10 mg (10 mg Intravenous Given 03/25/19 1550)  diphenhydrAMINE (BENADRYL) injection 25 mg (25 mg Intravenous Given 03/25/19 1549)  sodium chloride 0.9 % bolus 1,000 mL (0 mLs Intravenous Stopped 03/25/19 1720)  sodium chloride 0.9 % bolus 1,000 mL (1,000 mLs Intravenous New Bag/Given 03/25/19 1721)    ED Course  I have reviewed the triage vital signs and the nursing notes.  Pertinent labs & imaging results that were available during my care of the patient were reviewed by me and considered in my medical decision making (see chart for details).  Patient seen and examined.  She is not in any distress.  No lower abdominal tenderness or signs of peritonitis on exam.  Awaiting results of labs.  I-STAT beta-hCG lower than lab beta-hCG drawn 2 days ago however these are  different tests.  Patient had a thorough GYN work-up 2 days ago.  Do not feel that reimaging is necessary unless there is some compelling indication on exam or labs.  Fluids and antiemetics ordered.  Vital signs reviewed and are as follows: BP (!) 133/96 (BP Location: Left Arm)    Pulse 67    Temp 98.5 F (36.9 C)    Resp 16    Ht 5\' 5"  (1.651 m)    Wt 68 kg    LMP 01/11/2019    SpO2 99%    BMI 24.96 kg/m   Patient has done well with IV Compazine and administration of IV fluids.  She is feeling better.  She is drinking now.  Abdomen exam remains benign.  Patient is comfortable with discharge to home.  Will give prescription for oral Compazine.  She is encouraged to follow-up with her OB/GYN as planned for repeat beta hCG testing and repeat ultrasound.   Encouraged return to the emergency department with persistent vomiting, development of more severe abdominal pain, syncope, worsening vaginal bleeding.    MDM Rules/Calculators/[name]                      Patient with likely ongoing miscarriage, decreasing beta-hCG levels, recent evaluation in the MAU --presents with epigastric discomfort and nausea and vomiting.  This has been ongoing since the patient's previous evaluation.  Poor control with home antiemetics.  She does appear dehydrated.  From an OB/GYN standpoint, symptoms are stable.  She has not had any heavier bleeding.  Abdomen is soft, nontender without any signs of peritonitis.  I have very low concern for ruptured ectopic pregnancy today.  Plan for ultrasound repeat next week.  Patient responded well to IV medications and fluids here in emergency department tonight.   Final Clinical Impression(s) / ED Diagnoses Final diagnoses:  Non-intractable vomiting with nausea, unspecified vomiting type    Rx / DC Orders ED Discharge Orders    None       Royale Lennartz,  Glo Herring 03/25/19 Margretta Ditty    Gwyneth Sprout, MD 03/25/19 2255

## 2019-03-25 NOTE — ED Triage Notes (Signed)
Patient complains of 5 days olf abdominal pain with 3 days of emesis. Patient reports that she has miscarriage on Friday and has had vomiting ever since being discharged

## 2019-03-25 NOTE — ED Notes (Signed)
Pt given sprite for PO challenge

## 2019-03-26 ENCOUNTER — Encounter: Payer: Self-pay | Admitting: *Deleted

## 2019-03-26 ENCOUNTER — Ambulatory Visit (INDEPENDENT_AMBULATORY_CARE_PROVIDER_SITE_OTHER): Payer: Self-pay | Admitting: *Deleted

## 2019-03-26 ENCOUNTER — Telehealth (INDEPENDENT_AMBULATORY_CARE_PROVIDER_SITE_OTHER): Payer: No Typology Code available for payment source | Admitting: *Deleted

## 2019-03-26 VITALS — BP 127/84 | HR 63 | Temp 98.4°F | Ht 65.0 in | Wt 152.0 lb

## 2019-03-26 DIAGNOSIS — O3680X0 Pregnancy with inconclusive fetal viability, not applicable or unspecified: Secondary | ICD-10-CM

## 2019-03-26 DIAGNOSIS — O209 Hemorrhage in early pregnancy, unspecified: Secondary | ICD-10-CM

## 2019-03-26 LAB — URINE CULTURE: Culture: NO GROWTH

## 2019-03-26 LAB — GC/CHLAMYDIA PROBE AMP (~~LOC~~) NOT AT ARMC
Chlamydia: NEGATIVE
Comment: NEGATIVE
Comment: NORMAL
Neisseria Gonorrhea: NEGATIVE

## 2019-03-26 LAB — BETA HCG QUANT (REF LAB): hCG Quant: 1223 m[IU]/mL

## 2019-03-26 NOTE — Telephone Encounter (Signed)
Talked with Dr Darene Lamer regarding BHCG today of 1223. Pt to have repeat stat BHCG on 12/23 and is scheduled at 0900. Pt to take Ibuprofen 800mg  TID and Tylenol 650mg  po q8hrs for pain. If pain worsens despite meds and/or bleeding becomes heavier than currently, pt to go to MAU for assessment. Pt called and after verifying name and DOB, pt given the above information from Dr Darene Lamer. Pt voiced understanding and had no further questions.

## 2019-03-26 NOTE — Progress Notes (Signed)
Here for repeat BHCG. Using about 6 overnight pads since Friday. Few small clots. Today having some pain in LLQ. Pain is sharp and intermittent. No BM since Thurs am. Was seen MAU yesterday for IVFs due to n/v. BHCG 03/23/19 was 4461 and 03/25/19 was 1983.6. Pt understands to wait in lobby for results and may be couple hours.

## 2019-03-28 ENCOUNTER — Ambulatory Visit (INDEPENDENT_AMBULATORY_CARE_PROVIDER_SITE_OTHER): Payer: No Typology Code available for payment source | Admitting: Lactation Services

## 2019-03-28 ENCOUNTER — Other Ambulatory Visit: Payer: Self-pay

## 2019-03-28 DIAGNOSIS — O3680X0 Pregnancy with inconclusive fetal viability, not applicable or unspecified: Secondary | ICD-10-CM

## 2019-03-28 NOTE — Progress Notes (Signed)
Pt here for follow up Beta Hcg. Pt aware she has had a miscarriage. Per Dr. Ilda Basset can cancel [place] for next week.   Pt reports she is intermittent cramping (5/10) some and is having pain when trying to stool, she does not feel like she is constipated.  She is bleeding and it has slowed significantly per pt.   Pt reports she is doing OK emotionally and she does not feel she needs to talk with Roselyn Reef at this time. She reports her family is in [place].   Pt aware that lab to be run non stat and we will have the results tomorrow. She is aware that she will need to be followed until her levels are 0.

## 2019-03-29 LAB — BETA HCG QUANT (REF LAB): hCG Quant: 768 m[IU]/mL

## 2019-03-29 NOTE — Progress Notes (Signed)
Chart reviewed for nurse visit. Agree with plan of care.   Gedeon Brandow L, DO 03/29/2019 8:13 PM

## 2019-04-02 ENCOUNTER — Ambulatory Visit (HOSPITAL_COMMUNITY): Payer: Medicaid Other

## 2019-04-02 ENCOUNTER — Other Ambulatory Visit: Payer: Self-pay | Admitting: *Deleted

## 2019-04-02 DIAGNOSIS — O039 Complete or unspecified spontaneous abortion without complication: Secondary | ICD-10-CM

## 2019-04-02 NOTE — Progress Notes (Signed)
Patient seen and assessed by nursing staff during this encounter. I have reviewed the chart and agree with the documentation and plan.  Dhamar Gregory, MD 04/02/2019 9:29 AM   

## 2019-04-04 ENCOUNTER — Other Ambulatory Visit: Payer: Self-pay

## 2019-04-04 ENCOUNTER — Other Ambulatory Visit: Payer: No Typology Code available for payment source

## 2019-04-04 DIAGNOSIS — O039 Complete or unspecified spontaneous abortion without complication: Secondary | ICD-10-CM

## 2019-04-05 LAB — BETA HCG QUANT (REF LAB): hCG Quant: 170 m[IU]/mL

## 2019-04-09 ENCOUNTER — Telehealth: Payer: Self-pay | Admitting: *Deleted

## 2019-04-09 DIAGNOSIS — O039 Complete or unspecified spontaneous abortion without complication: Secondary | ICD-10-CM

## 2019-04-09 NOTE — Telephone Encounter (Signed)
-----   Message from [place] Bing, MD sent at 04/09/2019 11:04 AM EST ----- Levels are falling well. Continue weekly non stat hcg levels until zero

## 2019-04-09 NOTE — Telephone Encounter (Signed)
I called [place] per Dr. Vergie Living and notified her bhcg dropped to 170 and results/ recommendations per Dr. Vergie Living. I offered her lab only appointment for non stat  bhcg for 04/11/19 at 1:30 which she accepted. She voices understanding.  Crystalee Ventress,RN

## 2019-04-11 ENCOUNTER — Other Ambulatory Visit: Payer: Self-pay

## 2019-04-11 ENCOUNTER — Other Ambulatory Visit: Payer: No Typology Code available for payment source

## 2019-04-11 DIAGNOSIS — O039 Complete or unspecified spontaneous abortion without complication: Secondary | ICD-10-CM

## 2019-04-12 ENCOUNTER — Telehealth: Payer: Self-pay | Admitting: Lactation Services

## 2019-04-12 LAB — BETA HCG QUANT (REF LAB): hCG Quant: 51 m[IU]/mL

## 2019-04-12 NOTE — Telephone Encounter (Signed)
Called pt to give her the there results of Beta Hcg and that she needs to return next week for follow up Beta Hcg. Message to front office to call and get pt scheduled for follow up on 1/13. Pt voiced understanding. She has no questions or concerns at this time.

## 2019-04-12 NOTE — Telephone Encounter (Signed)
-----   Message from [place] Bing, MD sent at 04/12/2019  9:59 AM EST ----- Continue with weekly non stat hcgs until zero. thanks

## 2019-04-17 ENCOUNTER — Other Ambulatory Visit: Payer: Self-pay | Admitting: Lactation Services

## 2019-04-17 DIAGNOSIS — O039 Complete or unspecified spontaneous abortion without complication: Secondary | ICD-10-CM

## 2019-04-18 ENCOUNTER — Other Ambulatory Visit: Payer: No Typology Code available for payment source

## 2019-04-18 ENCOUNTER — Other Ambulatory Visit: Payer: Self-pay

## 2019-04-18 DIAGNOSIS — O039 Complete or unspecified spontaneous abortion without complication: Secondary | ICD-10-CM

## 2019-04-19 LAB — BETA HCG QUANT (REF LAB): hCG Quant: 16 m[IU]/mL

## 2019-04-23 ENCOUNTER — Telehealth: Payer: Self-pay | Admitting: Family Medicine

## 2019-04-23 NOTE — Telephone Encounter (Signed)
I called Brittney Khan as requested and informed her that her last bhcg was 16. I reviewed that we will draw weekly until 0 or at least <5. She voices understanding. [place] Behlke,RN

## 2019-04-23 NOTE — Telephone Encounter (Signed)
Called patient to schedule her appointment. She is requesting her levels.

## 2019-04-25 ENCOUNTER — Other Ambulatory Visit: Payer: Medicaid Other

## 2019-04-25 ENCOUNTER — Other Ambulatory Visit: Payer: Self-pay | Admitting: *Deleted

## 2019-04-25 ENCOUNTER — Other Ambulatory Visit: Payer: Self-pay

## 2019-04-25 DIAGNOSIS — O039 Complete or unspecified spontaneous abortion without complication: Secondary | ICD-10-CM

## 2019-04-26 ENCOUNTER — Telehealth: Payer: Self-pay | Admitting: *Deleted

## 2019-04-26 DIAGNOSIS — O039 Complete or unspecified spontaneous abortion without complication: Secondary | ICD-10-CM

## 2019-04-26 LAB — BETA HCG QUANT (REF LAB): hCG Quant: 8 m[IU]/mL

## 2019-04-26 NOTE — Telephone Encounter (Addendum)
-----   Message from [place] Bing, MD sent at 04/26/2019  3:46 PM EST ----- Needs another non stat beta. Thanks  1/21  1608  Called pt and informed her of test results from yesterday and that she will need a repeat BHCG in 1 week. Pt voiced understanding and agreed to appt on 1/27 @ 1330.

## 2019-05-02 ENCOUNTER — Other Ambulatory Visit: Payer: Medicaid Other

## 2019-05-02 ENCOUNTER — Other Ambulatory Visit: Payer: Self-pay | Admitting: Lactation Services

## 2019-05-02 ENCOUNTER — Other Ambulatory Visit: Payer: Self-pay

## 2019-05-02 DIAGNOSIS — O039 Complete or unspecified spontaneous abortion without complication: Secondary | ICD-10-CM

## 2019-05-02 DIAGNOSIS — O3680X0 Pregnancy with inconclusive fetal viability, not applicable or unspecified: Secondary | ICD-10-CM

## 2019-05-03 ENCOUNTER — Telehealth: Payer: Self-pay

## 2019-05-03 LAB — BETA HCG QUANT (REF LAB): hCG Quant: 2 m[IU]/mL

## 2019-05-03 NOTE — Telephone Encounter (Addendum)
-----   Message from [place] Bing, MD sent at 05/03/2019  8:44 AM EST ----- Please let her know that her quants are down to normal and no need for further ones. thanks  Called pt and informed pt her results and that she does not need to have any more lab draws done.  I informed pt that we can be her GYN for future appt. Pt informed me that she is still have some bleeding that she says that has not stopped since the process started.  I asked has her bleeding slowed down at any point.  She said yes it has slowed down and now I am bleeding having to change a thin pad an hour to hours.  I explained to the pt that it could be period starting and now that her bcg levels are back to normal it should not be related to the pregnancy.  I advised pt to continue to monitor her bleeding that it should start to slow down if this is her period and I that I will call her on Monday to f/u.  Notified Dr. Jolayne Panther who agreed with the plan.  Pt verbalized understanding.

## 2019-05-14 ENCOUNTER — Emergency Department (HOSPITAL_COMMUNITY)
Admission: EM | Admit: 2019-05-14 | Discharge: 2019-05-14 | Disposition: A | Discharge status: Home / Self Care | Payer: No Typology Code available for payment source | Attending: Emergency Medicine | Admitting: Emergency Medicine

## 2019-05-14 ENCOUNTER — Other Ambulatory Visit: Payer: Self-pay

## 2019-05-14 ENCOUNTER — Encounter (HOSPITAL_COMMUNITY): Payer: Self-pay | Admitting: Emergency Medicine

## 2019-05-14 DIAGNOSIS — R112 Nausea with vomiting, unspecified: Secondary | ICD-10-CM | POA: Diagnosis present

## 2019-05-14 DIAGNOSIS — T450X5A Adverse effect of antiallergic and antiemetic drugs, initial encounter: Secondary | ICD-10-CM | POA: Diagnosis not present

## 2019-05-14 DIAGNOSIS — R1013 Epigastric pain: Secondary | ICD-10-CM | POA: Insufficient documentation

## 2019-05-14 DIAGNOSIS — G2402 Drug induced acute dystonia: Secondary | ICD-10-CM | POA: Diagnosis not present

## 2019-05-14 LAB — CBC WITH DIFFERENTIAL/PLATELET
Abs Immature Granulocytes: 0.06 10*3/uL (ref 0.00–0.07)
Basophils Absolute: 0 10*3/uL (ref 0.0–0.1)
Basophils Relative: 0 %
Eosinophils Absolute: 0 10*3/uL (ref 0.0–0.5)
Eosinophils Relative: 0 %
HCT: 42.7 % (ref 36.0–46.0)
Hemoglobin: 14.2 g/dL (ref 12.0–15.0)
Immature Granulocytes: 0 %
Lymphocytes Relative: 15 %
Lymphs Abs: 2.4 10*3/uL (ref 0.7–4.0)
MCH: 31.8 pg (ref 26.0–34.0)
MCHC: 33.3 g/dL (ref 30.0–36.0)
MCV: 95.5 fL (ref 80.0–100.0)
Monocytes Absolute: 0.7 10*3/uL (ref 0.1–1.0)
Monocytes Relative: 4 %
Neutro Abs: 13.4 10*3/uL — ABNORMAL HIGH (ref 1.7–7.7)
Neutrophils Relative %: 81 %
Platelets: 309 10*3/uL (ref 150–400)
RBC: 4.47 MIL/uL (ref 3.87–5.11)
RDW: 11.9 % (ref 11.5–15.5)
WBC: 16.6 10*3/uL — ABNORMAL HIGH (ref 4.0–10.5)
nRBC: 0 % (ref 0.0–0.2)

## 2019-05-14 LAB — I-STAT BETA HCG BLOOD, ED (MC, WL, AP ONLY): I-stat hCG, quantitative: <5 m[IU]/mL (ref <5)

## 2019-05-14 LAB — URINALYSIS, ROUTINE W REFLEX MICROSCOPIC
Bilirubin Urine: NEGATIVE
Glucose, UA: NEGATIVE mg/dL
Hgb urine dipstick: NEGATIVE
Ketones, ur: 20 mg/dL — AB
Leukocytes,Ua: NEGATIVE
Nitrite: NEGATIVE
Protein, ur: NEGATIVE mg/dL
Specific Gravity, Urine: 1.012 (ref 1.005–1.030)
pH: 8 (ref 5.0–8.0)

## 2019-05-14 LAB — RAPID URINE DRUG SCREEN, HOSP PERFORMED
Amphetamines: NOT DETECTED
Barbiturates: NOT DETECTED
Benzodiazepines: POSITIVE — AB
Cocaine: NOT DETECTED
Opiates: NOT DETECTED
Tetrahydrocannabinol: POSITIVE — AB

## 2019-05-14 LAB — COMPREHENSIVE METABOLIC PANEL
ALT: 15 U/L (ref 0–44)
AST: 21 U/L (ref 15–41)
Albumin: 4.4 g/dL (ref 3.5–5.0)
Alkaline Phosphatase: 89 U/L (ref 38–126)
Anion gap: 14 (ref 5–15)
BUN: 8 mg/dL (ref 6–20)
CO2: 23 mmol/L (ref 22–32)
Calcium: 9.6 mg/dL (ref 8.9–10.3)
Chloride: 103 mmol/L (ref 98–111)
Creatinine, Ser: 0.96 mg/dL (ref 0.44–1.00)
GFR calc Af Amer: >60 mL/min (ref >60)
GFR calc non Af Amer: >60 mL/min (ref >60)
Glucose, Bld: 135 mg/dL — ABNORMAL HIGH (ref 70–99)
Potassium: 3.1 mmol/L — ABNORMAL LOW (ref 3.5–5.1)
Sodium: 140 mmol/L (ref 135–145)
Total Bilirubin: 0.4 mg/dL (ref 0.3–1.2)
Total Protein: 7.4 g/dL (ref 6.5–8.1)

## 2019-05-14 LAB — LIPASE, BLOOD: Lipase: 19 U/L (ref 11–51)

## 2019-05-14 MED ORDER — FAMOTIDINE IN NACL 20-0.9 MG/50ML-% IV SOLN
20.0000 mg | Freq: Once | INTRAVENOUS | Status: AC
  Administered 2019-05-14: 20 mg via INTRAVENOUS
  Filled 2019-05-14: qty 50

## 2019-05-14 MED ORDER — ONDANSETRON HCL 4 MG/2ML IJ SOLN
4.0000 mg | Freq: Once | INTRAMUSCULAR | Status: AC
  Administered 2019-05-14: 4 mg via INTRAVENOUS
  Filled 2019-05-14: qty 2

## 2019-05-14 MED ORDER — MORPHINE SULFATE (PF) 4 MG/ML IV SOLN
4.0000 mg | Freq: Once | INTRAVENOUS | Status: AC
  Administered 2019-05-14: 4 mg via INTRAVENOUS
  Filled 2019-05-14: qty 1

## 2019-05-14 MED ORDER — PROMETHAZINE HCL 25 MG PO TABS: 25.0000 mg | ORAL_TABLET | Freq: Four times a day (QID) | ORAL | 0 refills | Status: DC | PRN

## 2019-05-14 MED ORDER — PROMETHAZINE HCL 25 MG/ML IJ SOLN: 12.5000 mg | Freq: Once | INTRAMUSCULAR | Status: DC

## 2019-05-14 MED ORDER — SODIUM CHLORIDE 0.9 % IV BOLUS
1000.0000 mL | Freq: Once | INTRAVENOUS | Status: AC
  Administered 2019-05-14: 09:00:00 1000 mL via INTRAVENOUS

## 2019-05-14 MED ORDER — LORAZEPAM 2 MG/ML IJ SOLN
0.5000 mg | Freq: Once | INTRAMUSCULAR | Status: DC
  Filled 2019-05-14: qty 1

## 2019-05-14 MED ORDER — DIPHENHYDRAMINE HCL 50 MG/ML IJ SOLN
INTRAMUSCULAR | Status: AC
  Filled 2019-05-14: qty 1

## 2019-05-14 MED ORDER — LORAZEPAM 2 MG/ML IJ SOLN
1.0000 mg | Freq: Once | INTRAMUSCULAR | Status: AC
  Administered 2019-05-14: 11:00:00 2 mg via INTRAVENOUS

## 2019-05-14 NOTE — ED Triage Notes (Signed)
Pt endorses mid upper abd pain, N/V for about 30 mins. Endorses a hx of this pain but unknown cause.

## 2019-05-14 NOTE — ED Notes (Signed)
After Zofran pt became anxious and ambulating in room, difficult to return to bed without assist.

## 2019-05-14 NOTE — ED Provider Notes (Signed)
[facility] EMERGENCY DEPARTMENT Provider Note   CSN: [id number] Arrival date & time: 05/14/19  6160     History Chief Complaint  Patient presents with  . Abdominal Pain  . Emesis    [facility] is a 34 y.o. female.  [facility] is a 34 y.o. female with history of asthma, and recent miscarriage, who presents to the emergency department for evaluation of epigastric abdominal pain, nausea and vomiting.  Symptoms started around 5:30 AM this morning.  Patient reports she has had numerous episodes of nonbloody, nonbilious emesis.  She denies any associated diarrhea, constipation or blood in her stools.  Reports pain is well localized to the epigastric region and does not radiate to the back or chest.  She denies any associated chest pain or shortness of breath.  She is not had any fevers or chills.  She reports pain began after she drank heavily last night, taking numerous shots of Patron.  Patient denies other drug or substance abuse.  She denies any associated lower abdominal pain, dysuria, vaginal bleeding or discharge.  She does state that she gets severe episodes of vomiting like this sometimes when she is on her menstrual cycle which ended last week.  Otherwise denies any previous history.        Past Medical History:  Diagnosis Date  . Asthma   . Hip fracture, right [facility]) 2010    Patient Active Problem List   Diagnosis Date Noted  . Pregnancy of unknown anatomic location 03/24/2019  . Nausea/vomiting in pregnancy 03/24/2019  . Threatened miscarriage in early pregnancy 03/24/2019  . Vaginal bleeding affecting early pregnancy 03/24/2019  . Left knee pain 08/06/2015  . Nausea vomiting and diarrhea 12/23/2012  . Infectious colitis 12/23/2012    History reviewed. No pertinent surgical history.   OB History    Gravida  1   Para      Term      Preterm      AB      Living        SAB      TAB      Ectopic      Multiple      Live  Births              No family history on file.  Social History   Tobacco Use  . Smoking status: Former Smoker    Quit date: 04/05/2010    Years since quitting: 9.1  . Smokeless tobacco: Never Used  Substance Use Topics  . Alcohol use: Yes    Alcohol/week: 0.0 standard drinks    Comment: socially  . Drug use: Yes    Frequency: 1.0 times per week    Types: Marijuana    Home Medications Prior to Admission medications   Medication Sig Start Date End Date Taking? Authorizing Provider  albuterol (PROVENTIL HFA;VENTOLIN HFA) 108 (90 BASE) MCG/ACT inhaler Inhale 1-2 puffs into the lungs every 6 (six) hours as needed for wheezing or shortness of breath.    [provider]  albuterol (PROVENTIL) (2.5 MG/3ML) 0.083% nebulizer solution Take 3 mLs (2.5 mg total) by nebulization every 6 (six) hours as needed for wheezing or shortness of breath. 11/30/13   Mellody Drown, PA-C  famotidine (PEPCID) 20 MG tablet Take 1 tablet (20 mg total) by mouth 2 (two) times daily. Patient not taking: Reported on 03/28/2019 02/26/18   Dartha Lodge, PA-C  omeprazole (PRILOSEC) 20 MG capsule Take 1 capsule (20 mg total)  by mouth 2 (two) times daily before a meal. Patient not taking: Reported on 03/28/2019 03/24/19   Laury Deep, CNM  ondansetron (ZOFRAN ODT) 4 MG disintegrating tablet Take 1 tablet (4 mg total) by mouth every 8 (eight) hours as needed for nausea or vomiting. Patient not taking: Reported on 03/28/2019 03/24/19   Laury Deep, CNM  prochlorperazine (COMPAZINE) 10 MG tablet Take 1 tablet (10 mg total) by mouth 2 (two) times daily as needed for nausea or vomiting. Patient not taking: Reported on 03/28/2019 03/25/19   Carlisle Cater, PA-C    Allergies    Reglan [metoclopramide]  Review of Systems   Review of Systems  Constitutional: Negative for chills and fever.  HENT: Negative.   Respiratory: Negative for cough and shortness of breath.   Cardiovascular: Negative for chest  pain.  Gastrointestinal: Positive for abdominal pain, nausea and vomiting. Negative for blood in stool, constipation and diarrhea.  Genitourinary: Negative for dysuria, frequency, pelvic pain, vaginal bleeding and vaginal discharge.  Musculoskeletal: Negative for arthralgias, back pain and myalgias.  Skin: Negative for color change and rash.  Neurological: Negative for dizziness, syncope and light-headedness.  All other systems reviewed and are negative.   Physical Exam Updated Vital Signs BP (!) 141/108 (BP Location: Left Arm)   Pulse (!) 55   Resp 16   Ht 5\' 5"  (1.651 m)   Wt 74.8 kg   LMP 01/11/2019   SpO2 99%   BMI 27.46 kg/m   Physical Exam Vitals and nursing note reviewed.  Constitutional:      General: She is in acute distress.     Appearance: She is well-developed. She is not diaphoretic.     Comments: Patient appears uncomfortable she is actively vomiting on arrival and in some distress but is alert and able to provide history.  HENT:     Head: Normocephalic and atraumatic.  Eyes:     General:        Right eye: No discharge.        Left eye: No discharge.     Comments: Pupil exam is limited as patient has colored contacts and  Cardiovascular:     Rate and Rhythm: Normal rate and regular rhythm.     Heart sounds: Normal heart sounds. No murmur. No friction rub. No gallop.   Pulmonary:     Effort: Pulmonary effort is normal. No respiratory distress.     Breath sounds: Normal breath sounds. No wheezing or rales.     Comments: Respirations equal and unlabored, patient able to speak in full sentences, lungs clear to auscultation bilaterally Abdominal:     General: Bowel sounds are normal. There is no distension.     Palpations: Abdomen is soft. There is no mass.     Tenderness: There is abdominal tenderness in the epigastric area. There is no guarding.     Comments: Abdomen is soft and nondistended, bowel sounds present.  Focal tenderness noted in the epigastric  region but no guarding or rebound signs, no rigidity, all other quadrants nontender.  Musculoskeletal:        General: No deformity.     Cervical back: Neck supple.  Skin:    General: Skin is warm and dry.     Capillary Refill: Capillary refill takes less than 2 seconds.  Neurological:     Mental Status: She is alert.     Coordination: Coordination normal.     Comments: Speech is clear, able to follow commands Moves extremities without ataxia,  coordination intact  Psychiatric:        Mood and Affect: Mood normal.        Behavior: Behavior normal.     ED Results / Procedures / Treatments   Labs (all labs ordered are listed, but only abnormal results are displayed) Labs Reviewed  COMPREHENSIVE METABOLIC PANEL - Abnormal; Notable for the following components:      Result Value   Potassium 3.1 (*)    Glucose, Bld 135 (*)    All other components within normal limits  CBC WITH DIFFERENTIAL/PLATELET - Abnormal; Notable for the following components:   WBC 16.6 (*)    Neutro Abs 13.4 (*)    All other components within normal limits  LIPASE, BLOOD  URINALYSIS, ROUTINE W REFLEX MICROSCOPIC  RAPID URINE DRUG SCREEN, HOSP PERFORMED  I-STAT BETA HCG BLOOD, ED ([facility], [facility], [facility] ONLY)    EKG None  Radiology No results found.  Procedures Procedures (including critical care time)  Medications Ordered in ED Medications  famotidine (PEPCID) IVPB 20 mg premix (20 mg Intravenous New Bag/Given 05/14/19 1048)  sodium chloride 0.9 % bolus 1,000 mL (0 mLs Intravenous Stopped 05/14/19 1025)  ondansetron (ZOFRAN) injection 4 mg (4 mg Intravenous Given 05/14/19 0924)  diphenhydrAMINE (BENADRYL) 50 MG/ML injection (  Given 05/14/19 0925)  LORazepam (ATIVAN) injection 1 mg (2 mg Intravenous Given 05/14/19 1048)    ED Course  I have reviewed the triage vital signs and the nursing notes.  Pertinent labs & imaging results that were available during my care of the patient were reviewed by me and considered in  my medical decision making (see chart for details).    MDM Rules/Calculators/[name]                      34 year old female arrives with nausea vomiting and epigastric pain after heavy drinking last night.  Abdomen with focal epigastric tenderness without peritoneal signs, patient actively vomiting on arrival, Zofran and IV fluids ordered.  After receiving Zofran patient became very anxious repeatedly trying to get up and walk out of the room, called by nursing staff and to evaluate the patient, she appears to be having a dystonic reaction, Benadryl given with improvement patient was able to lay down and sleep.  Vitals remained stable during this event.  Patient is now drowsy but arouses to verbal stimuli and is able to answer yes or no questions.  Lab work so far shows leukocytosis of 16.6, patient without focal abdominal exam and I suspect this is an acute reaction to vomiting.  Normal hemoglobin.  Potassium of 3.1, glucose of 135, no other electrolyte derangements.  Normal renal and liver function and normal lipase.  Patient began dry heaving again, and a little over an hour after she received the Benadryl she became agitated again trying to stand up.  I discussed case with Dr. Rubin Payor who came in to evaluate the patient, recommends giving 1 mg of Ativan, will avoid further antiemetics at this time.  EKG shows borderline prolonged QT but is otherwise unremarkable.  Because of patient's frequent episodes of agitation feel she would benefit from being moved to a higher level of care due to available nursing staff in this area.  Care signed out to Dr. Anitra Lauth in yellow zone who will assume care of patient.  After Ativan she seems improved we will continue with IV fluids and reassess, I anticipate patient will improve as she gets over this dystonic reaction, exam is otherwise benign.  Final Clinical Impression(s) / ED Diagnoses Final diagnoses:  Nausea and vomiting, intractability of vomiting not  specified, unspecified vomiting type  Epigastric abdominal pain    Rx / DC Orders ED Discharge Orders    None       Legrand Rams 05/14/19 1112    Benjiman Core, MD 05/14/19 [phone]

## 2019-05-14 NOTE — ED Provider Notes (Signed)
Assumed care of patient from green pod.  History of heavy alcohol use yesterday and woke up with nausea vomiting and abdominal discomfort.  Patient received Zofran and had a dystonic reaction.  And then she continues to vomit.  Labs with leukocytosis of 16,000, lipase, LFTs and renal function within normal limits.  Will reassess after fluids and Ativan.  Pt was having ongoing epigastric pain which improved with morphine.  She has had no further vomiting.  Drinking some sprite here without vomiting.  Labs reassuring, leukocytosis of unknown signficance.  Feel pt stable for d/c  Home.   Gwyneth Sprout, MD 05/14/19 [phone]

## 2019-05-14 NOTE — ED Notes (Signed)
Pt drowsy with continued dry heaves.  Does stay in bed at this time.

## 2019-05-14 NOTE — Discharge Instructions (Addendum)
Your liver and kidneys look normal today.  You were dehydrated from the vomiting.  Most likely this was result of the alcohol you had to drink yesterday.  You did receive a medication today for nausea that caused an abnormal reaction which is why you felt unusual.  Recommend that you no longer take this medication.  Also you were given a medicine for acid in your stomach.  You were given a prescription for nausea medicine that is different if needed.  Make sure you eat a very bland diet tonight Sprite, chicken noodle soup, rice and other bland foods.

## 2019-05-14 NOTE — ED Notes (Signed)
Pt reports sx starting at 0500 this AM,Vomiting x 6 times with no diarrhea noted.  Denies any urinary sx or vaginal DC.

## 2019-05-14 NOTE — ED Notes (Signed)
Help patient to the bathroom by wheel chair patient is now back in bed on the monitor with call bell in reach

## 2020-02-26 ENCOUNTER — Other Ambulatory Visit: Payer: Self-pay

## 2020-02-26 ENCOUNTER — Telehealth: Payer: Self-pay

## 2020-02-26 ENCOUNTER — Encounter (HOSPITAL_COMMUNITY): Payer: Self-pay | Admitting: Obstetrics and Gynecology

## 2020-02-26 ENCOUNTER — Inpatient Hospital Stay (HOSPITAL_COMMUNITY)
Admission: AD | Admit: 2020-02-26 | Discharge: 2020-02-26 | Disposition: A | Discharge status: Home / Self Care | Payer: No Typology Code available for payment source | Attending: Obstetrics and Gynecology | Admitting: Obstetrics and Gynecology

## 2020-02-26 DIAGNOSIS — Z3A09 9 weeks gestation of pregnancy: Secondary | ICD-10-CM | POA: Insufficient documentation

## 2020-02-26 DIAGNOSIS — O219 Vomiting of pregnancy, unspecified: Secondary | ICD-10-CM | POA: Insufficient documentation

## 2020-02-26 DIAGNOSIS — O26891 Other specified pregnancy related conditions, first trimester: Secondary | ICD-10-CM | POA: Diagnosis not present

## 2020-02-26 DIAGNOSIS — Z87891 Personal history of nicotine dependence: Secondary | ICD-10-CM | POA: Diagnosis not present

## 2020-02-26 DIAGNOSIS — R519 Headache, unspecified: Secondary | ICD-10-CM | POA: Diagnosis not present

## 2020-02-26 LAB — URINALYSIS, ROUTINE W REFLEX MICROSCOPIC
Bilirubin Urine: NEGATIVE
Glucose, UA: NEGATIVE mg/dL
Hgb urine dipstick: NEGATIVE
Ketones, ur: 20 mg/dL — AB
Leukocytes,Ua: NEGATIVE
Nitrite: NEGATIVE
Protein, ur: 30 mg/dL — AB
Specific Gravity, Urine: 1.03 (ref 1.005–1.030)
pH: 5 (ref 5.0–8.0)

## 2020-02-26 LAB — POCT PREGNANCY, URINE: Preg Test, Ur: POSITIVE — AB

## 2020-02-26 MED ORDER — LACTATED RINGERS IV BOLUS
1000.0000 mL | Freq: Once | INTRAVENOUS | Status: AC
  Administered 2020-02-26: 1000 mL via INTRAVENOUS

## 2020-02-26 MED ORDER — DOXYLAMINE-PYRIDOXINE 10-10 MG PO TBEC: 2.0000 | DELAYED_RELEASE_TABLET | Freq: Every day | ORAL | 2 refills | Status: DC

## 2020-02-26 MED ORDER — FAMOTIDINE IN NACL 20-0.9 MG/50ML-% IV SOLN
20.0000 mg | Freq: Once | INTRAVENOUS | Status: AC
  Administered 2020-02-26: 20 mg via INTRAVENOUS
  Filled 2020-02-26: qty 50

## 2020-02-26 MED ORDER — PROMETHAZINE HCL 25 MG PO TABS: 25.0000 mg | ORAL_TABLET | Freq: Four times a day (QID) | ORAL | 2 refills | Status: DC | PRN

## 2020-02-26 MED ORDER — PROMETHAZINE HCL 25 MG/ML IJ SOLN
12.5000 mg | Freq: Once | INTRAMUSCULAR | Status: AC
  Administered 2020-02-26: 12.5 mg via INTRAVENOUS
  Filled 2020-02-26: qty 1

## 2020-02-26 NOTE — MAU Note (Signed)
Unable to keep down anything for 3 days. LMP 01/23/2020.. My head and stomach hurts. Has first appt 12/10 at [address]. They called in some nausea medicine but I cannot keep it down. Denies VB

## 2020-02-26 NOTE — Telephone Encounter (Signed)
Patient called and left message on triage voicemail stating that she has not been able to keep anything down for 3 days. Patient has not been seen yet for New OB. Phenergan sent per protocol. Advised patient that she will be receiving future care at [facility]. Advised patient that if symptoms worsen that she will need to be evaluated for fluids. Patient verbalized understanding.

## 2020-02-26 NOTE — MAU Provider Note (Addendum)
History     CSN: [id number]  Arrival date and time: 02/26/20 9379   First Provider Initiated Contact with Patient 02/26/20 2125      Chief Complaint  Patient presents with  . Emesis During Pregnancy  . Abdominal Pain  . Headache   [facility] is a 34 y.o. G2P0010 at [redacted]w[redacted]d who plans to receive care at [facility].  She presents today for Emesis During Pregnancy.  She states she has been unable to keep anything down.  She reports "I know that I am suppose to be nauseous, but this is bad."  She states she called her office and was prescribed medication, but is unable to keep this down as well.  Patient reports she has tried to eat crackers and toast, but threw up "not more than a minute" after consumption.  Patient reports a HA and upper stomach pain and contributes this to the vomiting.     OB History    Gravida  2   Para      Term      Preterm      AB  1   Living  0     SAB  1   TAB      Ectopic      Multiple      Live Births              Past Medical History:  Diagnosis Date  . Asthma   . Hip fracture, right (HCC) 2010    Past Surgical History:  Procedure Laterality Date  . NO PAST SURGERIES      History reviewed. No pertinent family history.  Social History   Tobacco Use  . Smoking status: Former Smoker    Quit date: 04/05/2010    Years since quitting: 9.9  . Smokeless tobacco: Never Used  Vaping Use  . Vaping Use: Never used  Substance Use Topics  . Alcohol use: Yes    Alcohol/week: 0.0 standard drinks    Comment: socially  . Drug use: Yes    Frequency: 1.0 times per week    Types: Marijuana    Comment: last used when i found out i was preg.     Allergies:  Allergies  Allergen Reactions  . Reglan [Metoclopramide] Anxiety    Medications Prior to Admission  Medication Sig Dispense Refill Last Dose  . Prenatal Vit-Fe Fumarate-FA (PRENATAL MULTIVITAMIN) TABS tablet Take 1 tablet by mouth daily at 12 noon.   Past Week at Unknown time   . promethazine (PHENERGAN) 25 MG tablet Take 1 tablet (25 mg total) by mouth every 6 (six) hours as needed for nausea or vomiting. 30 tablet 2 02/26/2020 at Unknown time  . albuterol (PROVENTIL) (2.5 MG/3ML) 0.083% nebulizer solution Take 3 mLs (2.5 mg total) by nebulization every 6 (six) hours as needed for wheezing or shortness of breath. (Patient not taking: Reported on 05/14/2019) 75 mL 0   . famotidine (PEPCID) 20 MG tablet Take 1 tablet (20 mg total) by mouth 2 (two) times daily. (Patient not taking: Reported on 03/28/2019) 30 tablet 0   . omeprazole (PRILOSEC) 20 MG capsule Take 1 capsule (20 mg total) by mouth 2 (two) times daily before a meal. (Patient not taking: Reported on 03/28/2019) 60 capsule 0   . ondansetron (ZOFRAN ODT) 4 MG disintegrating tablet Take 1 tablet (4 mg total) by mouth every 8 (eight) hours as needed for nausea or vomiting. (Patient not taking: Reported on 03/28/2019) 15 tablet 0   .  prochlorperazine (COMPAZINE) 10 MG tablet Take 1 tablet (10 mg total) by mouth 2 (two) times daily as needed for nausea or vomiting. (Patient not taking: Reported on 03/28/2019) 10 tablet 0   . promethazine (PHENERGAN) 25 MG tablet Take 1 tablet (25 mg total) by mouth every 6 (six) hours as needed for nausea or vomiting. 10 tablet 0     Review of Systems  Constitutional: Negative for chills and fever.  Respiratory: Negative for cough and shortness of breath.   Gastrointestinal: Positive for nausea and vomiting. Negative for constipation and diarrhea.  Genitourinary: Negative for difficulty urinating, dysuria, vaginal bleeding and vaginal discharge.  Neurological: Positive for headaches (6/10). Negative for dizziness and light-headedness.   Physical Exam   Blood pressure 115/84, pulse (!) 107, temperature 98.4 F (36.9 C), resp. rate 16, height 5\' 5"  (1.651 m), weight 71.2 kg, last menstrual period 12/24/2019, unknown if currently breastfeeding.  Physical Exam Vitals and nursing note  reviewed. Exam conducted with a chaperone present.  Constitutional:      Appearance: Normal appearance. She is well-developed.  HENT:     Head: Normocephalic and atraumatic.  Eyes:     Conjunctiva/sclera: Conjunctivae normal.  Cardiovascular:     Rate and Rhythm: Normal rate and regular rhythm.  Pulmonary:     Effort: Pulmonary effort is normal. No respiratory distress.     Breath sounds: Normal breath sounds.  Musculoskeletal:        General: Normal range of motion.  Skin:    General: Skin is warm and dry.  Neurological:     Mental Status: She is alert and oriented to person, place, and time.  Psychiatric:        Mood and Affect: Mood normal.        Thought Content: Thought content normal.     MAU Course  Procedures Results for orders placed or performed during the hospital encounter of 02/26/20 (from the past 24 hour(s))  Pregnancy, urine POC     Status: Abnormal   Collection Time: 02/26/20  8:10 PM  Result Value Ref Range   Preg Test, Ur POSITIVE (A) NEGATIVE  Urinalysis, Routine w reflex microscopic     Status: Abnormal   Collection Time: 02/26/20  8:13 PM  Result Value Ref Range   Color, Urine YELLOW YELLOW   APPearance HAZY (A) CLEAR   Specific Gravity, Urine 1.030 1.005 - 1.030   pH 5.0 5.0 - 8.0   Glucose, UA NEGATIVE NEGATIVE mg/dL   Hgb urine dipstick NEGATIVE NEGATIVE   Bilirubin Urine NEGATIVE NEGATIVE   Ketones, ur 20 (A) NEGATIVE mg/dL   Protein, ur 30 (A) NEGATIVE mg/dL   Nitrite NEGATIVE NEGATIVE   Leukocytes,Ua NEGATIVE NEGATIVE   RBC / HPF 0-5 0 - 5 RBC/hpf   WBC, UA 0-5 0 - 5 WBC/hpf   Bacteria, UA MANY (A) NONE SEEN   Squamous Epithelial / LPF 6-10 0 - 5   Mucus PRESENT     MDM Start IV LR Bolus  Antiemetic PPI Assessment and Plan  34 year old, G2P0010  SIUP at 9.2 weeks N/V   -Reviewed POC with patient. -Exam performed and findings discussed.  -Discussed Diclegis for usage of regular medication at home for N/V mgmt.  -Will start IV  and give LR -Pepcid and Phenergan  -Will monitor and reassess with completion of fluids.    36 02/26/2020, 9:25 PM   Reassessment (11:24 PM)  -Patient reports improvement in symptoms  -Tolerating ginger ale without  issues. -Rx for Diclegis sent to pharmacy on file.  -Encouraged to call office if any issues with script. -Encouraged to call or return to MAU if symptoms worsen or with the onset of new symptoms. -Discharged to home in improved condition.  Cherre Robins MSN, CNM Advanced Practice Provider, [facility]

## 2020-02-26 NOTE — Progress Notes (Signed)
Jessica Emly CNM in earlier to discuss d/c plan. Written and verbal d/c instructions given and understanding voiced 

## 2020-02-26 NOTE — Discharge Instructions (Signed)
Hyperemesis Gravidarum Hyperemesis gravidarum is a severe form of nausea and vomiting that happens during pregnancy. Hyperemesis is worse than morning sickness. It may cause you to have nausea or vomiting all day for many days. It may keep you from eating and drinking enough food and liquids, which can lead to dehydration, malnutrition, and weight loss. Hyperemesis usually occurs during the first half (the first 20 weeks) of pregnancy. It often goes away once a woman is in her second half of pregnancy. However, sometimes hyperemesis continues through an entire pregnancy. What are the causes? The cause of this condition is not known. It may be related to changes in chemicals (hormones) in the body during pregnancy, such as the high level of pregnancy hormone (human chorionic gonadotropin) or the increase in the female sex hormone (estrogen). What are the signs or symptoms? Symptoms of this condition include:  Nausea that does not go away.  Vomiting that does not allow you to keep any food down.  Weight loss.  Body fluid loss (dehydration).  Having no desire to eat, or not liking food that you have previously enjoyed. How is this diagnosed? This condition may be diagnosed based on:  A physical exam.  Your medical history.  Your symptoms.  Blood tests.  Urine tests. How is this treated? This condition is managed by controlling symptoms. This may include:  Following an eating plan. This can help lessen nausea and vomiting.  Taking prescription medicines. An eating plan and medicines are often used together to help control symptoms. If medicines do not help relieve nausea and vomiting, you may need to receive fluids through an IV at the hospital. Follow these instructions at home: Eating and drinking   Avoid the following: ? Drinking fluids with meals. Try not to drink anything during the 30 minutes before and after your meals. ? Drinking more than 1 cup of fluid at a  time. ? Eating foods that trigger your symptoms. These may include spicy foods, coffee, high-fat foods, very sweet foods, and acidic foods. ? Skipping meals. Nausea can be more intense on an empty stomach. If you cannot tolerate food, do not force it. Try sucking on ice chips or other frozen items and make up for missed calories later. ? Lying down within 2 hours after eating. ? Being exposed to environmental triggers. These may include food smells, smoky rooms, closed spaces, rooms with strong smells, warm or humid places, overly loud and noisy rooms, and rooms with motion or flickering lights. Try eating meals in a well-ventilated area that is free of strong smells. ? Quick and sudden changes in your movement. ? Taking iron pills and multivitamins that contain iron. If you take prescription iron pills, do not stop taking them unless your health care provider approves. ? Preparing food. The smell of food can spoil your appetite or trigger nausea.  To help relieve your symptoms: ? Listen to your body. Everyone is different and has different preferences. Find what works best for you. ? Eat and drink slowly. ? Eat 5-6 small meals daily instead of 3 large meals. Eating small meals and snacks can help you avoid an empty stomach. ? In the morning, before getting out of bed, eat a couple of crackers to avoid moving around on an empty stomach. ? Try eating starchy foods as these are usually tolerated well. Examples include cereal, toast, bread, potatoes, pasta, rice, and pretzels. ? Include at least 1 serving of protein with your meals and snacks. Protein options include   lean meats, poultry, seafood, beans, nuts, nut butters, eggs, cheese, and yogurt. ? Try eating a protein-rich snack before bed. Examples of a protein-rick snack include cheese and crackers or a peanut butter sandwich made with 1 slice of whole-wheat bread and 1 tsp (5 g) of peanut butter. ? Eat or suck on things that have ginger in them.  It may help relieve nausea. Add  tsp ground ginger to hot tea or choose ginger tea. ? Try drinking 100% fruit juice or an electrolyte drink. An electrolyte drink contains sodium, potassium, and chloride. ? Drink fluids that are cold, clear, and carbonated or sour. Examples include lemonade, ginger ale, lemon-lime soda, ice water, and sparkling water. ? Brush your teeth or use a mouth rinse after meals. ? Talk with your health care provider about starting a supplement of vitamin B6. General instructions  Take over-the-counter and prescription medicines only as told by your health care provider.  Follow instructions from your health care provider about eating or drinking restrictions.  Continue to take your prenatal vitamins as told by your health care provider. If you are having trouble taking your prenatal vitamins, talk with your health care provider about different options.  Keep all follow-up and pre-birth (prenatal) visits as told by your health care provider. This is important. Contact a health care provider if:  You have pain in your abdomen.  You have a severe headache.  You have vision problems.  You are losing weight.  You feel weak or dizzy. Get help right away if:  You cannot drink fluids without vomiting.  You vomit blood.  You have constant nausea and vomiting.  You are very weak.  You faint.  You have a fever and your symptoms suddenly get worse. Summary  Hyperemesis gravidarum is a severe form of nausea and vomiting that happens during pregnancy.  Making some changes to your eating habits may help relieve nausea and vomiting.  This condition may be managed with medicine.  If medicines do not help relieve nausea and vomiting, you may need to receive fluids through an IV at the hospital. This information is not intended to replace advice given to you by your health care provider. Make sure you discuss any questions you have with your health care  provider. Document Revised: 04/11/2017 Document Reviewed: 11/19/2015 [employer] Patient Education  2020 [employer]  

## 2020-02-26 NOTE — Progress Notes (Signed)
Jessica [facility] CNM aware flds are infused and pt feeling better and tol flds.

## 2020-02-28 LAB — URINE CULTURE: Culture: <10000 — AB

## 2020-03-05 ENCOUNTER — Encounter: Payer: Self-pay | Admitting: *Deleted

## 2020-03-11 ENCOUNTER — Other Ambulatory Visit: Payer: Self-pay

## 2020-03-11 ENCOUNTER — Encounter: Payer: Self-pay | Admitting: Family Medicine

## 2020-03-11 ENCOUNTER — Other Ambulatory Visit (HOSPITAL_COMMUNITY)
Admission: RE | Admit: 2020-03-11 | Discharge: 2020-03-11 | Disposition: A | Discharge status: Home / Self Care | Payer: Medicaid Other | Source: Ambulatory Visit | Attending: Family Medicine | Admitting: Family Medicine

## 2020-03-11 ENCOUNTER — Encounter: Payer: No Typology Code available for payment source | Admitting: Family Medicine

## 2020-03-11 ENCOUNTER — Ambulatory Visit (INDEPENDENT_AMBULATORY_CARE_PROVIDER_SITE_OTHER): Payer: No Typology Code available for payment source | Admitting: Family Medicine

## 2020-03-11 VITALS — BP 133/85 | HR 83 | Wt 162.0 lb

## 2020-03-11 DIAGNOSIS — Z3491 Encounter for supervision of normal pregnancy, unspecified, first trimester: Secondary | ICD-10-CM | POA: Diagnosis present

## 2020-03-11 LAB — POCT URINALYSIS DIP (DEVICE)
Bilirubin Urine: NEGATIVE
Glucose, UA: NEGATIVE mg/dL
Ketones, ur: NEGATIVE mg/dL
Leukocytes,Ua: NEGATIVE
Nitrite: NEGATIVE
Protein, ur: NEGATIVE mg/dL
Specific Gravity, Urine: >=1.03 (ref 1.005–1.030)
Urobilinogen, UA: 0.2 mg/dL (ref 0.0–1.0)
pH: 5.5 (ref 5.0–8.0)

## 2020-03-11 NOTE — Patient Instructions (Signed)
 First Trimester of Pregnancy The first trimester of pregnancy is from week 1 until the end of week 13 (months 1 through 3). A week after a sperm fertilizes an egg, the egg will implant on the wall of the uterus. This embryo will begin to develop into a baby. Genes from you and your partner will form the baby. The female genes will determine whether the baby will be a boy or a girl. At 6-8 weeks, the eyes and face will be formed, and the heartbeat can be seen on ultrasound. At the end of 12 weeks, all the baby's organs will be formed. Now that you are pregnant, you will want to do everything you can to have a healthy baby. Two of the most important things are to get good prenatal care and to follow your health care provider's instructions. Prenatal care is all the medical care you receive before the baby's birth. This care will help prevent, find, and treat any problems during the pregnancy and childbirth. Body changes during your first trimester Your body goes through many changes during pregnancy. The changes vary from woman to woman.  You may gain or lose a couple of pounds at first.  You may feel sick to your stomach (nauseous) and you may throw up (vomit). If the vomiting is uncontrollable, call your health care provider.  You may tire easily.  You may develop headaches that can be relieved by medicines. All medicines should be approved by your health care provider.  You may urinate more often. Painful urination may mean you have a bladder infection.  You may develop heartburn as a result of your pregnancy.  You may develop constipation because certain hormones are causing the muscles that push stool through your intestines to slow down.  You may develop hemorrhoids or swollen veins (varicose veins).  Your breasts may begin to grow larger and become tender. Your nipples may stick out more, and the tissue that surrounds them (areola) may become darker.  Your gums may bleed and may be  sensitive to brushing and flossing.  Dark spots or blotches (chloasma, mask of pregnancy) may develop on your face. This will likely fade after the baby is born.  Your menstrual periods will stop.  You may have a loss of appetite.  You may develop cravings for certain kinds of food.  You may have changes in your emotions from day to day, such as being excited to be pregnant or being concerned that something may go wrong with the pregnancy and baby.  You may have more vivid and strange dreams.  You may have changes in your hair. These can include thickening of your hair, rapid growth, and changes in texture. Some women also have hair loss during or after pregnancy, or hair that feels dry or thin. Your hair will most likely return to normal after your baby is born. What to expect at prenatal visits During a routine prenatal visit:  You will be weighed to make sure you and the baby are growing normally.  Your blood pressure will be taken.  Your abdomen will be measured to track your baby's growth.  The fetal heartbeat will be listened to between weeks 10 and 14 of your pregnancy.  Test results from any previous visits will be discussed. Your health care provider may ask you:  How you are feeling.  If you are feeling the baby move.  If you have had any abnormal symptoms, such as leaking fluid, bleeding, severe headaches, or   abdominal cramping.  If you are using any tobacco products, including cigarettes, chewing tobacco, and electronic cigarettes.  If you have any questions. Other tests that may be performed during your first trimester include:  Blood tests to find your blood type and to check for the presence of any previous infections. The tests will also be used to check for low iron levels (anemia) and protein on red blood cells (Rh antibodies). Depending on your risk factors, or if you previously had diabetes during pregnancy, you may have tests to check for high blood sugar  that affects pregnant women (gestational diabetes).  Urine tests to check for infections, diabetes, or protein in the urine.  An ultrasound to confirm the proper growth and development of the baby.  Fetal screens for spinal cord problems (spina bifida) and Down syndrome.  HIV (human immunodeficiency virus) testing. Routine prenatal testing includes screening for HIV, unless you choose not to have this test.  You may need other tests to make sure you and the baby are doing well. Follow these instructions at home: Medicines  Follow your health care provider's instructions regarding medicine use. Specific medicines may be either safe or unsafe to take during pregnancy.  Take a prenatal vitamin that contains at least 600 micrograms (mcg) of folic acid.  If you develop constipation, try taking a stool softener if your health care provider approves. Eating and drinking   Eat a balanced diet that includes fresh fruits and vegetables, whole grains, good sources of protein such as meat, eggs, or tofu, and low-fat dairy. Your health care provider will help you determine the amount of weight gain that is right for you.  Avoid raw meat and uncooked cheese. These carry germs that can cause birth defects in the baby.  Eating four or five small meals rather than three large meals a day may help relieve nausea and vomiting. If you start to feel nauseous, eating a few soda crackers can be helpful. Drinking liquids between meals, instead of during meals, also seems to help ease nausea and vomiting.  Limit foods that are high in fat and processed sugars, such as fried and sweet foods.  To prevent constipation: ? Eat foods that are high in fiber, such as fresh fruits and vegetables, whole grains, and beans. ? Drink enough fluid to keep your urine clear or pale yellow. Activity  Exercise only as directed by your health care provider. Most women can continue their usual exercise routine during  pregnancy. Try to exercise for 30 minutes at least 5 days a week. Exercising will help you: ? Control your weight. ? Stay in shape. ? Be prepared for labor and delivery.  Experiencing pain or cramping in the lower abdomen or lower back is a good sign that you should stop exercising. Check with your health care provider before continuing with normal exercises.  Try to avoid standing for long periods of time. Move your legs often if you must stand in one place for a long time.  Avoid heavy lifting.  Wear low-heeled shoes and practice good posture.  You may continue to have sex unless your health care provider tells you not to. Relieving pain and discomfort  Wear a good support bra to relieve breast tenderness.  Take warm sitz baths to soothe any pain or discomfort caused by hemorrhoids. Use hemorrhoid cream if your health care provider approves.  Rest with your legs elevated if you have leg cramps or low back pain.  If you develop varicose veins   in your legs, wear support hose. Elevate your feet for 15 minutes, 3-4 times a day. Limit salt in your diet. Prenatal care  Schedule your prenatal visits by the twelfth week of pregnancy. They are usually scheduled monthly at first, then more often in the last 2 months before delivery.  Write down your questions. Take them to your prenatal visits.  Keep all your prenatal visits as told by your health care provider. This is important. Safety  Wear your seat belt at all times when driving.  Make a list of emergency phone numbers, including numbers for family, friends, the hospital, and police and fire departments. General instructions  Ask your health care provider for a referral to a local prenatal education class. Begin classes no later than the beginning of month 6 of your pregnancy.  Ask for help if you have counseling or nutritional needs during pregnancy. Your health care provider can offer advice or refer you to specialists for help  with various needs.  Do not use hot tubs, steam rooms, or saunas.  Do not douche or use tampons or scented sanitary pads.  Do not cross your legs for long periods of time.  Avoid cat litter boxes and soil used by cats. These carry germs that can cause birth defects in the baby and possibly loss of the fetus by miscarriage or stillbirth.  Avoid all smoking, herbs, alcohol, and medicines not prescribed by your health care provider. Chemicals in these products affect the formation and growth of the baby.  Do not use any products that contain nicotine or tobacco, such as cigarettes and e-cigarettes. If you need help quitting, ask your health care provider. You may receive counseling support and other resources to help you quit.  Schedule a dentist appointment. At home, brush your teeth with a soft toothbrush and be gentle when you floss. Contact a health care provider if:  You have dizziness.  You have mild pelvic cramps, pelvic pressure, or nagging pain in the abdominal area.  You have persistent nausea, vomiting, or diarrhea.  You have a bad smelling vaginal discharge.  You have pain when you urinate.  You notice increased swelling in your face, hands, legs, or ankles.  You are exposed to fifth disease or chickenpox.  You are exposed to [place] measles (rubella) and have never had it. Get help right away if:  You have a fever.  You are leaking fluid from your vagina.  You have spotting or bleeding from your vagina.  You have severe abdominal cramping or pain.  You have rapid weight gain or loss.  You vomit blood or material that looks like coffee grounds.  You develop a severe headache.  You have shortness of breath.  You have any kind of trauma, such as from a fall or a car accident. Summary  The first trimester of pregnancy is from week 1 until the end of week 13 (months 1 through 3).  Your body goes through many changes during pregnancy. The changes vary from  woman to woman.  You will have routine prenatal visits. During those visits, your health care provider will examine you, discuss any test results you may have, and talk with you about how you are feeling. This information is not intended to replace advice given to you by your health care provider. Make sure you discuss any questions you have with your health care provider. Document Revised: 03/04/2017 Document Reviewed: 03/03/2016 Elsevier Patient Education  2020 [employer].   Contraception Choices Contraception,  also called birth control, refers to methods or devices that prevent pregnancy. Hormonal methods Contraceptive implant  A contraceptive implant is a thin, plastic tube that contains a hormone. It is inserted into the upper part of the arm. It can remain in place for up to 3 years. Progestin-only injections Progestin-only injections are injections of progestin, a synthetic form of the hormone progesterone. They are given every 3 months by a health care provider. Birth control pills  Birth control pills are pills that contain hormones that prevent pregnancy. They must be taken once a day, preferably at the same time each day. Birth control patch  The birth control patch contains hormones that prevent pregnancy. It is placed on the skin and must be changed once a week for three weeks and removed on the fourth week. A prescription is needed to use this method of contraception. Vaginal ring  A vaginal ring contains hormones that prevent pregnancy. It is placed in the vagina for three weeks and removed on the fourth week. After that, the process is repeated with a new ring. A prescription is needed to use this method of contraception. Emergency contraceptive Emergency contraceptives prevent pregnancy after unprotected sex. They come in pill form and can be taken up to 5 days after sex. They work best the sooner they are taken after having sex. Most emergency contraceptives are  available without a prescription. This method should not be used as your only form of birth control. Barrier methods Female condom  A female condom is a thin sheath that is worn over the penis during sex. Condoms keep sperm from going inside a woman's body. They can be used with a spermicide to increase their effectiveness. They should be disposed after a single use. Female condom  A female condom is a soft, loose-fitting sheath that is put into the vagina before sex. The condom keeps sperm from going inside a woman's body. They should be disposed after a single use. Diaphragm  A diaphragm is a soft, dome-shaped barrier. It is inserted into the vagina before sex, along with a spermicide. The diaphragm blocks sperm from entering the uterus, and the spermicide kills sperm. A diaphragm should be left in the vagina for 6-8 hours after sex and removed within 24 hours. A diaphragm is prescribed and fitted by a health care provider. A diaphragm should be replaced every 1-2 years, after giving birth, after gaining more than 15 lb (6.8 kg), and after pelvic surgery. Cervical cap  A cervical cap is a round, soft latex or plastic cup that fits over the cervix. It is inserted into the vagina before sex, along with spermicide. It blocks sperm from entering the uterus. The cap should be left in place for 6-8 hours after sex and removed within 48 hours. A cervical cap must be prescribed and fitted by a health care provider. It should be replaced every 2 years. Sponge  A sponge is a soft, circular piece of polyurethane foam with spermicide on it. The sponge helps block sperm from entering the uterus, and the spermicide kills sperm. To use it, you make it wet and then insert it into the vagina. It should be inserted before sex, left in for at least 6 hours after sex, and removed and thrown away within 30 hours. Spermicides Spermicides are chemicals that kill or block sperm from entering the cervix and uterus. They can  come as a cream, jelly, suppository, foam, or tablet. A spermicide should be inserted into the vagina with  an applicator at least 10-15 minutes before sex to allow time for it to work. The process must be repeated every time you have sex. Spermicides do not require a prescription. Intrauterine contraception Intrauterine device (IUD) An IUD is a T-shaped device that is put in a woman's uterus. There are two types:  Hormone IUD.This type contains progestin, a synthetic form of the hormone progesterone. This type can stay in place for 3-5 years.  Copper IUD.This type is wrapped in copper wire. It can stay in place for 10 years.  Permanent methods of contraception Female tubal ligation In this method, a woman's fallopian tubes are sealed, tied, or blocked during surgery to prevent eggs from traveling to the uterus. Hysteroscopic sterilization In this method, a small, flexible insert is placed into each fallopian tube. The inserts cause scar tissue to form in the fallopian tubes and block them, so sperm cannot reach an egg. The procedure takes about 3 months to be effective. Another form of birth control must be used during those 3 months. Female sterilization This is a procedure to tie off the tubes that carry sperm (vasectomy). After the procedure, the man can still ejaculate fluid (semen). Natural planning methods Natural family planning In this method, a couple does not have sex on days when the woman could become pregnant. Calendar method This means keeping track of the length of each menstrual cycle, identifying the days when pregnancy can happen, and not having sex on those days. Ovulation method In this method, a couple avoids sex during ovulation. Symptothermal method This method involves not having sex during ovulation. The woman typically checks for ovulation by watching changes in her temperature and in the consistency of cervical mucus. Post-ovulation method In this method, a couple  waits to have sex until after ovulation. Summary  Contraception, also called birth control, means methods or devices that prevent pregnancy.  Hormonal methods of contraception include implants, injections, pills, patches, vaginal rings, and emergency contraceptives.  Barrier methods of contraception can include female condoms, female condoms, diaphragms, cervical caps, sponges, and spermicides.  There are two types of IUDs (intrauterine devices). An IUD can be put in a woman's uterus to prevent pregnancy for 3-5 years.  Permanent sterilization can be done through a procedure for males, females, or both.  Natural family planning methods involve not having sex on days when the woman could become pregnant. This information is not intended to replace advice given to you by your health care provider. Make sure you discuss any questions you have with your health care provider. Document Revised: 03/24/2017 Document Reviewed: 04/24/2016 Elsevier Patient Education  2020 [employer].   Breastfeeding  Choosing to breastfeed is one of the best decisions you can make for yourself and your baby. A change in hormones during pregnancy causes your breasts to make breast milk in your milk-producing glands. Hormones prevent breast milk from being released before your baby is born. They also prompt milk flow after birth. Once breastfeeding has begun, thoughts of your baby, as well as his or her sucking or crying, can stimulate the release of milk from your milk-producing glands. Benefits of breastfeeding Research shows that breastfeeding offers many health benefits for infants and mothers. It also offers a cost-free and convenient way to feed your baby. For your baby  Your first milk (colostrum) helps your baby's digestive system to function better.  Special cells in your milk (antibodies) help your baby to fight off infections.  Breastfed babies are less likely to develop  asthma, allergies, obesity, or  type 2 diabetes. They are also at lower risk for sudden infant death syndrome (SIDS).  Nutrients in breast milk are better able to meet your baby's needs compared to infant formula.  Breast milk improves your baby's brain development. For you  Breastfeeding helps to create a very special bond between you and your baby.  Breastfeeding is convenient. Breast milk costs nothing and is always available at the correct temperature.  Breastfeeding helps to burn calories. It helps you to lose the weight that you gained during pregnancy.  Breastfeeding makes your uterus return faster to its size before pregnancy. It also slows bleeding (lochia) after you give birth.  Breastfeeding helps to lower your risk of developing type 2 diabetes, osteoporosis, rheumatoid arthritis, cardiovascular disease, and breast, ovarian, uterine, and endometrial cancer later in life. Breastfeeding basics Starting breastfeeding  Find a comfortable place to sit or lie down, with your neck and back well-supported.  Place a pillow or a rolled-up blanket under your baby to bring him or her to the level of your breast (if you are seated). Nursing pillows are specially designed to help support your arms and your baby while you breastfeed.  Make sure that your baby's tummy (abdomen) is facing your abdomen.  Gently massage your breast. With your fingertips, massage from the outer edges of your breast inward toward the nipple. This encourages milk flow. If your milk flows slowly, you may need to continue this action during the feeding.  Support your breast with 4 fingers underneath and your thumb above your nipple (make the letter "C" with your hand). Make sure your fingers are well away from your nipple and your baby's mouth.  Stroke your baby's lips gently with your finger or nipple.  When your baby's mouth is open wide enough, quickly bring your baby to your breast, placing your entire nipple and as much of the areola as  possible into your baby's mouth. The areola is the colored area around your nipple. ? More areola should be visible above your baby's upper lip than below the lower lip. ? Your baby's lips should be opened and extended outward (flanged) to ensure an adequate, comfortable latch. ? Your baby's tongue should be between his or her lower gum and your breast.  Make sure that your baby's mouth is correctly positioned around your nipple (latched). Your baby's lips should create a seal on your breast and be turned out (everted).  It is common for your baby to suck about 2-3 minutes in order to start the flow of breast milk. Latching Teaching your baby how to latch onto your breast properly is very important. An improper latch can cause nipple pain, decreased milk supply, and poor weight gain in your baby. Also, if your baby is not latched onto your nipple properly, he or she may swallow some air during feeding. This can make your baby fussy. Burping your baby when you switch breasts during the feeding can help to get rid of the air. However, teaching your baby to latch on properly is still the best way to prevent fussiness from swallowing air while breastfeeding. Signs that your baby has successfully latched onto your nipple  Silent tugging or silent sucking, without causing you pain. Infant's lips should be extended outward (flanged).  Swallowing heard between every 3-4 sucks once your milk has started to flow (after your let-down milk reflex occurs).  Muscle movement above and in front of his or her ears while sucking. Signs  that your baby has not successfully latched onto your nipple  Sucking sounds or smacking sounds from your baby while breastfeeding.  Nipple pain. If you think your baby has not latched on correctly, slip your finger into the corner of your baby's mouth to break the suction and place it between your baby's gums. Attempt to start breastfeeding again. Signs of successful  breastfeeding Signs from your baby  Your baby will gradually decrease the number of sucks or will completely stop sucking.  Your baby will fall asleep.  Your baby's body will relax.  Your baby will retain a small amount of milk in his or her mouth.  Your baby will let go of your breast by himself or herself. Signs from you  Breasts that have increased in firmness, weight, and size 1-3 hours after feeding.  Breasts that are softer immediately after breastfeeding.  Increased milk volume, as well as a change in milk consistency and color by the fifth day of breastfeeding.  Nipples that are not sore, cracked, or bleeding. Signs that your baby is getting enough milk  Wetting at least 1-2 diapers during the first 24 hours after birth.  Wetting at least 5-6 diapers every 24 hours for the first week after birth. The urine should be clear or pale yellow by the age of 5 days.  Wetting 6-8 diapers every 24 hours as your baby continues to grow and develop.  At least 3 stools in a 24-hour period by the age of 5 days. The stool should be soft and yellow.  At least 3 stools in a 24-hour period by the age of 7 days. The stool should be seedy and yellow.  No loss of weight greater than 10% of birth weight during the first 3 days of life.  Average weight gain of 4-7 oz (113-198 g) per week after the age of 4 days.  Consistent daily weight gain by the age of 5 days, without weight loss after the age of 2 weeks. After a feeding, your baby may spit up a small amount of milk. This is normal. Breastfeeding frequency and duration Frequent feeding will help you make more milk and can prevent sore nipples and extremely full breasts (breast engorgement). Breastfeed when you feel the need to reduce the fullness of your breasts or when your baby shows signs of hunger. This is called "breastfeeding on demand." Signs that your baby is hungry include:  Increased alertness, activity, or  restlessness.  Movement of the head from side to side.  Opening of the mouth when the corner of the mouth or cheek is stroked (rooting).  Increased sucking sounds, smacking lips, cooing, sighing, or squeaking.  Hand-to-mouth movements and sucking on fingers or hands.  Fussing or crying. Avoid introducing a pacifier to your baby in the first 4-6 weeks after your baby is born. After this time, you may choose to use a pacifier. Research has shown that pacifier use during the first year of a baby's life decreases the risk of sudden infant death syndrome (SIDS). Allow your baby to feed on each breast as long as he or she wants. When your baby unlatches or falls asleep while feeding from the first breast, offer the second breast. Because newborns are often sleepy in the first few weeks of life, you may need to awaken your baby to get him or her to feed. Breastfeeding times will vary from baby to baby. However, the following rules can serve as a guide to help you make sure  that your baby is properly fed:  Newborns (babies 30 weeks of age or younger) may breastfeed every 1-3 hours.  Newborns should not go without breastfeeding for longer than 3 hours during the day or 5 hours during the night.  You should breastfeed your baby a minimum of 8 times in a 24-hour period. Breast milk pumping     Pumping and storing breast milk allows you to make sure that your baby is exclusively fed your breast milk, even at times when you are unable to breastfeed. This is especially important if you go back to work while you are still breastfeeding, or if you are not able to be present during feedings. Your lactation consultant can help you find a method of pumping that works best for you and give you guidelines about how long it is safe to store breast milk. Caring for your breasts while you breastfeed Nipples can become dry, cracked, and sore while breastfeeding. The following recommendations can help keep your  breasts moisturized and healthy:  Avoid using soap on your nipples.  Wear a supportive bra designed especially for nursing. Avoid wearing underwire-style bras or extremely tight bras (sports bras).  Air-dry your nipples for 3-4 minutes after each feeding.  Use only cotton bra pads to absorb leaked breast milk. Leaking of breast milk between feedings is normal.  Use lanolin on your nipples after breastfeeding. Lanolin helps to maintain your skin's normal moisture barrier. Pure lanolin is not harmful (not toxic) to your baby. You may also hand express a few drops of breast milk and gently massage that milk into your nipples and allow the milk to air-dry. In the first few weeks after giving birth, some women experience breast engorgement. Engorgement can make your breasts feel heavy, warm, and tender to the touch. Engorgement peaks within 3-5 days after you give birth. The following recommendations can help to ease engorgement:  Completely empty your breasts while breastfeeding or pumping. You may want to start by applying warm, moist heat (in the shower or with warm, water-soaked hand towels) just before feeding or pumping. This increases circulation and helps the milk flow. If your baby does not completely empty your breasts while breastfeeding, pump any extra milk after he or she is finished.  Apply ice packs to your breasts immediately after breastfeeding or pumping, unless this is too uncomfortable for you. To do this: ? Put ice in a plastic bag. ? Place a towel between your skin and the bag. ? Leave the ice on for 20 minutes, 2-3 times a day.  Make sure that your baby is latched on and positioned properly while breastfeeding. If engorgement persists after 48 hours of following these recommendations, contact your health care provider or a Advertising copywriter. Overall health care recommendations while breastfeeding  Eat 3 healthy meals and 3 snacks every day. Well-nourished mothers who are  breastfeeding need an additional 450-500 calories a day. You can meet this requirement by increasing the amount of a balanced diet that you eat.  Drink enough water to keep your urine pale yellow or clear.  Rest often, relax, and continue to take your prenatal vitamins to prevent fatigue, stress, and low vitamin and mineral levels in your body (nutrient deficiencies).  Do not use any products that contain nicotine or tobacco, such as cigarettes and e-cigarettes. Your baby may be harmed by chemicals from cigarettes that pass into breast milk and exposure to secondhand smoke. If you need help quitting, ask your health care provider.  Avoid alcohol.  Do not use illegal drugs or marijuana.  Talk with your health care provider before taking any medicines. These include over-the-counter and prescription medicines as well as vitamins and herbal supplements. Some medicines that may be harmful to your baby can pass through breast milk.  It is possible to become pregnant while breastfeeding. If birth control is desired, ask your health care provider about options that will be safe while breastfeeding your baby. Where to find more information: [employer]: www.llli.org Contact a health care provider if:  You feel like you want to stop breastfeeding or have become frustrated with breastfeeding.  Your nipples are cracked or bleeding.  Your breasts are red, tender, or warm.  You have: ? Painful breasts or nipples. ? A swollen area on either breast. ? A fever or chills. ? Nausea or vomiting. ? Drainage other than breast milk from your nipples.  Your breasts do not become full before feedings by the fifth day after you give birth.  You feel sad and depressed.  Your baby is: ? Too sleepy to eat well. ? Having trouble sleeping. ? More than 69 week old and wetting fewer than 6 diapers in a 24-hour period. ? Not gaining weight by 69 days of age.  Your baby has fewer than 3  stools in a 24-hour period.  Your baby's skin or the white parts of his or her eyes become yellow. Get help right away if:  Your baby is overly tired (lethargic) and does not want to wake up and feed.  Your baby develops an unexplained fever. Summary  Breastfeeding offers many health benefits for infant and mothers.  Try to breastfeed your infant when he or she shows early signs of hunger.  Gently tickle or stroke your baby's lips with your finger or nipple to allow the baby to open his or her mouth. Bring the baby to your breast. Make sure that much of the areola is in your baby's mouth. Offer one side and burp the baby before you offer the other side.  Talk with your health care provider or lactation consultant if you have questions or you face problems as you breastfeed. This information is not intended to replace advice given to you by your health care provider. Make sure you discuss any questions you have with your health care provider. Document Revised: 06/16/2017 Document Reviewed: 04/23/2016 Elsevier Patient Education  2020 [employer].

## 2020-03-11 NOTE — Progress Notes (Signed)
   Subjective:  Brittney Khan is a 34 y.o. G2P0010 at [redacted]w[redacted]d being seen today for ongoing prenatal care.  She is currently monitored for the following issues for this low-risk pregnancy and has Nausea vomiting and diarrhea; Infectious colitis; Left knee pain; Pregnancy of unknown anatomic location; Nausea/vomiting in pregnancy; Threatened miscarriage in early pregnancy; Vaginal bleeding affecting early pregnancy; and Supervision of low-risk pregnancy, first trimester on their problem list.  Patient reports no complaints.  Contractions: Not present. Vag. Bleeding: None.  Movement: Absent. Denies leaking of fluid.   The following portions of the patient's history were reviewed and updated as appropriate: allergies, current medications, past family history, past medical history, past social history, past surgical history and problem list. Problem list updated.  Objective:   Vitals:   03/11/20 1402  BP: 133/85  Pulse: 83  Weight: 162 lb (73.5 kg)    Fetal Status: Fetal Heart Rate (bpm): 156   Movement: Absent     General:  Alert, oriented and cooperative. Patient is in no acute distress.  Skin: Skin is warm and dry. No rash noted.   Cardiovascular: Normal heart rate noted  Respiratory: Normal respiratory effort, no problems with respiration noted  Abdomen: Soft, gravid, appropriate for gestational age. Pain/Pressure: Absent     Pelvic: Vag. Bleeding: None     SSE: normal vaginal mucosa and cervix        Extremities: Normal range of motion.  Edema: None  Mental Status: Normal mood and affect. Normal behavior. Normal judgment and thought content.   Urinalysis:      Assessment and Plan:  Pregnancy: G2P0010 at [redacted]w[redacted]d  1. Supervision of low-risk pregnancy, first trimester Reviewed nature of practice to include multiple providers including physicians, fellows, CNM, NP, PA, residents, students Accepts genetic screening Pap collected Routine OB labs collected - CBC/D/Plt+RPR+Rh+ABO+Rub  Ab... - [place] MFM OB DETAIL +14 WK; Future - Genetic Screening - Hemoglobin A1c - Cytology - PAP( [place]) - [facility] AMB BABYSCRIPTS SCHEDULE OPTIMIZATION  Preterm labor symptoms and general obstetric precautions including but not limited to vaginal bleeding, contractions, leaking of fluid and fetal movement were reviewed in detail with the patient. Please refer to After Visit Summary for other counseling recommendations.  Return in 4 weeks (on 04/08/2020) for ob visit.   Venora Maples, MD

## 2020-03-12 LAB — HCV INTERPRETATION

## 2020-03-12 LAB — CBC/D/PLT+RPR+RH+ABO+RUB AB...
Antibody Screen: NEGATIVE
Basophils Absolute: 0.1 10*3/uL (ref 0.0–0.2)
Basos: 0 %
EOS (ABSOLUTE): 0 10*3/uL (ref 0.0–0.4)
Eos: 0 %
HCV Ab: <0.1 s/co ratio (ref 0.0–0.9)
HIV Screen 4th Generation wRfx: NONREACTIVE
Hematocrit: 37.2 % (ref 34.0–46.6)
Hemoglobin: 13 g/dL (ref 11.1–15.9)
Hepatitis B Surface Ag: NEGATIVE
Immature Grans (Abs): 0 10*3/uL (ref 0.0–0.1)
Immature Granulocytes: 0 %
Lymphocytes Absolute: 1.7 10*3/uL (ref 0.7–3.1)
Lymphs: 12 %
MCH: 31.4 pg (ref 26.6–33.0)
MCHC: 34.9 g/dL (ref 31.5–35.7)
MCV: 90 fL (ref 79–97)
Monocytes Absolute: 0.8 10*3/uL (ref 0.1–0.9)
Monocytes: 6 %
Neutrophils Absolute: 11.4 10*3/uL — ABNORMAL HIGH (ref 1.4–7.0)
Neutrophils: 82 %
Platelets: 326 10*3/uL (ref 150–450)
RBC: 4.14 x10E6/uL (ref 3.77–5.28)
RDW: 12.2 % (ref 11.7–15.4)
RPR Ser Ql: NONREACTIVE
Rh Factor: NEGATIVE
Rubella Antibodies, IGG: 2.47 index (ref >0.99)
WBC: 13.9 10*3/uL — ABNORMAL HIGH (ref 3.4–10.8)

## 2020-03-12 LAB — HEMOGLOBIN A1C
Est. average glucose Bld gHb Est-mCnc: 111 mg/dL
Hgb A1c MFr Bld: 5.5 % (ref 4.8–5.6)

## 2020-03-13 ENCOUNTER — Encounter: Payer: Self-pay | Admitting: Family Medicine

## 2020-03-13 DIAGNOSIS — Z6791 Unspecified blood type, Rh negative: Secondary | ICD-10-CM | POA: Insufficient documentation

## 2020-03-13 DIAGNOSIS — O26899 Other specified pregnancy related conditions, unspecified trimester: Secondary | ICD-10-CM | POA: Insufficient documentation

## 2020-03-14 LAB — CYTOLOGY - PAP
Chlamydia: NEGATIVE
Comment: NEGATIVE
Comment: NEGATIVE
Comment: NORMAL
Diagnosis: NEGATIVE
Neisseria Gonorrhea: NEGATIVE
Trichomonas: NEGATIVE

## 2020-03-20 ENCOUNTER — Encounter: Payer: Self-pay | Admitting: *Deleted

## 2020-03-25 ENCOUNTER — Telehealth: Payer: Self-pay | Admitting: *Deleted

## 2020-03-25 NOTE — Telephone Encounter (Signed)
Pt left message stating that she has pain in her stomach close to her belly button. She requests a call back

## 2020-03-26 NOTE — Telephone Encounter (Signed)
I called An and left a message I am returning her call and if she is still having an issue- please call our office or send a MyChart message. Ahaana Rochette,RN

## 2020-03-27 ENCOUNTER — Other Ambulatory Visit: Payer: Self-pay

## 2020-03-27 ENCOUNTER — Emergency Department (HOSPITAL_COMMUNITY)
Admission: AD | Admit: 2020-03-27 | Discharge: 2020-03-28 | Disposition: A | Discharge status: Home / Self Care | Payer: No Typology Code available for payment source | Attending: Obstetrics & Gynecology | Admitting: Obstetrics & Gynecology

## 2020-03-27 DIAGNOSIS — W01198A Fall on same level from slipping, tripping and stumbling with subsequent striking against other object, initial encounter: Secondary | ICD-10-CM | POA: Diagnosis not present

## 2020-03-27 DIAGNOSIS — O99891 Other specified diseases and conditions complicating pregnancy: Secondary | ICD-10-CM

## 2020-03-27 DIAGNOSIS — R519 Headache, unspecified: Secondary | ICD-10-CM

## 2020-03-27 DIAGNOSIS — G44311 Acute post-traumatic headache, intractable: Secondary | ICD-10-CM

## 2020-03-27 DIAGNOSIS — Z3A13 13 weeks gestation of pregnancy: Secondary | ICD-10-CM | POA: Insufficient documentation

## 2020-03-27 DIAGNOSIS — J45909 Unspecified asthma, uncomplicated: Secondary | ICD-10-CM | POA: Insufficient documentation

## 2020-03-27 DIAGNOSIS — Z3491 Encounter for supervision of normal pregnancy, unspecified, first trimester: Secondary | ICD-10-CM

## 2020-03-27 DIAGNOSIS — O26891 Other specified pregnancy related conditions, first trimester: Secondary | ICD-10-CM | POA: Insufficient documentation

## 2020-03-27 DIAGNOSIS — R1032 Left lower quadrant pain: Secondary | ICD-10-CM

## 2020-03-27 DIAGNOSIS — Y92512 Supermarket, store or market as the place of occurrence of the external cause: Secondary | ICD-10-CM | POA: Insufficient documentation

## 2020-03-27 DIAGNOSIS — W19XXXA Unspecified fall, initial encounter: Secondary | ICD-10-CM | POA: Diagnosis not present

## 2020-03-27 DIAGNOSIS — R55 Syncope and collapse: Secondary | ICD-10-CM | POA: Diagnosis not present

## 2020-03-27 DIAGNOSIS — S0990XA Unspecified injury of head, initial encounter: Secondary | ICD-10-CM | POA: Diagnosis not present

## 2020-03-27 DIAGNOSIS — Z87891 Personal history of nicotine dependence: Secondary | ICD-10-CM | POA: Diagnosis not present

## 2020-03-27 DIAGNOSIS — O9A211 Injury, poisoning and certain other consequences of external causes complicating pregnancy, first trimester: Secondary | ICD-10-CM | POA: Diagnosis present

## 2020-03-27 DIAGNOSIS — M7918 Myalgia, other site: Secondary | ICD-10-CM

## 2020-03-27 LAB — BASIC METABOLIC PANEL
Anion gap: 10 (ref 5–15)
BUN: 10 mg/dL (ref 6–20)
CO2: 23 mmol/L (ref 22–32)
Calcium: 9.6 mg/dL (ref 8.9–10.3)
Chloride: 103 mmol/L (ref 98–111)
Creatinine, Ser: 0.73 mg/dL (ref 0.44–1.00)
GFR, Estimated: >60 mL/min (ref >60)
Glucose, Bld: 96 mg/dL (ref 70–99)
Potassium: 3.9 mmol/L (ref 3.5–5.1)
Sodium: 136 mmol/L (ref 135–145)

## 2020-03-27 LAB — CBC
HCT: 35.5 % — ABNORMAL LOW (ref 36.0–46.0)
Hemoglobin: 12.5 g/dL (ref 12.0–15.0)
MCH: 32.2 pg (ref 26.0–34.0)
MCHC: 35.2 g/dL (ref 30.0–36.0)
MCV: 91.5 fL (ref 80.0–100.0)
Platelets: 302 10*3/uL (ref 150–400)
RBC: 3.88 MIL/uL (ref 3.87–5.11)
RDW: 12.1 % (ref 11.5–15.5)
WBC: 14.9 10*3/uL — ABNORMAL HIGH (ref 4.0–10.5)
nRBC: 0 % (ref 0.0–0.2)

## 2020-03-27 LAB — URINALYSIS, ROUTINE W REFLEX MICROSCOPIC
Bilirubin Urine: NEGATIVE
Glucose, UA: NEGATIVE mg/dL
Hgb urine dipstick: NEGATIVE
Ketones, ur: 5 mg/dL — AB
Leukocytes,Ua: NEGATIVE
Nitrite: NEGATIVE
Protein, ur: NEGATIVE mg/dL
Specific Gravity, Urine: 1.026 (ref 1.005–1.030)
pH: 5 (ref 5.0–8.0)

## 2020-03-27 NOTE — MAU Provider Note (Addendum)
S Ms. Brittney Khan is a 34 y.o. G20P0010 female who presents to MAU today with complaint of HA after syncope and fall around 1500 today. Reports being out shopping when she felt hot and SOB before passing out and falling. States witnesses said she hit her head when she fell on a shelving unit. She c/o severe HA and neck pain since. Rates 7/10. Also reports sharp intermittent LLQ pain. Denies VB or cramping.   O BP 126/68   Pulse 94   Temp 98.6 F (37 C)   Resp 16   Ht 5\' 5"  (1.651 m)   Wt 75.3 kg   LMP 12/24/2019   BMI 27.62 kg/m  Physical Exam Vitals and nursing note reviewed.  Constitutional:      General: She is not in acute distress.    Appearance: Normal appearance.  HENT:     Head: Normocephalic and atraumatic.  Pulmonary:     Effort: Pulmonary effort is normal. No respiratory distress.  Musculoskeletal:        General: Normal range of motion.  Neurological:     General: No focal deficit present.     Mental Status: She is alert and oriented to person, place, and time.  Psychiatric:        Mood and Affect: Mood normal.        Behavior: Behavior normal.   FHT 160  MDM: Pregnancy viability confirmed, abd pain likely MSK, no signs of threatened SAB. Recommend further head evaluation in [facility]. Dr. 12/26/2019 accepting provider.  A [redacted] weeks gestation Syncope S/p fall Head trauma Headache LLQ pain  P Transfer to [facility] for further evaluation  Warning signs for worsening condition that would warrant emergency follow-up discussed Patient may return to MAU as needed for pregnancy related complaints  [facility], Donette Larry 03/27/2020 9:50 PM

## 2020-03-27 NOTE — MAU Note (Signed)
Melanie Bhambri CNM in Triage to see pt and discuss plan of care 

## 2020-03-27 NOTE — MAU Note (Signed)
About 1500 passed out in store and hit left side of head and neck on a shelf. Head and neck hurt. I was standing in line and got hot and then I passed out. No vag bleeding. Some pain LLQ of abdomen. Has not taken any meds

## 2020-03-27 NOTE — ED Triage Notes (Signed)
Pt presents to ED POV. Pt c/o syncope and fall. Pt reports she was standing in store, began to feel really hot and had syncopal event. Pt is 13w pregnant, was seen at MAU before ED and baby was cleared. AAOx4, no blood thinners.

## 2020-03-28 LAB — CBG MONITORING, ED: Glucose-Capillary: 81 mg/dL (ref 70–99)

## 2020-03-28 MED ORDER — ACETAMINOPHEN 325 MG PO TABS
650.0000 mg | ORAL_TABLET | Freq: Once | ORAL | Status: AC
  Administered 2020-03-28: 02:00:00 650 mg via ORAL
  Filled 2020-03-28: qty 2

## 2020-03-28 NOTE — ED Provider Notes (Signed)
[facility] EMERGENCY DEPARTMENT Provider Note   CSN: [id number] Arrival date & time: 03/27/20  2104     History Chief Complaint  Patient presents with  . Fall    Brittney Khan is a 34 y.o. female.  The history is provided by the patient.  Loss of Consciousness Episode history:  Single Progression:  Improving Chronicity:  New Relieved by:  Lying down Worsened by:  Nothing Associated symptoms: no chest pain, no seizures and no shortness of breath    Patient is approximately [redacted] weeks pregnant presenting with syncope.  She reports that she will have these episodes while standing.  She feels very hot and lightheaded and at times she is able to sit down.  However earlier in the day she was at a store and began to have the symptoms and had a brief syncopal event.  No seizures reported.  She reports hitting her head during this time.  She now reports mild headache and neck pain.  Denies chest pain or shortness of breath.  No vaginal bleeding.  She reports mild abdominal cramping.    Past Medical History:  Diagnosis Date  . Asthma   . Hip fracture, right ([place]) 2010  . Nausea vomiting and diarrhea 12/23/2012    Patient Active Problem List   Diagnosis Date Noted  . Rh negative state in antepartum period 03/13/2020  . Supervision of low-risk pregnancy, first trimester 03/11/2020  . Nausea/vomiting in pregnancy 03/24/2019  . Left knee pain 08/06/2015  . Infectious colitis 12/23/2012    Past Surgical History:  Procedure Laterality Date  . NO PAST SURGERIES       OB History    Gravida  2   Para      Term      Preterm      AB  1   Living  0     SAB  1   IAB      Ectopic      Multiple      Live Births              No family history on file.  Social History   Tobacco Use  . Smoking status: Former Smoker    Quit date: 04/05/2010    Years since quitting: 9.9  . Smokeless tobacco: Never Used  Vaping Use  . Vaping Use: Never used   Substance Use Topics  . Alcohol use: Yes    Alcohol/week: 0.0 standard drinks    Comment: socially  . Drug use: Yes    Frequency: 1.0 times per week    Types: Marijuana    Comment: last used when i found out i was preg.     Home Medications Prior to Admission medications   Medication Sig Start Date End Date Taking? Authorizing Provider  Prenatal Vit-Fe Fumarate-FA (PRENATAL MULTIVITAMIN) TABS tablet Take 1 tablet by mouth daily at 12 noon.   Yes [provider]  promethazine (PHENERGAN) 25 MG tablet Take 1 tablet (25 mg total) by mouth every 6 (six) hours as needed for nausea or vomiting. 02/26/20  Yes Shelly Bombard, MD  Doxylamine-Pyridoxine 10-10 MG TBEC Take 2 tablets by mouth at bedtime. Patient not taking: No sig reported 02/26/20   Gavin Pound, CNM    Allergies    Reglan [metoclopramide]  Review of Systems   Review of Systems  Respiratory: Negative for shortness of breath.   Cardiovascular: Positive for syncope. Negative for chest pain.  Gastrointestinal: Positive for abdominal pain.  Genitourinary: Negative for vaginal bleeding.  Musculoskeletal: Positive for neck pain.  Neurological: Positive for syncope. Negative for seizures.  All other systems reviewed and are negative.   Physical Exam Updated Vital Signs BP 114/64   Pulse 73   Temp 98.5 F (36.9 C) (Oral)   Resp 15   Ht 1.651 m (5' 5" )   Wt 75.3 kg   LMP 12/24/2019   SpO2 100%   BMI 27.62 kg/m   Physical Exam CONSTITUTIONAL: Well developed/well nourished HEAD: Normocephalic/atraumatic, mild tenderness to left posterior scalp, no crepitus or bruise EYES: EOMI/PERRL ENMT: Mucous membranes moist, no bruising around the face/ears/mastoid, no tongue laceration NECK: supple no meningeal signs SPINE/BACK:entire spine nontender, nexus criteria met, no bruising/crepitance/stepoffs noted to spine CV: S1/S2 noted, no murmurs/rubs/gallops noted LUNGS: Lungs are clear to auscultation bilaterally,  no apparent distress ABDOMEN: soft, nontender, no rebound or guarding, bowel sounds noted throughout abdomen, no bruising GU:no cva tenderness NEURO: Pt is awake/alert/appropriate, moves all extremitiesx4.  No facial droop.  GCS 15 EXTREMITIES: pulses normal/equal, full ROM, all other extremities/joints palpated/ranged and nontender SKIN: warm, color normal PSYCH: no abnormalities of mood noted, alert and oriented to situation  ED Results / Procedures / Treatments   Labs (all labs ordered are listed, but only abnormal results are displayed) Labs Reviewed  CBC - Abnormal; Notable for the following components:      Result Value   WBC 14.9 (*)    HCT 35.5 (*)    All other components within normal limits  URINALYSIS, ROUTINE W REFLEX MICROSCOPIC - Abnormal; Notable for the following components:   APPearance HAZY (*)    Ketones, ur 5 (*)    All other components within normal limits  BASIC METABOLIC PANEL  CBG MONITORING, ED    EKG EKG Interpretation  Date/Time:  Friday March 28 2020 02:00:12 EST Ventricular Rate:  68 PR Interval:    QRS Duration: 126 QT Interval:  401 QTC Calculation: 427 R Axis:   41 Text Interpretation: Sinus rhythm Borderline short PR interval Nonspecific intraventricular conduction delay Borderline T abnormalities, anterior leads Confirmed by Ripley Fraise [phone]) on 03/28/2020 2:31:05 AM   Radiology No results found.  Procedures Ultrasound ED OB Pelvic  Date/Time: 03/28/2020 3:30 AM Performed by: Ripley Fraise, MD Authorized by: Ripley Fraise, MD   Procedure details:    Indications: evaluate for IUP     Assess:  Intrauterine pregnancy   Technique:  Transabdominal obstetric (HCG+) exam   Images: archived   Study Limitations: patient compliance Uterine findings:    Intrauterine pregnancy: identified     Single gestation: identified     Gestational sac: identified     Estimated gestational age: 54 weeks      Medications Ordered  in ED Medications  acetaminophen (TYLENOL) tablet 650 mg (650 mg Oral Given 03/28/20 0225)    ED Course  I have reviewed the triage vital signs and the nursing notes.  Pertinent labs results that were available during my care of the patient were reviewed by me and considered in my medical decision making (see chart for details).    MDM Rules/Calculators/[name]                          Patient presents after syncopal episode at a store.  Patient admits that she has  been having episodes of lightheadedness since the pregnancy began.  She reports she is no longer having any morning sickness  She denies any associated  chest pain or shortness of breath.  She has never had any syncope prior to the pregnancy.  No exertional syncope in the past.  No family history of sudden death  EKG is unremarkable.  There were no events on telemetry monitoring while in the ED.  I performed a limited ED ultrasound to confirm IUP as she reports she has never had an OB ultrasound. The syncopal event occurred several hours prior to my evaluation.  She has no vomiting and there is no external signs of trauma to her head or neck.  Will defer any neuroimaging for now  Patient feels improved and denies lightheadedness upon standing Patient will see her OB around January 4.  I will also give her referral to cardiology.  Patient reports she is no longer driving Final Clinical Impression(s) / ED Diagnoses Final diagnoses:  [redacted] weeks gestation of pregnancy  Fall, initial encounter  Syncope, unspecified syncope type  Intractable acute post-traumatic headache  Muscular abdominal pain in left lower quadrant  Traumatic injury of head, initial encounter    Rx / DC Orders ED Discharge Orders    None       Ripley Fraise, MD 03/28/20 [phone]

## 2020-04-05 NOTE — L&D Delivery Note (Addendum)
Delivery Note Brittney Khan is a 35 y.o. G2P0010 at [redacted]w[redacted]d admitted for PROM.   GBS Status:  Negative/-- (06/02 1112) Maximum Maternal Temperature: 99.103F  Labor course: Initial SVE: 1/50/-3. Augmentation with: AROM, Pitocin, Cytotec, and IP Foley. She then progressed to complete.  ROM: 12h 59m with clear fluid  Birth: At 0900 a viable female was delivered via spontaneous vaginal delivery (Presentation: ROA). Nuchal cord present: Yes, loose nuchal x1, reduced.  Shoulders and body delivered in usual fashion. Infant placed directly on mom's abdomen for bonding/skin-to-skin, baby dried and stimulated. Cord clamped x 2 after 1 minute and cut by FOB.  Cord blood collected.  The placenta separated spontaneously and delivered via gentle cord traction.  Pitocin infused rapidly IV per protocol. TXA also administered given oozing after placenta separation. Fundus firm with massage.  Placenta inspected and appears to be intact with a 3 VC.  Placenta/Cord with the following complications: none .  Cord pH: n/a. Labia, perineum, vagina, and cervix were inspected, notable for 2nd degree perineal laceration which was repaired. Sponge and instrument count were correct x2.  Intrapartum complications:  PIH Anesthesia:  epidural Episiotomy: none Lacerations:  2nd degree perineal Suture Repair: 3.0 vicryl EBL (mL): 600   Infant: APGAR (1 MIN): 9   APGAR (5 MINS): 9   Infant weight: pending  Mom to postpartum.  Baby to Couplet care / Skin to Skin. Placenta to L&D   Plans to Breast and bottlefeed Contraception:  undecided Circumcision: wants inpatient  Note sent to [facility]: [facility] for pp visit.  Littie Deeds, MD PGY-1 09/25/2020 9:23 AM   I was present and gloved for entirety of delivery and I performed the patient repair. I agree with the findings and care plan as documented in the resident's note.  \ Casper Harrison, MD OB Family Medicine Fellow, [facility] for [employer], [facility]

## 2020-04-08 ENCOUNTER — Other Ambulatory Visit: Payer: Self-pay

## 2020-04-08 ENCOUNTER — Ambulatory Visit (INDEPENDENT_AMBULATORY_CARE_PROVIDER_SITE_OTHER): Payer: No Typology Code available for payment source | Admitting: Advanced Practice Midwife

## 2020-04-08 VITALS — BP 115/78 | HR 100 | Wt 164.4 lb

## 2020-04-08 DIAGNOSIS — E639 Nutritional deficiency, unspecified: Secondary | ICD-10-CM

## 2020-04-08 DIAGNOSIS — R55 Syncope and collapse: Secondary | ICD-10-CM

## 2020-04-08 DIAGNOSIS — Z3491 Encounter for supervision of normal pregnancy, unspecified, first trimester: Secondary | ICD-10-CM

## 2020-04-08 DIAGNOSIS — Z3A15 15 weeks gestation of pregnancy: Secondary | ICD-10-CM

## 2020-04-08 MED ORDER — LORATADINE 10 MG PO CAPS: 1.0000 | ORAL_CAPSULE | Freq: Every day | ORAL | 0 refills | Status: DC | PRN

## 2020-04-08 MED ORDER — FLUTICASONE PROPIONATE 50 MCG/ACT NA SUSP: 2.0000 | Freq: Every day | NASAL | 0 refills | Status: DC

## 2020-04-08 NOTE — Patient Instructions (Signed)

## 2020-04-09 NOTE — Progress Notes (Signed)
   PRENATAL VISIT NOTE  Subjective:  Brittney Khan is a 35 y.o. G2P0010 at [redacted]w[redacted]d being seen today for ongoing prenatal care.  She is currently monitored for the following issues for this low-risk pregnancy and has Infectious colitis; Left knee pain; Nausea/vomiting in pregnancy; Supervision of low-risk pregnancy, first trimester; and Rh negative state in antepartum period on their problem list.  Patient reports two days of irreular nausea and vomiting with subsequent loss of appetite and low energy. She is s/p evaluation in the ED on 03/28/2020 for syncope. No episodes since that time. Wearing a holter monitor until 04/13/2020 with oversight from [place] in [place].   Patient states her previous episodes occurred with prolonged standing followed "my head getting fuzzy". Today has only eaten cereal and sips of water.  Contractions: Not present. Vag. Bleeding: None.  Movement: Absent. Denies leaking of fluid.   The following portions of the patient's history were reviewed and updated as appropriate: allergies, current medications, past family history, past medical history, past social history, past surgical history and problem list. Problem list updated.  Objective:   Vitals:   04/08/20 1529  BP: 115/78  Pulse: 100  Weight: 164 lb 6.4 oz (74.6 kg)    Fetal Status: Fetal Heart Rate (bpm): 150   Movement: Absent     General:  Alert, oriented and cooperative. Patient is in no acute distress.  Skin: Skin is warm and dry. No rash noted.   Cardiovascular: Normal heart rate noted  Respiratory: Normal respiratory effort, no problems with respiration noted  Abdomen: Soft, gravid, appropriate for gestational age.  Pain/Pressure: Absent     Pelvic: Cervical exam deferred        Extremities: Normal range of motion.  Edema: None  Mental Status: Normal mood and affect. Normal behavior. Normal judgment and thought content.   Assessment and Plan:  Pregnancy: G2P0010 at [redacted]w[redacted]d  1. Supervision of  low-risk pregnancy, first trimester - LOB, anatomy scan scheduled  2. [redacted] weeks gestation of pregnancy   3. Syncope, unspecified syncope type - S/p normal EKG in [facility] 03/28/2020 - S/p normal CBC and BMET during [facility] evalaution - Returns Holter monitor 04/13/2020  4. Inadequate dietary intake - No ketones on urine dip in office today - Advised COVID test at community site given new onset nausea, vomiting, loss of appetite, irregular headache  Preterm labor symptoms and general obstetric precautions including but not limited to vaginal bleeding, contractions, leaking of fluid and fetal movement were reviewed in detail with the patient. Please refer to After Visit Summary for other counseling recommendations.    Future Appointments  Date Time Provider Department Center  05/06/2020  8:00 AM [facility] NURSE [facility]  05/06/2020  8:15 AM [facility]    Calvert Cantor, CNM

## 2020-05-06 ENCOUNTER — Ambulatory Visit: Payer: No Typology Code available for payment source | Attending: Family Medicine

## 2020-05-06 ENCOUNTER — Ambulatory Visit: Payer: No Typology Code available for payment source | Admitting: *Deleted

## 2020-05-06 ENCOUNTER — Other Ambulatory Visit: Payer: Self-pay

## 2020-05-06 ENCOUNTER — Encounter: Payer: Self-pay | Admitting: *Deleted

## 2020-05-06 DIAGNOSIS — Z3491 Encounter for supervision of normal pregnancy, unspecified, first trimester: Secondary | ICD-10-CM

## 2020-05-07 ENCOUNTER — Other Ambulatory Visit: Payer: Self-pay

## 2020-05-07 ENCOUNTER — Ambulatory Visit (INDEPENDENT_AMBULATORY_CARE_PROVIDER_SITE_OTHER): Payer: No Typology Code available for payment source | Admitting: Family Medicine

## 2020-05-07 ENCOUNTER — Encounter: Payer: Self-pay | Admitting: Family Medicine

## 2020-05-07 VITALS — BP 131/86 | HR 96 | Wt 174.2 lb

## 2020-05-07 DIAGNOSIS — Z6791 Unspecified blood type, Rh negative: Secondary | ICD-10-CM

## 2020-05-07 DIAGNOSIS — Z3491 Encounter for supervision of normal pregnancy, unspecified, first trimester: Secondary | ICD-10-CM

## 2020-05-07 DIAGNOSIS — R55 Syncope and collapse: Secondary | ICD-10-CM

## 2020-05-07 DIAGNOSIS — O26899 Other specified pregnancy related conditions, unspecified trimester: Secondary | ICD-10-CM

## 2020-05-07 NOTE — Progress Notes (Signed)
   Subjective:  Brittney Khan is a 35 y.o. G2P0010 at [redacted]w[redacted]d being seen today for ongoing prenatal care.  She is currently monitored for the following issues for this low-risk pregnancy and has Infectious colitis; Left knee pain; Nausea/vomiting in pregnancy; Supervision of low-risk pregnancy, first trimester; Rh negative state in antepartum period; and Syncope on their problem list.  Patient reports no complaints.  Contractions: Not present. Vag. Bleeding: None.  Movement: Present. Denies leaking of fluid.   The following portions of the patient's history were reviewed and updated as appropriate: allergies, current medications, past family history, past medical history, past social history, past surgical history and problem list. Problem list updated.  Objective:   Vitals:   05/07/20 1527  BP: 131/86  Pulse: 96  Weight: 174 lb 3.2 oz (79 kg)    Fetal Status: Fetal Heart Rate (bpm): 140   Movement: Present     General:  Alert, oriented and cooperative. Patient is in no acute distress.  Skin: Skin is warm and dry. No rash noted.   Cardiovascular: Normal heart rate noted  Respiratory: Normal respiratory effort, no problems with respiration noted  Abdomen: Soft, gravid, appropriate for gestational age. Pain/Pressure: Absent     Pelvic: Vag. Bleeding: None     Cervical exam deferred        Extremities: Normal range of motion.  Edema: None  Mental Status: Normal mood and affect. Normal behavior. Normal judgment and thought content.   Urinalysis:      Assessment and Plan:  Pregnancy: G2P0010 at [redacted]w[redacted]d  1. Supervision of low-risk pregnancy, first trimester BP and FHR normal APF today Anatomy scan unremarkable - AFP, Serum, Open Spina Bifida  2. Rh negative state in antepartum period Rhogam @ 28 weeks  3. Syncope Went to ED, see notes from end of December Given heart monitor, wore it for a week but has not yet heard back about results No episodes of syncope since then Will  await results  Preterm labor symptoms and general obstetric precautions including but not limited to vaginal bleeding, contractions, leaking of fluid and fetal movement were reviewed in detail with the patient. Please refer to After Visit Summary for other counseling recommendations.  Return in 4 weeks (on 06/04/2020) for [facility], ob visit.   Venora Maples, MD

## 2020-05-07 NOTE — Patient Instructions (Signed)
 Contraception Choices Contraception, also called birth control, refers to methods or devices that prevent pregnancy. Hormonal methods Contraceptive implant A contraceptive implant is a thin, plastic tube that contains a hormone that prevents pregnancy. It is different from an intrauterine device (IUD). It is inserted into the upper part of the arm by a health care provider. Implants can be effective for up to 3 years. Progestin-only injections Progestin-only injections are injections of progestin, a synthetic form of the hormone progesterone. They are given every 3 months by a health care provider. Birth control pills Birth control pills are pills that contain hormones that prevent pregnancy. They must be taken once a day, preferably at the same time each day. A prescription is needed to use this method of contraception. Birth control patch The birth control patch contains hormones that prevent pregnancy. It is placed on the skin and must be changed once a week for three weeks and removed on the fourth week. A prescription is needed to use this method of contraception. Vaginal ring A vaginal ring contains hormones that prevent pregnancy. It is placed in the vagina for three weeks and removed on the fourth week. After that, the process is repeated with a new ring. A prescription is needed to use this method of contraception. Emergency contraceptive Emergency contraceptives prevent pregnancy after unprotected sex. They come in pill form and can be taken up to 5 days after sex. They work best the sooner they are taken after having sex. Most emergency contraceptives are available without a prescription. This method should not be used as your only form of birth control.   Barrier methods Female condom A female condom is a thin sheath that is worn over the penis during sex. Condoms keep sperm from going inside a woman's body. They can be used with a sperm-killing substance (spermicide) to increase their  effectiveness. They should be thrown away after one use. Female condom A female condom is a soft, loose-fitting sheath that is put into the vagina before sex. The condom keeps sperm from going inside a woman's body. They should be thrown away after one use. Diaphragm A diaphragm is a soft, dome-shaped barrier. It is inserted into the vagina before sex, along with a spermicide. The diaphragm blocks sperm from entering the uterus, and the spermicide kills sperm. A diaphragm should be left in the vagina for 6-8 hours after sex and removed within 24 hours. A diaphragm is prescribed and fitted by a health care provider. A diaphragm should be replaced every 1-2 years, after giving birth, after gaining more than 15 lb (6.8 kg), and after pelvic surgery. Cervical cap A cervical cap is a round, soft latex or plastic cup that fits over the cervix. It is inserted into the vagina before sex, along with spermicide. It blocks sperm from entering the uterus. The cap should be left in place for 6-8 hours after sex and removed within 48 hours. A cervical cap must be prescribed and fitted by a health care provider. It should be replaced every 2 years. Sponge A sponge is a soft, circular piece of polyurethane foam with spermicide in it. The sponge helps block sperm from entering the uterus, and the spermicide kills sperm. To use it, you make it wet and then insert it into the vagina. It should be inserted before sex, left in for at least 6 hours after sex, and removed and thrown away within 30 hours. Spermicides Spermicides are chemicals that kill or block sperm from entering the   cervix and uterus. They can come as a cream, jelly, suppository, foam, or tablet. A spermicide should be inserted into the vagina with an applicator at least 10-15 minutes before sex to allow time for it to work. The process must be repeated every time you have sex. Spermicides do not require a prescription.   Intrauterine  contraception Intrauterine device (IUD) An IUD is a T-shaped device that is put in a woman's uterus. There are two types:  Hormone IUD.This type contains progestin, a synthetic form of the hormone progesterone. This type can stay in place for 3-5 years.  Copper IUD.This type is wrapped in copper wire. It can stay in place for 10 years. Permanent methods of contraception Female tubal ligation In this method, a woman's fallopian tubes are sealed, tied, or blocked during surgery to prevent eggs from traveling to the uterus. Hysteroscopic sterilization In this method, a small, flexible insert is placed into each fallopian tube. The inserts cause scar tissue to form in the fallopian tubes and block them, so sperm cannot reach an egg. The procedure takes about 3 months to be effective. Another form of birth control must be used during those 3 months. Female sterilization This is a procedure to tie off the tubes that carry sperm (vasectomy). After the procedure, the man can still ejaculate fluid (semen). Another form of birth control must be used for 3 months after the procedure. Natural planning methods Natural family planning In this method, a couple does not have sex on days when the woman could become pregnant. Calendar method In this method, the woman keeps track of the length of each menstrual cycle, identifies the days when pregnancy can happen, and does not have sex on those days. Ovulation method In this method, a couple avoids sex during ovulation. Symptothermal method This method involves not having sex during ovulation. The woman typically checks for ovulation by watching changes in her temperature and in the consistency of cervical mucus. Post-ovulation method In this method, a couple waits to have sex until after ovulation. Where to find more information  Centers for Disease Control and Prevention: www.cdc.gov Summary  Contraception, also called birth control, refers to methods or  devices that prevent pregnancy.  Hormonal methods of contraception include implants, injections, pills, patches, vaginal rings, and emergency contraceptives.  Barrier methods of contraception can include female condoms, female condoms, diaphragms, cervical caps, sponges, and spermicides.  There are two types of IUDs (intrauterine devices). An IUD can be put in a woman's uterus to prevent pregnancy for 3-5 years.  Permanent sterilization can be done through a procedure for males and females. Natural family planning methods involve nothaving sex on days when the woman could become pregnant. This information is not intended to replace advice given to you by your health care provider. Make sure you discuss any questions you have with your health care provider. Document Revised: 08/27/2019 Document Reviewed: 08/27/2019 Elsevier Patient Education  2021 Elsevier Inc.   Breastfeeding  Choosing to breastfeed is one of the best decisions you can make for yourself and your baby. A change in hormones during pregnancy causes your breasts to make breast milk in your milk-producing glands. Hormones prevent breast milk from being released before your baby is born. They also prompt milk flow after birth. Once breastfeeding has begun, thoughts of your baby, as well as his or her sucking or crying, can stimulate the release of milk from your milk-producing glands. Benefits of breastfeeding Research shows that breastfeeding offers many health benefits   for infants and mothers. It also offers a cost-free and convenient way to feed your baby. For your baby  Your first milk (colostrum) helps your baby's digestive system to function better.  Special cells in your milk (antibodies) help your baby to fight off infections.  Breastfed babies are less likely to develop asthma, allergies, obesity, or type 2 diabetes. They are also at lower risk for sudden infant death syndrome (SIDS).  Nutrients in breast milk are better  able to meet your baby's needs compared to infant formula.  Breast milk improves your baby's brain development. For you  Breastfeeding helps to create a very special bond between you and your baby.  Breastfeeding is convenient. Breast milk costs nothing and is always available at the correct temperature.  Breastfeeding helps to burn calories. It helps you to lose the weight that you gained during pregnancy.  Breastfeeding makes your uterus return faster to its size before pregnancy. It also slows bleeding (lochia) after you give birth.  Breastfeeding helps to lower your risk of developing type 2 diabetes, osteoporosis, rheumatoid arthritis, cardiovascular disease, and breast, ovarian, uterine, and endometrial cancer later in life. Breastfeeding basics Starting breastfeeding  Find a comfortable place to sit or lie down, with your neck and back well-supported.  Place a pillow or a rolled-up blanket under your baby to bring him or her to the level of your breast (if you are seated). Nursing pillows are specially designed to help support your arms and your baby while you breastfeed.  Make sure that your baby's tummy (abdomen) is facing your abdomen.  Gently massage your breast. With your fingertips, massage from the outer edges of your breast inward toward the nipple. This encourages milk flow. If your milk flows slowly, you may need to continue this action during the feeding.  Support your breast with 4 fingers underneath and your thumb above your nipple (make the letter "C" with your hand). Make sure your fingers are well away from your nipple and your baby's mouth.  Stroke your baby's lips gently with your finger or nipple.  When your baby's mouth is open wide enough, quickly bring your baby to your breast, placing your entire nipple and as much of the areola as possible into your baby's mouth. The areola is the colored area around your nipple. ? More areola should be visible above your  baby's upper lip than below the lower lip. ? Your baby's lips should be opened and extended outward (flanged) to ensure an adequate, comfortable latch. ? Your baby's tongue should be between his or her lower gum and your breast.  Make sure that your baby's mouth is correctly positioned around your nipple (latched). Your baby's lips should create a seal on your breast and be turned out (everted).  It is common for your baby to suck about 2-3 minutes in order to start the flow of breast milk. Latching Teaching your baby how to latch onto your breast properly is very important. An improper latch can cause nipple pain, decreased milk supply, and poor weight gain in your baby. Also, if your baby is not latched onto your nipple properly, he or she may swallow some air during feeding. This can make your baby fussy. Burping your baby when you switch breasts during the feeding can help to get rid of the air. However, teaching your baby to latch on properly is still the best way to prevent fussiness from swallowing air while breastfeeding. Signs that your baby has successfully latched onto   your nipple  Silent tugging or silent sucking, without causing you pain. Infant's lips should be extended outward (flanged).  Swallowing heard between every 3-4 sucks once your milk has started to flow (after your let-down milk reflex occurs).  Muscle movement above and in front of his or her ears while sucking. Signs that your baby has not successfully latched onto your nipple  Sucking sounds or smacking sounds from your baby while breastfeeding.  Nipple pain. If you think your baby has not latched on correctly, slip your finger into the corner of your baby's mouth to break the suction and place it between your baby's gums. Attempt to start breastfeeding again. Signs of successful breastfeeding Signs from your baby  Your baby will gradually decrease the number of sucks or will completely stop sucking.  Your baby  will fall asleep.  Your baby's body will relax.  Your baby will retain a small amount of milk in his or her mouth.  Your baby will let go of your breast by himself or herself. Signs from you  Breasts that have increased in firmness, weight, and size 1-3 hours after feeding.  Breasts that are softer immediately after breastfeeding.  Increased milk volume, as well as a change in milk consistency and color by the fifth day of breastfeeding.  Nipples that are not sore, cracked, or bleeding. Signs that your baby is getting enough milk  Wetting at least 1-2 diapers during the first 24 hours after birth.  Wetting at least 5-6 diapers every 24 hours for the first week after birth. The urine should be clear or pale yellow by the age of 5 days.  Wetting 6-8 diapers every 24 hours as your baby continues to grow and develop.  At least 3 stools in a 24-hour period by the age of 5 days. The stool should be soft and yellow.  At least 3 stools in a 24-hour period by the age of 7 days. The stool should be seedy and yellow.  No loss of weight greater than 10% of birth weight during the first 3 days of life.  Average weight gain of 4-7 oz (113-198 g) per week after the age of 4 days.  Consistent daily weight gain by the age of 5 days, without weight loss after the age of 2 weeks. After a feeding, your baby may spit up a small amount of milk. This is normal. Breastfeeding frequency and duration Frequent feeding will help you make more milk and can prevent sore nipples and extremely full breasts (breast engorgement). Breastfeed when you feel the need to reduce the fullness of your breasts or when your baby shows signs of hunger. This is called "breastfeeding on demand." Signs that your baby is hungry include:  Increased alertness, activity, or restlessness.  Movement of the head from side to side.  Opening of the mouth when the corner of the mouth or cheek is stroked (rooting).  Increased  sucking sounds, smacking lips, cooing, sighing, or squeaking.  Hand-to-mouth movements and sucking on fingers or hands.  Fussing or crying. Avoid introducing a pacifier to your baby in the first 4-6 weeks after your baby is born. After this time, you may choose to use a pacifier. Research has shown that pacifier use during the first year of a baby's life decreases the risk of sudden infant death syndrome (SIDS). Allow your baby to feed on each breast as long as he or she wants. When your baby unlatches or falls asleep while feeding from the   first breast, offer the second breast. Because newborns are often sleepy in the first few weeks of life, you may need to awaken your baby to get him or her to feed. Breastfeeding times will vary from baby to baby. However, the following rules can serve as a guide to help you make sure that your baby is properly fed:  Newborns (babies 4 weeks of age or younger) may breastfeed every 1-3 hours.  Newborns should not go without breastfeeding for longer than 3 hours during the day or 5 hours during the night.  You should breastfeed your baby a minimum of 8 times in a 24-hour period. Breast milk pumping Pumping and storing breast milk allows you to make sure that your baby is exclusively fed your breast milk, even at times when you are unable to breastfeed. This is especially important if you go back to work while you are still breastfeeding, or if you are not able to be present during feedings. Your lactation consultant can help you find a method of pumping that works best for you and give you guidelines about how long it is safe to store breast milk.      Caring for your breasts while you breastfeed Nipples can become dry, cracked, and sore while breastfeeding. The following recommendations can help keep your breasts moisturized and healthy:  Avoid using soap on your nipples.  Wear a supportive bra designed especially for nursing. Avoid wearing underwire-style  bras or extremely tight bras (sports bras).  Air-dry your nipples for 3-4 minutes after each feeding.  Use only cotton bra pads to absorb leaked breast milk. Leaking of breast milk between feedings is normal.  Use lanolin on your nipples after breastfeeding. Lanolin helps to maintain your skin's normal moisture barrier. Pure lanolin is not harmful (not toxic) to your baby. You may also hand express a few drops of breast milk and gently massage that milk into your nipples and allow the milk to air-dry. In the first few weeks after giving birth, some women experience breast engorgement. Engorgement can make your breasts feel heavy, warm, and tender to the touch. Engorgement peaks within 3-5 days after you give birth. The following recommendations can help to ease engorgement:  Completely empty your breasts while breastfeeding or pumping. You may want to start by applying warm, moist heat (in the shower or with warm, water-soaked hand towels) just before feeding or pumping. This increases circulation and helps the milk flow. If your baby does not completely empty your breasts while breastfeeding, pump any extra milk after he or she is finished.  Apply ice packs to your breasts immediately after breastfeeding or pumping, unless this is too uncomfortable for you. To do this: ? Put ice in a plastic bag. ? Place a towel between your skin and the bag. ? Leave the ice on for 20 minutes, 2-3 times a day.  Make sure that your baby is latched on and positioned properly while breastfeeding. If engorgement persists after 48 hours of following these recommendations, contact your health care provider or a lactation consultant. Overall health care recommendations while breastfeeding  Eat 3 healthy meals and 3 snacks every day. Well-nourished mothers who are breastfeeding need an additional 450-500 calories a day. You can meet this requirement by increasing the amount of a balanced diet that you eat.  Drink  enough water to keep your urine pale yellow or clear.  Rest often, relax, and continue to take your prenatal vitamins to prevent fatigue, stress, and low   vitamin and mineral levels in your body (nutrient deficiencies).  Do not use any products that contain nicotine or tobacco, such as cigarettes and e-cigarettes. Your baby may be harmed by chemicals from cigarettes that pass into breast milk and exposure to secondhand smoke. If you need help quitting, ask your health care provider.  Avoid alcohol.  Do not use illegal drugs or marijuana.  Talk with your health care provider before taking any medicines. These include over-the-counter and prescription medicines as well as vitamins and herbal supplements. Some medicines that may be harmful to your baby can pass through breast milk.  It is possible to become pregnant while breastfeeding. If birth control is desired, ask your health care provider about options that will be safe while breastfeeding your baby. Where to find more information: [employer]: www.llli.org Contact a health care provider if:  You feel like you want to stop breastfeeding or have become frustrated with breastfeeding.  Your nipples are cracked or bleeding.  Your breasts are red, tender, or warm.  You have: ? Painful breasts or nipples. ? A swollen area on either breast. ? A fever or chills. ? Nausea or vomiting. ? Drainage other than breast milk from your nipples.  Your breasts do not become full before feedings by the fifth day after you give birth.  You feel sad and depressed.  Your baby is: ? Too sleepy to eat well. ? Having trouble sleeping. ? More than 1 week old and wetting fewer than 6 diapers in a 24-hour period. ? Not gaining weight by 5 days of age.  Your baby has fewer than 3 stools in a 24-hour period.  Your baby's skin or the white parts of his or her eyes become yellow. Get help right away if:  Your baby is overly tired  (lethargic) and does not want to wake up and feed.  Your baby develops an unexplained fever. Summary  Breastfeeding offers many health benefits for infant and mothers.  Try to breastfeed your infant when he or she shows early signs of hunger.  Gently tickle or stroke your baby's lips with your finger or nipple to allow the baby to open his or her mouth. Bring the baby to your breast. Make sure that much of the areola is in your baby's mouth. Offer one side and burp the baby before you offer the other side.  Talk with your health care provider or lactation consultant if you have questions or you face problems as you breastfeed. This information is not intended to replace advice given to you by your health care provider. Make sure you discuss any questions you have with your health care provider. Document Revised: 06/16/2017 Document Reviewed: 04/23/2016 Elsevier Patient Education  2021 Elsevier Inc.  

## 2020-05-09 ENCOUNTER — Telehealth: Payer: Self-pay

## 2020-05-09 NOTE — Telephone Encounter (Signed)
Pt states she got the Moderna vaccine. Pt is going to try and upload a picture of it to [employer]

## 2020-05-14 LAB — AFP, SERUM, OPEN SPINA BIFIDA
AFP MoM: 1.33
AFP Value: 69.2 ng/mL
Gest. Age on Collection Date: 19.3 weeks
Maternal Age At EDD: 33.9 yr
OSBR Risk 1 IN: 8798
Test Results:: NEGATIVE
Weight: 174 [lb_av]

## 2020-06-04 ENCOUNTER — Encounter: Payer: Self-pay | Admitting: Nurse Practitioner

## 2020-06-04 ENCOUNTER — Telehealth (INDEPENDENT_AMBULATORY_CARE_PROVIDER_SITE_OTHER): Payer: No Typology Code available for payment source | Admitting: Nurse Practitioner

## 2020-06-04 VITALS — BP 128/75 | HR 76

## 2020-06-04 DIAGNOSIS — Z3491 Encounter for supervision of normal pregnancy, unspecified, first trimester: Secondary | ICD-10-CM

## 2020-06-04 DIAGNOSIS — O26892 Other specified pregnancy related conditions, second trimester: Secondary | ICD-10-CM

## 2020-06-04 DIAGNOSIS — Z3A23 23 weeks gestation of pregnancy: Secondary | ICD-10-CM

## 2020-06-04 DIAGNOSIS — O99891 Other specified diseases and conditions complicating pregnancy: Secondary | ICD-10-CM

## 2020-06-04 DIAGNOSIS — M549 Dorsalgia, unspecified: Secondary | ICD-10-CM

## 2020-06-04 NOTE — Patient Instructions (Addendum)
Back Pain in Pregnancy Back pain during pregnancy is common. Back pain may be caused by several factors that are related to changes during your pregnancy. Follow these instructions at home: Managing pain, stiffness, and swelling  If directed, for sudden (acute) back pain, put ice on the painful area. ? Put ice in a plastic bag. ? Place a towel between your skin and the bag. ? Leave the ice on for 20 minutes, 2-3 times per day.  If directed, apply heat to the affected area before you exercise. Use the heat source that your health care provider recommends, such as a moist heat pack or a heating pad. ? Place a towel between your skin and the heat source. ? Leave the heat on for 20-30 minutes. ? Remove the heat if your skin turns bright red. This is especially important if you are unable to feel pain, heat, or cold. You may have a greater risk of getting burned.  If directed, massage the affected area.      Activity  Exercise as told by your health care provider. Gentle exercise is the best way to prevent or manage back pain.  Listen to your body when lifting. If lifting hurts, ask for help or bend your knees. This uses your leg muscles instead of your back muscles.  Squat down when picking up something from the floor. Do not bend over.  Only use bed rest for short periods as told by your health care provider. Bed rest should only be used for the most severe episodes of back pain. Standing, sitting, and lying down  Do not stand in one place for long periods of time.  Use good posture when sitting. Make sure your head rests over your shoulders and is not hanging forward. Use a pillow on your lower back if necessary.  Try sleeping on your side, preferably the left side, with a pregnancy support pillow or 1-2 regular pillows between your legs. ? If you have back pain after a night's rest, your bed may be too soft. ? A firm mattress may provide more support for your back during  pregnancy. General instructions  Do not wear high heels.  Eat a healthy diet. Try to gain weight within your health care provider's recommendations.  Use a maternity girdle, elastic sling, or back brace as told by your health care provider.  Take over-the-counter and prescription medicines only as told by your health care provider.  Work with a physical therapist or massage therapist to find ways to manage back pain. Acupuncture or massage therapy may be helpful.  Keep all follow-up visits as told by your health care provider. This is important. Contact a health care provider if:  Your back pain interferes with your daily activities.  You have increasing pain in other parts of your body. Get help right away if:  You develop numbness, tingling, weakness, or problems with the use of your arms or legs.  You develop severe back pain that is not controlled with medicine.  You have a change in bowel or bladder control.  You develop shortness of breath, dizziness, or you faint.  You develop nausea, vomiting, or sweating.  You have back pain that is a rhythmic, cramping pain similar to labor pains. Labor pain is usually 1-2 minutes apart, lasts for about 1 minute, and involves a bearing down feeling or pressure in your pelvis.  You have back pain and your water breaks or you have vaginal bleeding.  You have back pain or   numbness that travels down your leg.  Your back pain developed after you fell.  You develop pain on one side of your back.  You see blood in your urine.  You develop skin blisters in the area of your back pain. Summary  Back pain may be caused by several factors that are related to changes during your pregnancy.  Follow instructions as told by your health care provider for managing pain, stiffness, and swelling.  Exercise as told by your health care provider. Gentle exercise is the best way to prevent or manage back pain.  Take over-the-counter and  prescription medicines only as told by your health care provider.  Keep all follow-up visits as told by your health care provider. This is important. This information is not intended to replace advice given to you by your health care provider. Make sure you discuss any questions you have with your health care provider. Document Revised: 07/11/2018 Document Reviewed: 09/07/2017 Elsevier Patient Education  2021 Elsevier Inc.   BENEFITS OF BREASTFEEDING Many women wonder if they should breastfeed. Research shows that breast milk contains the perfect balance of vitamins, protein and fat that your baby needs to grow. It also contains antibodies that help your baby's immune system to fight off viruses and bacteria and can reduce the risk of sudden infant death syndrome (SIDS). In addition, the colostrum (a fluid secreted from the breast in the first few days after delivery) helps your newborn's digestive system to grow and function well. Breast milk is easier to digest than formula. Also, if your baby is born preterm, breast milk can help to reduce both short- and long-term health problems. BENEFITS OF BREASTFEEDING FOR MOM . Breastfeeding causes a hormone to be released that helps the uterus to contract and return to its normal size more quickly. . It aids in postpartum weight loss, reduces risk of breast and ovarian cancer, heart disease and rheumatoid arthritis. . It decreases the amount of bleeding after the baby is born. benefits of breastfeeding for baby . Provides comfort and nutrition . Protects baby against - Obesity - Diabetes - Asthma - Childhood cancers - Heart disease - Ear infections - Diarrhea - Pneumonia - Stomach problems - Serious allergies - Skin rashes . Promotes growth and development . Reduces the risk of baby having Sudden Infant Death Syndrome (SIDS) only breastmilk for the first 6 months . Protects baby against diseases/allergies . It's the perfect amount for tiny  bellies . It restores baby's energy . Provides the best nutrition for baby . Giving water or formula can make baby more likely to get sick, decrease Mom's milk supply, make baby less content with breastfeeding Skin to Skin After delivery, the staff will place your baby on your chest. This helps with the following: . Regulates baby's temperature, breathing, heart rate and blood sugar . Increases Mom's milk supply . Promotes bonding . Keeps baby and Mom calm and decreases baby's crying Rooming In Your baby will stay in your room with you for the entire time you are in the hospital. This helps with the following: . Allows Mom to learn baby's feeding cues - Fluttering eyes - Sucking on tongue or hand - Rooting (opens mouth and turns head) - Nuzzling into the breast - Bringing hand to mouth . Allows breastfeeding on demand (when your baby is ready) . Helps baby to be calm and content . Ensures a good milk supply . Prevents complications with breastfeeding . Allows parents to learn to care for baby .  Allows you to request assistance with breastfeeding Importance of a good latch . Increases milk transfer to baby - baby gets enough milk . Ensures you have enough milk for your baby . Decreases nipple soreness . Don't use pacifiers and bottles - these cause baby to suck differently than breastfeeding . Promotes continuation of breastfeeding Risks of Formula Supplementation with Breastfeeding Giving your infant formula in addition to your breast-milk EXCEPT when medically necessary can lead to: Marland Kitchen Decreases your milk supply  . Loss of confidence in yourself for providing baby's nutrition  . Engorgement and possibly mastitis  . Asthma & allergies in the baby BREASTFEEDING FAQS How long should I breastfeed my baby? It is recommended that you provide your baby with breast milk only for the first 6 months and then continue for the first year and longer as desired. During the first few weeks  after birth, your baby will need to feed 8-12 times every 24 hours, or every 2-3 hours. They will likely feed for 15-30 minutes. How can I help my baby begin breastfeeding? Babies are born with an instinct to breastfeed. A healthy baby can begin breastfeeding right away without specific help. At the hospital, a nurse (or lactation consultant) will help you begin the process and will give you tips on good positioning. It may be helpful to take a breastfeeding class before you deliver in order to know what to expect. How can I help my baby latch on? In order to assist your baby in latching-on, cup your breast in your hand and stroke your baby's lower lip with your nipple to stimulate your baby's rooting reflex. Your baby will look like he or she is yawning, at which point you should bring the baby towards your breast, while aiming the nipple at the roof of his or her mouth. Remember to bring the baby towards you and not your breast towards the baby. How can I tell if my baby is latched-on? Your baby will have all of your nipple and part of the dark area around the nipple in his or her mouth and your baby's nose will be touching your breast. You should see or hear the baby swallowing. If the baby is not latched-on properly, start the process over. To remove the suction, insert a clean finger between your breast and the baby's mouth. Should I switch breasts during feeding? After feeding on one side, switch the baby to your other breast. If he or she does not continue feeding - that is OK. Your baby will not necessarily need to feed from both breasts in a single feeding. On the next feeding, start with the other breast for efficiency and comfort. How can I tell if my baby is hungry? When your baby is hungry, they will nuzzle against your breast, make sucking noises and tongue motions and may put their hands near their mouth. Crying is a late sign of hunger, so you should not wait until this point. When they  have received enough milk, they will unlatch from the breast. Is it okay to use a pacifier? Until your baby gets the hang of breastfeeding, experts recommend limiting pacifier usage. If you have questions about this, please contact your pediatrician. What can I do to ensure proper nutrition while breastfeeding? . Make sure that you support your own health and your baby's by eating a healthy, well-balanced diet . Your provider may recommend that you continue to take your prenatal vitamin . Drink plenty of fluids. It is a  good rule to drink one glass of water before or after feeding . Alcohol will remain in the breast milk for as long as it will remain in the blood stream. If you choose to have a drink, it is recommended that you wait at least 2 hours before feeding . Moderate amounts of caffeine are OK . Some over-the-counter or prescription medications are not recommended during breastfeeding. Check with your provider if you have questions What types of birth control methods are safe while breastfeeding? Progestin-only methods, including a daily pill, an IUD, the implant and the injection are safe while breastfeeding. Methods that contain estrogen (such as combination birth control pills, the vaginal ring and the patch) should not be used during the first month of breastfeeding as these can decrease your milk supply.   AREA PEDIATRIC/FAMILY PRACTICE PHYSICIANS  [place] ([id number]) . [facility] Melodie Bouillono Chambliss, MD; Lum BabeEniola, MD; Sheffield SliderHale, MD; Leveda AnnaHensel, MD; McDiarmid, MD; Jerene BearsMcIntyer, MD; Jennette KettleNeal, MD; Gwendolyn GrantWalden, MD o [address] [place], [place] [address] o [phone] o Mon-Fri 8:30-12:30, 1:30-5:00 o Providers come to see babies at [facility] o Accepting Medicaid . [facility] o Limited providers who accept newborns: Docia ChuckKoirala, MD; Kateri PlummerMorrow, MD; Paulino RilyWolters, MD o [address], [place], [place]  [address] o [phone] o Mon-Fri 8:00-5:30 o Babies seen by providers at [facility] o Does NOT accept Medicaid o Please call early in hospitalization for appointment (limited availability)  . [facility] Fatima Sangero Mulberry, MD o [address] [place], [place] [address] o [phone] o Mon, Tue, Thur, Fri 8:30-5:00, Wed 10:00-7:00 (closed 1-2pm) o Babies seen by [facility] providers o Accepting Medicaid . Donnie Coffinubin - Pediatrician Fae Pippino Rubin, MD o [address], [place], [place] [address] o [phone] o Mon-Fri 8:30-5:00, Sat 8:30-12:00 o Provider comes to see babies at [facility] o Accepting Medicaid o Must have been referred from current patients or contacted office prior to delivery . [facility] Leotis Paino Brown, MD; Ave Filterhandler, MD; Luna FuseEttefagh, MD; Kennedy BuckerGrant, MD; Konrad DoloresLester, MD; Kathlene NovemberMcCormick, MD; Jenne CampusMcQueen, MD; Lubertha SouthProse, MD; Wynetta EmerySimha, MD; Duffy RhodyStanley, MD; Gerre CouchStryffeler, NP; Shirl Harrisebben, NP o [address], [place], [place] [address] o [phone] o Mon, Tue, Thur, Fri 8:30-5:30, Wed 9:30-5:30, Sat 8:30-12:30 o Babies seen by [facility] providers o Accepting Medicaid o Only accepting infants of first-time parents or siblings of current patients [facility] discharge coordinator will make follow-up appointment . Cyril MourningJack Amos o [address], [place], [place]  [address] o [phone]   Fax - [phone] . [facility] o [address], [place], [place]  [address] o Phone - [phone]   Fax - [phone] . Lucio EdwardShilpa Gosrani o [address], [place], [place]  [address] o [phone]  [place] [phone]) . [facility] Jorge Mandrilo Bates, MD; Alita Ch[facility]Brass[place], MD; Princella Ionooper, Cox, MD; MD; Earlene Plateravis, MD; Jamesetta Orleansovico, MD; Alvera NovelEttefaugh, MD; Clarene DukeLittle, MD; Rana SnareLowe, MD; Carmon GinsbergKeiffer, MD; Alinda MoneyMelvin, MD; Hosie PoissonSumner, MD; Mayford KnifeWilliams, MD o [address], [place], [place] [address] o [phone] o Mon-Fri  8:30-5:00 (extended evenings Mon-Thur as needed), Sat-Sun 10:00-1:00 o Providers come to see babies at [facility] o Accepting Medicaid for families of first-time babies and families with all children in the household age 313 and under. Must register with office prior to making appointment (M-F only). Alric Quan. [facility] Odella Aquaso Henson, NP; Lynelle DoctorKnapp, MD; Susann GivensLalonde, MD; [place], [place] o  [address], [place], [place] [address] o [phone] o Mon-Fri 8:00-5:00 o Babies seen by providers at [facility] o Does NOT accept Medicaid/Commercial Insurance Only . [facility] [place] [facility]  Suzette Battiest, MD; Zachery Dauer, MD; Stefan Church, MD; Sabino Dick, MD; Quitman Livings, MD; Farris Has, MD; Gaynell Face, MD; Betha Loa, MD; Colon Flattery, MD; Clifton James, MD o [address] [place], [place] [address] o [phone] o Mon-Fri 8:30-5:30, Sat (Oct.-Mar.) 9:00-1:00 o Babies seen by providers at [facility] o Accepting [facility] [phone]) . [facility] Dimitri, MD; Sheliah Hatch, MD o [address], [place], [place] [address] o [phone] o Mon-Fri 8:30-5:00, Sat 8:30-12:00 o Providers come to see babies at [facility] o Does NOT accept Medicaid . [facility] Cindy Hazy, [place]; [place], MD; [place], [place]; Wynelle Link, MD; Azucena Cecil, MD o [address], [place], [place] [address] o [phone] o Mon-Fri 8:00-5:00 o Babies seen by providers at [facility] o Does NOT accept Medicaid o Only accepting babies of parents who are patients o Please call early in hospitalization for appointment (limited availability) . [facility] Lamar Benes, MD; Abran Cantor, MD; Early Osmond, MD; Cherre Huger, NP; Hyacinth Meeker, MD; Dwan Bolt, MD; Jarold Motto, NP; Dario Guardian, MD; Talmage Nap, MD; Maisie Fus, MD; Pricilla Holm, MD; Tama High, MD o [address], [place], [place] [address] o [phone] o Mon-Fri 8:00-5:00, Sat 9:00-12:00 o Providers come  to see babies at [facility] o Does NOT accept [facility] [phone]) . [facility] at [facility] o Limited providers accepting new patients: Drema Pry, NP; [place], [place] o [address] [place], [place] [address] o [phone] o Mon-Fri 8:00-5:00 o Babies seen by providers at [facility] o Does NOT accept Medicaid o Only accepting babies of parents who are patients o Please call early in hospitalization for appointment (limited availability) . [facility] Luan Pulling, MD; Nash Dimmer, MD o [address] [place], [place] [address] o [phone] (press 1 to schedule appointment) o Mon-Fri 8:00-5:00 o Providers come to see babies at [facility] o Does NOT accept Medicaid . [facility] Cristino Martes, MD o [address] [place], [place] [address] o [phone] o Mon-Fri 8:30-5:00 (lunch 12:30-1:00), extended hours by appointment only Wed 5:00-6:30 o Babies seen by [facility] providers o Accepting Medicaid . [facility], MD; [place], MD; Hassan Rowan, MD o [address] [place], [place], [place] [address] o [phone] o Mon-Fri 8:00-5:00 o Babies seen by [facility] providers o Does NOT accept Medicaid . Nature conservation officer at [place] [address] [place] Soho, MD; Durene Cal, MD; [place], DO o [address] [place], [place] [address] o [phone] o Mon-Fri 8:00-5:00 o Babies seen by [facility] providers o Does NOT accept Medicaid . [facility] [address] o [place], [place]; [place], [place]; [place], NP; Avis Epley, MD; Vonna Kotyk, MD; Clance Boll, MD; Stevphen Rochester, NP; Arvilla Market, NP; Ann Maki, NP; Otis Dials, NP; Vaughan Basta, MD; [place], MD o [address] [place], [place] [address] o [phone] o Mon-Fri 8:30-5:00, Sat 10:00-1:00 o Providers come to see babies at [facility] o Does NOT accept Medicaid o Free prenatal information session Tuesdays at 4:45pm . [facility] Luna Kitchens, MD;  [place], [place]; [place], [place]; [place], [place] o [address] [place] [address] o [phone] o Mon-Fri 7:30-5:30 o Babies seen by [facility] providers . [facility] o [address], [place], [place]  [address] o [phone]   Fax - [phone]  [place] ([address] [phone]) . [facility] Alphonsa Overall, MD o [address] [place], [place] [address] o [phone] o Mon-Thur 8:00-6:00 o Providers come to see babies at [facility] o Accepting Medicaid . [facility] Zenon Mayo, NP; Cyndia Bent, MD; [place], [place]; [place], [place] o [address] [place], [place] [address] o [phone] o Mon-Thur 7:30-7:30, Fri 7:30-4:30 o Babies seen by [facility] providers o Accepting Medicaid . [facility] Cheryle Horsfall, MD; Janene Harvey, NP; Vonita Moss, MD o [address], [place], [place] [address] o [phone] o Mon-Fri 8:30-5:00, Sat 8:30-12:00 o Providers come to see babies at [facility] o Accepting Medicaid o Must have "Meet & Greet" appointment at office prior to delivery . [facility] Llana Aliment, MD; Earlene Plater, MD; Lucretia Roers, MD o [address], [place], [place] [address] o [phone] o Mon-Wed 8:00-6:00, Thur-Fri 8:00-5:00, Sat 9:00-12:00 o Providers come to see babies at [facility] o Does NOT accept Medicaid o Only accepting siblings of current patients . [facility] [place]  o [address], [place], [place]  [address] o [phone]   Fax - [phone] . [facility] o [phone] [address], [place], [place]  [address] o [phone]   Fax - [phone]  [place] [phone] & [phone]) . Nature conservation officer at [facility] o [place], DO; [place], DO o [address] [place], [place] [address] o [phone] o Mon-Fri  7:00-5:00 o Babies seen by [facility] providers o Does NOT accept Medicaid . [facility] Ellis Savage, MD; [employer], [place]; [place], [place] o [address], [place], [place] [address] o [phone] o Mon-Fri 8:00-5:00 o Babies seen by [facility] providers o Accepting Medicaid . [facility] - [address] Franne Forts, MD; [place], [place]; [place], NP; [place], [place] o [address] [place], [place], [place] [address] o [phone] o Mon-Fri 8:00-5:00 o Babies seen by providers at [facility] o Accepting [facility] [place] [phone]) . [facility] [place], [place] o [address] [place], [place] [address] o [phone] o Mon-Fri 8:00-5:00 o Babies seen by [facility] providers o Does NOT accept Medicaid o Limited availability, please call early in hospitalization to schedule follow-up . [facility] Jolee Ewing, PA; Eddie Candle, MD; [place], MD; [place], [place]; Constance Goltz, MD; [place], [place] o [facility] [facility] [address], [place], [place] [address] o [phone] o Mon-Fri 8:30-5:00, Sat 9:00-12:00 o Babies seen by providers at [facility] o Accepting Medicaid o Please register online then schedule online or call office o [email or web address] . [facility]) Samuella Bruin, NP; Lucianne Muss, MD; [place] Clam, PA o [address], [place], [place] [address] o [phone] o Mon-Fri 8:00-5:00 o Babies seen by providers at [facility] o Accepting Medicaid . [facility] ([facility]) Sharin Mons, MD; Reed Breech, NP; Shelva Majestic, MD o [address], [place], [place] [address] o [phone] o Mon-Fri 8:00-5:30, Sat&Sun by appointment (phones open at 8:30) o Babies seen by [facility] providers o Accepting Medicaid o Must be a first-time baby or sibling of  current patient . [facility] Pediatrics - [facility] [address], [address], [place], [place]  [address] o [phone]   Fax - [phone]  [place] ([phone] & [phone]) . [facility] o [place], [place]; [place], [place]; Dimple Casey, MD; [place], [place]; Carolyne Fiscal, MD o [address] [place], [place] [address] o [phone] o Mon-Thur 8:00-7:00, Fri 8:00-5:00, Sat 8:00-12:00, Sun 9:00-12:00 o Babies seen by [facility] providers o Accepting Medicaid . [facility] at [facility], MD; Gaynell Face, MD; [facility], MD o [address], [place], [place] [address] o [phone] o Mon-Thur 8:00-5:00 o Babies seen by providers at [facility] o Accepting Medicaid . [facility] at [place] Gwenlyn Saran, MD; [place], MD; Madilyn Fireman, MD; Melvyn Neth, MD; List, MD; Lazarus Salines, MD; Gaynell Face, MD; Berneda Rose, MD; Flora Lipps, MD; Beryl Meager, MD; Luther Redo, MD; Lavonia Drafts, MD; Kellie Simmering, MD o [address] [employer] [place], [place] [address] o [phone] o Mon-Fri 8:00-5:30, Sat (Oct.-Mar.) 9:00-1:00 o Babies seen by providers at [facility] o Accepting Medicaid o Must fill out new patient packet, available online at [email or web address] . [facility] - [place] at [facility]) Simone Curia, NP; Tiburcio Pea, NP; Tresa Endo, NP; Whitney Post, MD; [place], [place]; Hennie Duos, MD; Wynne Dust, MD; Kavin Leech, NP o [address], [place], [place] [address] o [phone] o Mon-Thur 8:00-5:30, Fri 8:00-5:00 o Babies seen by providers at [facility] o Accepting [facility] [phone]) . [facility] o [place], [place]; [place], MD; Tanya Nones, MD; [place], [place] o [address] [place], [place] [address] o [phone] o Mon-Fri 8:00-5:00 o Babies seen by providers at [facility] o Accepting [facility] [phone]) . [facility] at [facility] o [place], DO; Lenise Arena, MD;  [place], [place] o [address], [place], [place] [address] o [phone] o Mon-Fri 8:00-5:00 o Babies seen by providers at [facility] o Does NOT accept Medicaid o Limited appointment availability, please call early in hospitalization  . Nature conservation officer at [facility] o [place], DO; [place], MD o [address] [place], [place] [address] o [phone] o Mon-Fri 8:00-5:00 o Babies seen by [facility] providers o Does NOT accept Medicaid . [facility] - [facility] Lorrine Kin, MD; Ninetta Lights, MD; [place], [place]; [place], MD o [address], [place], [place] [address] o [phone] o Mon-Fri 8:00-5:00 o After hours clinic [facility] [place], [place] [address]) [phone] Mon-Fri 5:00-8:00, Sat 12:00-6:00, Sun 10:00-4:00 o Babies seen by [facility] providers o Accepting Medicaid . [facility] at [facility] o [address] [place], [place]  [address] o [phone]   Fax - [phone]  [place] [phone]) . Nature conservation officer at [facility], MD o [address] [place], [place], [place] [address] o [phone] o Mon-Fri 8:00-5:00 o Babies seen by [facility] providers o Does NOT accept Medicaid . [facility] Family [facility] - [place] [facility] [place]) Tomi Likens, MD o [address] [place] [address], [place], [place] [address] o [phone] o Mon-Thur 8:00-7:00, Fri 8:00-5:00, Sat 8:00-12:00 o Babies seen by providers at [facility] o Accepting Medicaid - but does not have vaccinations in office (must be received elsewhere) o Limited availability, please call early in hospitalization  [place] ([phone]) . [facility]  o Wyvonne Lenz, MD o [address], [place] [address] o [phone]  Fax [phone]

## 2020-06-04 NOTE — Progress Notes (Signed)
OBSTETRICS PRENATAL VIRTUAL VISIT ENCOUNTER NOTE  Provider location: Center for [facility]   I connected with Brittney Khan on 06/04/20 at  1:35 PM EST by MyChart Video Encounter at home and verified that I am speaking with the correct person using two identifiers.   I discussed the limitations, risks, security and privacy concerns of performing an evaluation and management service virtually and the availability of in person appointments. I also discussed with the patient that there may be a patient responsible charge related to this service. The patient expressed understanding and agreed to proceed. Subjective:  Brittney Khan is a 35 y.o. G2P0010 at [redacted]w[redacted]d being seen today for ongoing prenatal care.  She is currently monitored for the following issues for this low-risk pregnancy and has Infectious colitis; Left knee pain; Nausea/vomiting in pregnancy; Supervision of low-risk pregnancy, first trimester; Rh negative state in antepartum period; and Syncope on their problem list.  Patient reports backache.  Contractions: Not present. Vag. Bleeding: None.  Movement: Present. Denies any leaking of fluid.   The following portions of the patient's history were reviewed and updated as appropriate: allergies, current medications, past family history, past medical history, past social history, past surgical history and problem list.   Objective:   Vitals:   06/04/20 1335  BP: 128/75  Pulse: 76    Fetal Status:     Movement: Present     General:  Alert, oriented and cooperative. Patient is in no acute distress.  Respiratory: Normal respiratory effort, no problems with respiration noted  Mental Status: Normal mood and affect. Normal behavior. Normal judgment and thought content.  Rest of physical exam deferred due to type of encounter  Imaging: [place] MFM OB DETAIL +14 WK  Result Date:  05/06/2020 ----------------------------------------------------------------------  OBSTETRICS REPORT                       (Signed Final 05/06/2020 09:42 am) ---------------------------------------------------------------------- Patient Info  ID #:       [id number]                          D.O.B.:  [birth year] (34 yrs)  Name:       Brittney Khan                Visit Date: 05/06/2020 08:39 am ---------------------------------------------------------------------- Performed By  Attending:        Ma Rings MD         Ref. Address:     [address]                                                             [place], [place]  [id number]  Performed By:     Ceasar LundKristin Jackson        [facility]  Referred By:      Venora MaplesMATTHEW M                    ECKSTAT MD ---------------------------------------------------------------------- Orders  #  Description                           Code        Ordered By  1  US MFM OB DETAIL +14 WK               [id number]    MATTHEW ECKSTAT ----------------------------------------------------------------------  #  Order #                     Accession #                Episode #  1  [id number]                   [id number]                 [id number] ---------------------------------------------------------------------- Indications  Fetal abnormality - other known or suspected   O35.9XX0  Neg NIPS  [redacted] weeks gestation of pregnancy                Z3A.19 ---------------------------------------------------------------------- Fetal Evaluation  Num Of Fetuses:         1  Fetal Heart Rate(bpm):  150  Cardiac Activity:       Observed  Presentation:            Cephalic  Placenta:               Posterior  P. Cord Insertion:      Visualized, central  Amniotic Fluid  AFI FV:      Within normal limits ---------------------------------------------------------------------- Biometry  BPD:      47.4  mm     G. Age:  20w 2d         91  %    CI:        74.73   %    70 - 86  FL/HC:      16.5   %    16.1 - 18.3  HC:       174   mm     G. Age:  20w 0d         79  %    HC/AC:      1.20        1.09 - 1.39  AC:      145.5  mm     G. Age:  19w 6d         70  %    FL/BPD:     60.5   %  FL:       28.7  mm     G. Age:  18w 6d         31  %    FL/AC:      19.7   %    20 - 24  HUM:      29.7  mm     G. Age:  19w 5d         66  %  CER:      19.8  mm     G. Age:  19w 1d         44  %  NFT:       4.1  mm  LV:          5  mm  CM:        4.6  mm  Est. FW:     294  gm    0 lb 10 oz      65  % ---------------------------------------------------------------------- OB History  Gravidity:    2         Term:   0        Prem:   0        SAB:   1  TOP:          0       Ectopic:  0        Living: 0 ---------------------------------------------------------------------- Gestational Age  LMP:           19w 1d        Date:  12/24/19                 EDD:   09/29/20  U/S Today:     19w 5d                                        EDD:   09/25/20  Best:          19w 1d     Det. By:  LMP  (12/24/19)          EDD:   09/29/20 ---------------------------------------------------------------------- Anatomy  Cranium:               Appears normal         LVOT:                   Appears normal  Cavum:                 Appears normal         Aortic Arch:            Appears normal  Ventricles:            Appears normal  Ductal Arch:            Appears normal  Choroid Plexus:        Appears normal         Diaphragm:              Appears normal  Cerebellum:            Appears normal         Stomach:                Appears normal, left                                                                         sided  Posterior Fossa:       Appears normal         Abdomen:                Appears normal  Nuchal Fold:           Appears normal         Abdominal Wall:         Appears nml (cord                                                                        insert, abd wall)  Face:                  Appears normal         Cord Vessels:           Appears normal (3                         (orbits and profile)                           vessel cord)  Lips:                  Appears normal         Kidneys:                Appear normal  Palate:                Not well visualized    Bladder:                Appears normal  Thoracic:              Appears normal         Spine:                  Appears normal  Heart:                 Appears normal         Upper Extremities:      Appears normal                         (  4CH, axis, and                         situs)  RVOT:                  Appears normal         Lower Extremities:      Appears normal  Other:  SVC IVC appears normal. 3VV/T appears normal. Parents do not          wish to know sex of fetus. Fetus appears to be a female. Heels and 5th          digit visualized. Nasal bone visualized. ---------------------------------------------------------------------- Cervix Uterus Adnexa  Cervix  Length:           3.39  cm.  Normal appearance by transabdominal scan.  Right Ovary  Appears normal  Left Ovary  Appears normal  Adnexa  No abnormality visualized. ---------------------------------------------------------------------- Comments  This patient was seen for a detailed fetal anatomy scan.  She denies any significant past medical history and denies  any problems in her current pregnancy.  She had a cell free DNA test earlier in her pregnancy which  indicated a low risk for trisomy 42, 63, and 13.  The patient  did not want the fetal gender revealed today.  She was informed that the fetal growth and amniotic fluid  level were appropriate for her  gestational age.  There were no obvious fetal anomalies noted on today's  ultrasound exam.  The patient was informed that anomalies may be missed due  to technical limitations. If the fetus is in a suboptimal position  or maternal habitus is increased, visualization of the fetus in  the maternal uterus may be impaired.  Follow up as indicated. ----------------------------------------------------------------------                   Ma Rings, MD Electronically Signed Final Report   05/06/2020 09:42 am ----------------------------------------------------------------------   Assessment and Plan:  Pregnancy: G2P0010 at [redacted]w[redacted]d 1. Supervision of low-risk pregnancy, first trimester Doing well Reviewed guidance for next visit for fasting glucola, TDAP and rhogam at next visit. Taking parenting classes. Requesting info on breastfeeding as still deciding on her method of feeding.  2. Back pain affecting pregnancy, antepartum History of back pain before pregnancy - low back pain midline. Given info on exercises in AVS - client says she can access AVS iin MyChart Advised to try exercise and ice to her back for 15 minutes for pain.   Preterm labor symptoms and general obstetric precautions including but not limited to vaginal bleeding, contractions, leaking of fluid and fetal movement were reviewed in detail with the patient. I discussed the assessment and treatment plan with the patient. The patient was provided an opportunity to ask questions and all were answered. The patient agreed with the plan and demonstrated an understanding of the instructions. The patient was advised to call back or seek an in-person office evaluation/go to [facility] at [facility] for any urgent or concerning symptoms. Please refer to After Visit Summary for other counseling recommendations.   I provided 9 minutes of face-to-face time during this encounter.  Return in about 4 weeks (around 07/02/2020) for early AM appt  - ROB with fasting glucola.  No future appointments. at time note was closed.  Currie Paris, NP Center for [employer], [facility]

## 2020-06-04 NOTE — Progress Notes (Signed)
I connected with  Bonnita Hollow on 06/04/20 at 1330 by MyChart and verified that I am speaking with the correct person using two identifiers.   I discussed the limitations, risks, security and privacy concerns of performing an evaluation and management service by telephone and the availability of in person appointments. I also discussed with the patient that there may be a patient responsible charge related to this service. The patient expressed understanding and agreed to proceed.  Marjo Bicker, RN 06/04/2020  1:30 PM

## 2020-06-10 ENCOUNTER — Telehealth: Payer: Self-pay | Admitting: Lactation Services

## 2020-06-10 NOTE — Telephone Encounter (Signed)
Called patient with results of [employer] Screening. Patient did not answer. My Chart message sent. Patient asked to call the office with any questions or concerns.

## 2020-06-11 ENCOUNTER — Encounter: Payer: Self-pay | Admitting: Family Medicine

## 2020-06-11 ENCOUNTER — Encounter: Payer: Self-pay | Admitting: *Deleted

## 2020-06-11 DIAGNOSIS — Z3491 Encounter for supervision of normal pregnancy, unspecified, first trimester: Secondary | ICD-10-CM

## 2020-06-11 DIAGNOSIS — Z148 Genetic carrier of other disease: Secondary | ICD-10-CM | POA: Insufficient documentation

## 2020-07-07 ENCOUNTER — Other Ambulatory Visit: Payer: No Typology Code available for payment source

## 2020-07-07 ENCOUNTER — Other Ambulatory Visit: Payer: Self-pay | Admitting: General Practice

## 2020-07-07 ENCOUNTER — Ambulatory Visit (INDEPENDENT_AMBULATORY_CARE_PROVIDER_SITE_OTHER): Payer: No Typology Code available for payment source | Admitting: Obstetrics and Gynecology

## 2020-07-07 ENCOUNTER — Other Ambulatory Visit: Payer: Self-pay

## 2020-07-07 VITALS — BP 118/84 | HR 89 | Wt 190.0 lb

## 2020-07-07 DIAGNOSIS — O26899 Other specified pregnancy related conditions, unspecified trimester: Secondary | ICD-10-CM | POA: Diagnosis not present

## 2020-07-07 DIAGNOSIS — Z3491 Encounter for supervision of normal pregnancy, unspecified, first trimester: Secondary | ICD-10-CM

## 2020-07-07 DIAGNOSIS — Z3A28 28 weeks gestation of pregnancy: Secondary | ICD-10-CM | POA: Insufficient documentation

## 2020-07-07 DIAGNOSIS — Z148 Genetic carrier of other disease: Secondary | ICD-10-CM

## 2020-07-07 DIAGNOSIS — Z6791 Unspecified blood type, Rh negative: Secondary | ICD-10-CM

## 2020-07-07 MED ORDER — RHO D IMMUNE GLOBULIN 1500 UNIT/2ML IJ SOSY
300.0000 ug | PREFILLED_SYRINGE | Freq: Once | INTRAMUSCULAR | Status: AC
  Administered 2020-07-07: 300 ug via INTRAMUSCULAR

## 2020-07-07 NOTE — Patient Instructions (Signed)
Rh0 [D] Immune Globulin injection What is this medicine? RhO [D] IMMUNE GLOBULIN (i MYOON GLOB yoo lin) is used to treat idiopathic thrombocytopenic purpura (ITP). This medicine is used in RhO negative mothers who are pregnant with a RhO positive child. It is also used after a transfusion of RhO positive blood into a RhO negative person. This medicine may be used for other purposes; ask your health care provider or pharmacist if you have questions. COMMON BRAND NAME(S): BayRho-D, HyperRHO S/D, MICRhoGAM, RhoGAM, Rhophylac, WinRho SDF What should I tell my health care provider before I take this medicine? They need to know if you have any of these conditions:  bleeding disorders  low levels of immunoglobulin A in the body  no spleen  an unusual or allergic reaction to human immune globulin, other medicines, foods, dyes, or preservatives  pregnant or trying to get pregnant  breast-feeding How should I use this medicine? This medicine is for injection into a muscle or into a vein. It is given by a health care professional in a hospital or clinic setting. Talk to your pediatrician regarding the use of this medicine in children. This medicine is not approved for use in children. Overdosage: If you think you have taken too much of this medicine contact a poison control center or emergency room at once. NOTE: This medicine is only for you. Do not share this medicine with others. What if I miss a dose? It is important not to miss your dose. Call your doctor or health care professional if you are unable to keep an appointment. What may interact with this medicine?  live virus vaccines, like measles, mumps, or rubella This list may not describe all possible interactions. Give your health care provider a list of all the medicines, herbs, non-prescription drugs, or dietary supplements you use. Also tell them if you smoke, drink alcohol, or use illegal drugs. Some items may interact with your  medicine. What should I watch for while using this medicine? This medicine is made from human blood. It may be possible to pass an infection in this medicine. Talk to your doctor about the risks and benefits of this medicine. This medicine may interfere with live virus vaccines. Before you get live virus vaccines tell your health care professional if you have received this medicine within the past 3 months. What side effects may I notice from receiving this medicine? Side effects that you should report to your doctor or health care professional as soon as possible:  allergic reactions like skin rash, itching or hives, swelling of the face, lips, or tongue  breathing problems  chest pain or tightness  yellowing of the eyes or skin Side effects that usually do not require medical attention (report to your doctor or health care professional if they continue or are bothersome):  fever  pain and tenderness at site where injected This list may not describe all possible side effects. Call your doctor for medical advice about side effects. You may report side effects to FDA at 1-800-FDA-1088. Where should I keep my medicine? This drug is given in a hospital or clinic and will not be stored at home. NOTE: This sheet is a summary. It may not cover all possible information. If you have questions about this medicine, talk to your doctor, pharmacist, or health care provider.  2021 Elsevier/Gold Standard (2007-11-20 14:06:10) Oral Glucose Tolerance Test During Pregnancy Why am I having this test? The oral glucose tolerance test (OGTT) is done to check how your  body processes blood sugar (glucose). This is one of several tests used to diagnose diabetes that develops during pregnancy (gestational diabetes mellitus). Gestational diabetes is a short-term form of diabetes that some women develop while they are pregnant. It usually occurs during the second trimester of pregnancy and goes away after  delivery. Testing, or screening, for gestational diabetes usually occurs at weeks 24-28 of pregnancy. You may have the OGTT test after having a 1-hour glucose screening test if the results from that test indicate that you may have gestational diabetes. This test may also be needed if:  You have a history of gestational diabetes.  There is a history of giving birth to very large babies or of losing pregnancies (having stillbirths).  You have signs and symptoms of diabetes, such as: ? Changes in your eyesight. ? Tingling or numbness in your hands or feet. ? Changes in hunger, thirst, and urination, and these are not explained by your pregnancy. What is being tested? This test measures the amount of glucose in your blood at different times during a period of 3 hours. This shows how well your body can process glucose. What kind of sample is taken? Blood samples are required for this test. They are usually collected by inserting a needle into a blood vessel.   How do I prepare for this test?  For 3 days before your test, eat normally. Have plenty of carbohydrate-rich foods.  Follow instructions from your health care provider about: ? Eating or drinking restrictions on the day of the test. You may be asked not to eat or drink anything other than water (to fast) starting 8-10 hours before the test. ? Changing or stopping your regular medicines. Some medicines may interfere with this test. Tell a health care provider about:  All medicines you are taking, including vitamins, herbs, eye drops, creams, and over-the-counter medicines.  Any blood disorders you have.  Any surgeries you have had.  Any medical conditions you have. What happens during the test? First, your blood glucose will be measured. This is referred to as your fasting blood glucose because you fasted before the test. Then, you will drink a glucose solution that contains a certain amount of glucose. Your blood glucose will be  measured again 1, 2, and 3 hours after you drink the solution. This test takes about 3 hours to complete. You will need to stay at the testing location during this time. During the testing period:  Do not eat or drink anything other than the glucose solution.  Do not exercise.  Do not use any products that contain nicotine or tobacco, such as cigarettes, e-cigarettes, and chewing tobacco. These can affect your test results. If you need help quitting, ask your health care provider. The testing procedure may vary among health care providers and hospitals. How are the results reported? Your results will be reported as milligrams of glucose per deciliter of blood (mg/dL) or millimoles per liter (mmol/L). There is more than one source for screening and diagnosis reference values used to diagnose gestational diabetes. Your health care provider will compare your results to normal values that were established after testing a large group of people (reference values). Reference values may vary among labs and hospitals. For this test (Carpenter-Coustan), reference values are:  Fasting: 95 mg/dL (5.3 mmol/L).  1 hour: 180 mg/dL (68.3 mmol/L).  2 hour: 155 mg/dL (8.6 mmol/L).  3 hour: 140 mg/dL (7.8 mmol/L). What do the results mean? Results below the reference values are considered  normal. If two or more of your blood glucose levels are at or above the reference values, you may be diagnosed with gestational diabetes. If only one level is high, your health care provider may suggest repeat testing or other tests to confirm a diagnosis. Talk with your health care provider about what your results mean. Questions to ask your health care provider Ask your health care provider, or the department that is doing the test:  When will my results be ready?  How will I get my results?  What are my treatment options?  What other tests do I need?  What are my next steps? Summary  The oral glucose tolerance  test (OGTT) is one of several tests used to diagnose diabetes that develops during pregnancy (gestational diabetes mellitus). Gestational diabetes is a short-term form of diabetes that some women develop while they are pregnant.  You may have the OGTT test after having a 1-hour glucose screening test if the results from that test show that you may have gestational diabetes. You may also have this test if you have any symptoms or risk factors for this type of diabetes.  Talk with your health care provider about what your results mean. This information is not intended to replace advice given to you by your health care provider. Make sure you discuss any questions you have with your health care provider. Document Revised: 08/30/2019 Document Reviewed: 08/30/2019 Elsevier Patient Education  2021 [employer].

## 2020-07-07 NOTE — Progress Notes (Signed)
   PRENATAL VISIT NOTE  Subjective:  Brittney Khan is a 35 y.o. G2P0010 at [redacted]w[redacted]d being seen today for ongoing prenatal care.  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  Patient doing well with no acute concerns today. She reports no complaints.  Contractions: Not present. Vag. Bleeding: None.  Movement: Present. Denies leaking of fluid.   The following portions of the patient's history were reviewed and updated as appropriate: allergies, current medications, past family history, past medical history, past social history, past surgical history and problem list. Problem list updated.  Objective:   Vitals:   07/07/20 0935  BP: 118/84  Pulse: 89  Weight: 190 lb (86.2 kg)    Fetal Status: Fetal Heart Rate (bpm): 156 Fundal Height: 27 cm Movement: Present     General:  Alert, oriented and cooperative. Patient is in no acute distress.  Skin: Skin is warm and dry. No rash noted.   Cardiovascular: Normal heart rate noted  Respiratory: Normal respiratory effort, no problems with respiration noted  Abdomen: Soft, gravid, appropriate for gestational age.  Pain/Pressure: Absent     Pelvic: Cervical exam deferred        Extremities: Normal range of motion.  Edema: None  Mental Status:  Normal mood and affect. Normal behavior. Normal judgment and thought content.   Assessment and Plan:  Pregnancy: G2P0010 at [redacted]w[redacted]d  1. Supervision of low-risk pregnancy, first trimester  - CHL AMB BABYSCRIPTS SCHEDULE OPTIMIZATION  2. 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 Rh negative state in antepartum period Rhogam today  4. [redacted] weeks gestation of pregnancy   Preterm labor  symptoms and general obstetric precautions including but not limited to vaginal bleeding, contractions, leaking of fluid and fetal movement were reviewed in detail with the patient.  Please refer to After Visit Summary for other counseling recommendations.   Return in about 2 weeks (around 07/21/2020) for ROB, in person.   Mariel Aloe, MD Faculty Attending Center for [facility]

## 2020-07-08 LAB — HIV ANTIBODY (ROUTINE TESTING W REFLEX): HIV Screen 4th Generation wRfx: NONREACTIVE

## 2020-07-08 LAB — GLUCOSE TOLERANCE, 2 HOURS W/ 1HR
Glucose, 1 hour: 139 mg/dL (ref 65–179)
Glucose, 2 hour: 71 mg/dL (ref 65–152)
Glucose, Fasting: 85 mg/dL (ref 65–91)

## 2020-07-08 LAB — CBC
Hematocrit: 31.7 % — ABNORMAL LOW (ref 34.0–46.6)
Hemoglobin: 10.9 g/dL — ABNORMAL LOW (ref 11.1–15.9)
MCH: 30.8 pg (ref 26.6–33.0)
MCHC: 34.4 g/dL (ref 31.5–35.7)
MCV: 90 fL (ref 79–97)
Platelets: 278 10*3/uL (ref 150–450)
RBC: 3.54 x10E6/uL — ABNORMAL LOW (ref 3.77–5.28)
RDW: 11.3 % — ABNORMAL LOW (ref 11.7–15.4)
WBC: 14.9 10*3/uL — ABNORMAL HIGH (ref 3.4–10.8)

## 2020-07-08 LAB — RPR: RPR Ser Ql: NONREACTIVE

## 2020-07-21 ENCOUNTER — Other Ambulatory Visit: Payer: Self-pay

## 2020-07-21 ENCOUNTER — Ambulatory Visit (INDEPENDENT_AMBULATORY_CARE_PROVIDER_SITE_OTHER): Payer: No Typology Code available for payment source | Admitting: Student

## 2020-07-21 DIAGNOSIS — Z3491 Encounter for supervision of normal pregnancy, unspecified, first trimester: Secondary | ICD-10-CM

## 2020-07-21 DIAGNOSIS — Z3493 Encounter for supervision of normal pregnancy, unspecified, third trimester: Secondary | ICD-10-CM

## 2020-07-21 MED ORDER — FERROUS GLUCONATE 324 (37.5 FE) MG PO TABS: 1.0000 | ORAL_TABLET | ORAL | 1 refills | Status: DC

## 2020-07-21 NOTE — Patient Instructions (Signed)
AREA PEDIATRIC/FAMILY PRACTICE PHYSICIANS  [place] ([address]) . [facility] o Chambliss, MD; Eniola, MD; Hale, MD; Hensel, MD; McDiarmid, MD; McIntyer, MD; Neal, MD; Walden, MD o [address] [place], [place] [address] o [phone] o Mon-Fri 8:30-12:30, 1:30-5:00 o Providers come to see babies at [facility] o Accepting Medicaid . [facility] o Limited providers who accept newborns: Koirala, MD; Morrow, MD; Wolters, MD o [address], [place], [place] [address] o [phone] o Mon-Fri 8:00-5:30 o Babies seen by providers at [facility] o Does NOT accept Medicaid o Please call early in hospitalization for appointment (limited availability)  . [facility] o Mulberry, MD o [address] [place], [place] [address] o [phone] o Mon, Tue, Thur, Fri 8:30-5:00, Wed 10:00-7:00 (closed 1-2pm) o Babies seen by [facility] providers o Accepting Medicaid . [facility] o Rubin, MD o [address], [place], [place] [address] o [phone] o Mon-Fri 8:30-5:00, Sat 8:30-12:00 o Provider comes to see babies at [facility] o Accepting Medicaid o Must have been referred from current patients or contacted office prior to delivery . [facility] ([facility]) o Brown, MD; Chandler, MD; Ettefagh, MD; Grant, MD; Lester, MD; McCormick, MD; McQueen, MD; Prose, MD; Simha, MD; Stanley, MD; Stryffeler, NP; Tebben, NP o [address], [place], [place] [address] o [phone] o Mon, Tue, Thur, Fri 8:30-5:30, Wed 9:30-5:30, Sat 8:30-12:30 o Babies seen by [facility] providers o Accepting Medicaid o Only accepting infants of first-time parents or siblings of current patients o Hospital discharge coordinator will make follow-up appointment . Jack Amos o [address],  [place], [place]  [address] o [phone]   Fax - [phone] . [facility] o [address], [place], [place]  [address] o Phone - [phone]   Fax - [phone] . Shilpa Gosrani o [address], [place], [place]  [address] o [phone]  [place] ([address]) .  [facility] o Bates, MD; [place], MD; Cooper, Cox, MD; MD; Davis, MD; Dovico, MD; Ettefaugh, MD; Little, MD; Lowe, MD; Keiffer, MD; Melvin, MD; Sumner, MD; Williams, MD o [address], [place], [place] [address] o [phone] o Mon-Fri 8:30-5:00 (extended evenings Mon-Thur as needed), Sat-Sun 10:00-1:00 o Providers come to see babies at [facility] o Accepting Medicaid for families of first-time babies and families with all children in the household age 3 and under. Must register with office prior to making appointment (M-F only). . [facility] o Henson, NP; Knapp, MD; Lalonde, MD; Tysinger, PA o [address] [place], [place] [address] o [phone] o Mon-Fri 8:00-5:00 o Babies seen by providers at [facility] o Does NOT accept Medicaid/Commercial Insurance Only . [facility] ([facility])  o Artis, MD; Barnes, MD; Bratton, MD; Coccaro, MD; [place] Gardner, MD; Kramer, MD; Marshall, MD; Netherton, MD; Poleto, MD; Skinner, MD o [address] [place], [place] [address] o [phone] o Mon-Fri 8:30-5:30, Sat (Oct.-Mar.) 9:00-1:00 o Babies seen by providers at [facility] o Accepting Medicaid  [place] ([address]) . [facility]  o Reid, MD; Warner, MD o [address], ,  [address] o [phone] o Mon-Fri 8:30-5:00, Sat 8:30-12:00 o Providers come to see babies at [facility] o Does NOT accept Medicaid . [facility] o Becker, PA; Hagler, MD; Scifres, PA; Sun, MD; Swayne, MD o [address],  [place], [place] [address] o [phone] o Mon-Fri 8:00-5:00 o Babies seen by providers at [facility] o Does NOT accept Medicaid o Only accepting babies of parents who are patients o Please call early in hospitalization for appointment (limited availability) . [facility] o Clark, MD; Frye, MD; Kelleher, MD; Mack, NP; Miller, MD; O'Keller, MD; Patterson, NP; Pudlo, MD; Puzio, MD; Thomas, MD; Tucker, MD; Twiselton, MD o [address], [place], [place] [address] o [phone] o Mon-Fri 8:00-5:00, Sat 9:00-12:00 o Providers come to see babies at [facility] o Does NOT accept Medicaid  [place] ([address]) . [facility] o Limited providers accepting new patients: Brake, NP; Wharton, PA o [address], [place], [place] [address] o [phone] o Mon-Fri 8:00-5:00 o Babies seen by providers at [facility] o Does NOT accept Medicaid o Only accepting babies of parents who are patients o Please call early in hospitalization for appointment (limited availability) . [facility] o Gay, MD; Quinlan, MD o [address] [place], [place] [address] o [phone] (press [phone] to schedule appointment) o Mon-Fri 8:00-5:00 o Providers come to see babies at [facility] o Does NOT accept Medicaid . [facility] o Mazer, MD o [address] [place], [place] [address] o [phone] o Mon-Fri 8:30-5:00 (lunch 12:30-1:00), extended hours by appointment only Wed 5:00-6:30 o Babies seen by [facility] providers o Accepting Medicaid . [facility] o Banks, MD; Jordan, MD; Koberlein, MD o [address], [place], [place] [address] o [phone] o Mon-Fri 8:00-5:00 o Babies seen by [facility] providers o Does NOT accept Medicaid .  [facility] o Parker, MD; Hunter, MD; Wallace, DO o [address] [place], [place]  [address] o [phone] o Mon-Fri 8:00-5:00 o Babies seen by [facility] providers o Does NOT accept Medicaid . [facility] o Brandon, PA; Brecken, PA; Christy, NP; Dees, MD; DeClaire, MD; DeWeese, MD; Hansen, NP; Mills, NP; Parrish, NP; Smoot, NP; Summer, MD; Vapne, MD o [address] [place], [place] [address] o [phone] o Mon-Fri 8:30-5:00, Sat 10:00-1:00 o Providers come to see babies at [facility] o Does NOT accept Medicaid o Free prenatal information session Tuesdays at 4:45pm . [facility] o Bouska, MD; Gordon, PA; Jeffery, PA; Weber, PA o [address] [place] [address] o [phone] o Mon-Fri 7:30-5:30 o Babies seen by [facility] providers . [facility] o [address], [place], [place]  [address] o [phone]   Fax - [phone]  [place] ([address] & [address]) . [facility] o Reese, MD o [address] [place], [place] [address] o [phone] o Mon-Thur 8:00-6:00 o Providers come to see babies at [facility] o Accepting Medicaid . [facility] o Anderson, NP; Badger, MD; Beal, PA; Spencer, PA o [address] [place], [place] [address] o [phone] o Mon-Thur 7:30-7:30, Fri 7:30-4:30 o Babies seen by [facility] providers o Accepting Medicaid . [facility] o Agbuya, MD; Klett, NP; Romgoolam, MD o [address], [place], [place] [address] o [phone] o Mon-Fri 8:30-5:00, Sat 8:30-12:00 o Providers come to see babies at [facility] o Accepting Medicaid o Must have "Meet & Greet" appointment at office prior to delivery . [facility] ([facility] o McCord,   MD; Wallace, MD; Wood, MD o [address], [place], [place] [address] o [phone] o Mon-Wed 8:00-6:00, Thur-Fri 8:00-5:00, Sat 9:00-12:00 o Providers come to  see babies at [facility] o Does NOT accept Medicaid o Only accepting siblings of current patients . [facility] [place] [facility]  o [address], [place], [place]  [address] o [phone]   Fax - [phone] . [facility] o [address], [place], [place]  [address] o [phone]   Fax - [phone]  [place] ([address] & [address]) . [facility] o Cirigliano, DO; Matthews, DO o [address] [place], [place] [address] o [phone] o Mon-Fri 7:00-5:00 o Babies seen by [facility] providers o Does NOT accept Medicaid . [facility] o Briscoe, MD; Howley, PA; Moreira, PA o [address], [place], [place] [address] o [phone] o Mon-Fri 8:00-5:00 o Babies seen by [facility] providers o Accepting Medicaid . [facility] o Boyd, MD; Church, PA; Jones, NP; Osborn, PA o [address], [place], [place] [address] o [phone] o Mon-Fri 8:00-5:00 o Babies seen by providers at [facility] o Accepting Medicaid  [place] ([address]) . [facility] [place] o Wendling, DO o [address] [place], [place] [address] o [phone] o Mon-Fri 8:00-5:00 o Babies seen by [facility] providers o Does NOT accept Medicaid o Limited availability, please call early in hospitalization to schedule follow-up . [facility] o Calderon, PA; Cummings, MD; Dillard, MD; Martin, PA; Olson, MD; VanDeven, PA o [address], [place], [place] [address] o [phone] o Mon-Fri 8:30-5:00, Sat 9:00-12:00 o Babies seen by providers at [facility] o Accepting Medicaid o Please register online then schedule online or call office o [email or web address] . [facility] ([facility] o Hunter, NP; Kumar, MD; Martin Rogers, PA o [address], [place], [place] [address] o [phone] o Mon-Fri 8:00-5:00 o Babies seen by providers at [facility] o Accepting Medicaid . [facility] ([facility] o Henderson, MD; Kristi Fleenor, NP; West, MD o [address], [place], [place] [address] o [phone] o Mon-Fri 8:00-5:30, Sat&Sun by appointment (phones open at 8:30) o Babies seen by [facility] providers o Accepting Medicaid o Must be a first-time baby or sibling of current patient . [facility] [place]  o [address], [place], [place]  [address] o [phone]   Fax - [phone]  [place] ([address]) . [facility] o Brown, PA; Cowen, PA; Rice, MD; Helton, PA; Spry, MD o [address] [place], [place] [address] o [phone] o Mon-Thur 8:00-7:00, Fri 8:00-5:00, Sat 8:00-12:00, Sun 9:00-12:00 o Babies seen by [facility] providers o Accepting Medicaid . [facility] o Coe-Goins, MD; Marshall, MD; Pierre-Louis, MD o [address] [place], [place],  [address] o [phone] o Mon-Thur 8:00-5:00 o Babies seen by providers at [facility] o Accepting Medicaid . [facility] o Bratton, MD; Coe-Goins, MD; Hayes, MD; Lewis, MD; List, MD; Lott, MD; Marshall, MD; Moran, MD; O'Neal, MD; Pierre-Louis, MD; Pitonzo, MD; Scholer, MD; Spangle, MD o [address] [place],    [address] o [phone] o Mon-Fri 8:00-5:30, Sat (Oct.-Mar.) 9:00-1:00 o Babies seen by providers at [facility] o Accepting Medicaid o Must fill out new patient packet, available online at [email or web address] . [facility] ([facility]) o Friddle, NP; Harris, NP; Kelly, NP; Logan, MD;  Melvin, PA; Poth, MD; Ramadoss, MD; Stanton, NP o [address], [place], [place] [address] o [phone] o Mon-Thur 8:00-5:30, Fri 8:00-5:00 o Babies seen by providers at [facility] o Accepting Medicaid  [place] ([address]) . [facility] o Dixon, PA; Cibola, MD; Pickard, MD; Tapia, PA o [address], [place], [place] [address] o [phone] o Mon-Fri 8:00-5:00 o Babies seen by providers at [facility] o Accepting Medicaid   [place] ([address]) . [facility] [place] o Masneri, DO; Meyers, MD; Nelson, PA o [address], [place], [place] [address] o [phone] o Mon-Fri 8:00-5:00 o Babies seen by providers at [facility] o Does NOT accept Medicaid o Limited appointment availability, please call early in hospitalization  . [facility] [place] o Kunedd, DO; McGowen, MD o [address], [place], [place] [address] o [phone] o Mon-Fri 8:00-5:00 o Babies seen by [facility] providers o Does NOT accept Medicaid . [facility] [place] o Cameron, MD; MacDonald, MD; Michaels, PA; Nayak, MD o [address] [place] [address], [place], [place] [address] o [phone] o Mon-Fri 8:00-5:00 o After hours clinic ([address] [place], [place] [address]) [phone] Mon-Fri 5:00-8:00, Sat 12:00-6:00, Sun 10:00-4:00 o Babies seen by [facility] providers o Accepting Medicaid . [facility] [place] o [address], [place], [place]  [address] o [phone]   Fax - [phone]  [place] ([address]) . [facility] [place] o Andy, MD o [address], [place], [place] [address] o [phone] o Mon-Fri 8:00-5:00 o Babies seen by [facility] providers o Does NOT accept Medicaid . [facility] [place] ([facility] [place]) o Eksir, MD o [address], [place], [place]  [address] o [phone] o Mon-Thur 8:00-7:00, Fri 8:00-5:00, Sat 8:00-12:00 o Babies seen by providers at [facility] o Accepting Medicaid - but does not have vaccinations in office (must be received elsewhere) o Limited availability, please call early in hospitalization  [place] ([address]) . [facility]  o Charlene Flemming, MD o [address], [place] [address] o [phone]  Fax [phone]   

## 2020-07-21 NOTE — Progress Notes (Signed)
   PRENATAL VISIT NOTE  Subjective:  Brittney Khan is a 35 y.o. G2P0010 at [redacted]w[redacted]d being seen today for ongoing prenatal care.  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  Patient reports feeling some dizzienss at times. She reports having enough to eat.  Contractions: Not present. Vag. Bleeding: None.  Movement: Present. Denies leaking of fluid.   The following portions of the patient's history were reviewed and updated as appropriate: allergies, current medications, past family history, past medical history, past social history, past surgical history and problem list.   Objective:   Vitals:   07/21/20 1414  BP: 126/72  Pulse: (!) 101  Weight: 191 lb (86.6 kg)    Fetal Status: Fetal Heart Rate (bpm): 148 Fundal Height: 30 cm Movement: Present     General:  Alert, oriented and cooperative. Patient is in no acute distress.  Skin: Skin is warm and dry. No rash noted.   Cardiovascular: Normal heart rate noted  Respiratory: Normal respiratory effort, no problems with respiration noted  Abdomen: Soft, gravid, appropriate for gestational age.  Pain/Pressure: Present     Pelvic: Cervical exam deferred        Extremities: Normal range of motion.  Edema: None  Mental Status: Normal mood and affect. Normal behavior. Normal judgment and thought content.   Assessment and Plan:  Pregnancy: G2P0010 at 101w0d 1. Supervision of low-risk pregnancy, first trimester -Patient will start taking iron every other day -Patient declined visit to [employer] -Reviewed glucose tolerance test results  Preterm labor symptoms and general obstetric precautions including but not limited to vaginal bleeding,  contractions, leaking of fluid and fetal movement were reviewed in detail with the patient. Please refer to After Visit Summary for other counseling recommendations.   Return in about 3 weeks (around 08/11/2020), or LROB in person in 3 weeks with KK.  Future Appointments  Date Time Provider Department Center  08/12/2020  3:55 PM Kathlene Cote [facility]    Charlesetta Garibaldi [place], [place]

## 2020-08-12 ENCOUNTER — Ambulatory Visit (INDEPENDENT_AMBULATORY_CARE_PROVIDER_SITE_OTHER): Payer: Medicaid Other | Admitting: Medical

## 2020-08-12 ENCOUNTER — Encounter: Payer: Self-pay | Admitting: Medical

## 2020-08-12 ENCOUNTER — Other Ambulatory Visit: Payer: Self-pay

## 2020-08-12 VITALS — BP 116/76 | HR 101 | Wt 191.2 lb

## 2020-08-12 DIAGNOSIS — Z6791 Unspecified blood type, Rh negative: Secondary | ICD-10-CM

## 2020-08-12 DIAGNOSIS — Z3491 Encounter for supervision of normal pregnancy, unspecified, first trimester: Secondary | ICD-10-CM | POA: Diagnosis not present

## 2020-08-12 DIAGNOSIS — Z23 Encounter for immunization: Secondary | ICD-10-CM | POA: Diagnosis not present

## 2020-08-12 DIAGNOSIS — Z3A33 33 weeks gestation of pregnancy: Secondary | ICD-10-CM

## 2020-08-12 DIAGNOSIS — Z148 Genetic carrier of other disease: Secondary | ICD-10-CM

## 2020-08-12 DIAGNOSIS — O26899 Other specified pregnancy related conditions, unspecified trimester: Secondary | ICD-10-CM

## 2020-08-12 NOTE — Progress Notes (Signed)
   PRENATAL VISIT NOTE  Subjective:  Brittney Khan is a 35 y.o. G2P0010 at [redacted]w[redacted]d being seen today for ongoing prenatal care.  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  Patient reports no complaints.  Contractions: Not present. Vag. Bleeding: None.  Movement: Present. Denies leaking of fluid.   The following portions of the patient's history were reviewed and updated as appropriate: allergies, current medications, past family history, past medical history, past social history, past surgical history and problem list.   Objective:   Vitals:   08/12/20 1559  BP: 116/76  Pulse: (!) 101  Weight: 191 lb 3.2 oz (86.7 kg)    Fetal Status: Fetal Heart Rate (bpm): 152 Fundal Height: 33 cm Movement: Present     General:  Alert, oriented and cooperative. Patient is in no acute distress.  Skin: Skin is warm and dry. No rash noted.   Cardiovascular: Normal heart rate noted  Respiratory: Normal respiratory effort, no problems with respiration noted  Abdomen: Soft, gravid, appropriate for gestational age.  Pain/Pressure: Absent     Pelvic: Cervical exam deferred        Extremities: Normal range of motion.  Edema: None  Mental Status: Normal mood and affect. Normal behavior. Normal judgment and thought content.   Assessment and Plan:  Pregnancy: G2P0010 at [redacted]w[redacted]d 1. Supervision of low-risk pregnancy, first trimester - Tdap vaccine greater than or equal to 7yo IM - Discussed MOC, still unsure - Discussed Peds, still unsure, encouraged choosing Peds by 35 weeks   2. Rh negative state in antepartum period - given previously   3. 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 [redacted] weeks gestation of pregnancy  Preterm labor symptoms and  general obstetric precautions including but not limited to vaginal bleeding, contractions, leaking of fluid and fetal movement were reviewed in detail with the patient. Please refer to After Visit Summary for other counseling recommendations.   Return in about 2 weeks (around 08/26/2020) for LOB, In-Person.  No future appointments.  Vonzella Nipple, PA-C

## 2020-08-12 NOTE — Patient Instructions (Signed)
Fetal Movement Counts Patient Name: ________________________________________________ Patient Due Date: ____________________  What is a fetal movement count? A fetal movement count is the number of times that you feel your baby move during a certain amount of time. This may also be called a fetal kick count. A fetal movement count is recommended for every pregnant woman. You may be asked to start counting fetal movements as early as week 28 of your pregnancy. Pay attention to when your baby is most active. You may notice your baby's sleep and wake cycles. You may also notice things that make your baby move more. You should do a fetal movement count:  When your baby is normally most active.  At the same time each day. A good time to count movements is while you are resting, after having something to eat and drink. How do I count fetal movements? 1. Find a quiet, comfortable area. Sit, or lie down on your side. 2. Write down the date, the start time and stop time, and the number of movements that you felt between those two times. Take this information with you to your health care visits. 3. Write down your start time when you feel the first movement. 4. Count kicks, flutters, swishes, rolls, and jabs. You should feel at least 10 movements. 5. You may stop counting after you have felt 10 movements, or if you have been counting for 2 hours. Write down the stop time. 6. If you do not feel 10 movements in 2 hours, contact your health care provider for further instructions. Your health care provider may want to do additional tests to assess your baby's well-being. Contact a health care provider if:  You feel fewer than 10 movements in 2 hours.  Your baby is not moving like he or she usually does. Date: ____________ Start time: ____________ Stop time: ____________ Movements: ____________ Date: ____________ Start time: ____________ Stop time: ____________ Movements: ____________ Date: ____________  Start time: ____________ Stop time: ____________ Movements: ____________ Date: ____________ Start time: ____________ Stop time: ____________ Movements: ____________ Date: ____________ Start time: ____________ Stop time: ____________ Movements: ____________ Date: ____________ Start time: ____________ Stop time: ____________ Movements: ____________ Date: ____________ Start time: ____________ Stop time: ____________ Movements: ____________ Date: ____________ Start time: ____________ Stop time: ____________ Movements: ____________ Date: ____________ Start time: ____________ Stop time: ____________ Movements: ____________ This information is not intended to replace advice given to you by your health care provider. Make sure you discuss any questions you have with your health care provider. Document Revised: 11/09/2018 Document Reviewed: 11/09/2018 Elsevier Patient Education  2021 Elsevier Inc. Round Ligament Pain  The round ligament is a cord of muscle and tissue that helps support the uterus. It can become a source of pain during pregnancy if it becomes stretched or twisted as the baby grows. The pain usually begins in the second trimester (13-28 weeks) of pregnancy, and it can come and go until the baby is delivered. It is not a serious problem, and it does not cause harm to the baby. Round ligament pain is usually a short, sharp, and pinching pain, but it can also be a dull, lingering, and aching pain. The pain is felt in the lower side of the abdomen or in the groin. It usually starts deep in the groin and moves up to the outside of the hip area. The pain may occur when you:  Suddenly change position, such as quickly going from a sitting to standing position.  Roll over in bed.  Cough or   sneeze.  Do physical activity. Follow these instructions at home:  Watch your condition for any changes.  When the pain starts, relax. Then try any of these methods to help with the pain: ? Sitting  down. ? Flexing your knees up to your abdomen. ? Lying on your side with one pillow under your abdomen and another pillow between your legs. ? Sitting in a warm bath for 15-20 minutes or until the pain goes away.  Take over-the-counter and prescription medicines only as told by your health care provider.  Move slowly when you sit down or stand up.  Avoid long walks if they cause pain.  Stop or reduce your physical activities if they cause pain.  Keep all follow-up visits as told by your health care provider. This is important.   Contact a health care provider if:  Your pain does not go away with treatment.  You feel pain in your back that you did not have before.  Your medicine is not helping. Get help right away if:  You have a fever or chills.  You develop uterine contractions.  You have vaginal bleeding.  You have nausea or vomiting.  You have diarrhea.  You have pain when you urinate. Summary  Round ligament pain is felt in the lower abdomen or groin. It is usually a short, sharp, and pinching pain. It can also be a dull, lingering, and aching pain.  This pain usually begins in the second trimester (13-28 weeks). It occurs because the uterus is stretching with the growing baby, and it is not harmful to the baby.  You may notice the pain when you suddenly change position, when you cough or sneeze, or during physical activity.  Relaxing, flexing your knees to your abdomen, lying on one side, or taking a warm bath may help to get rid of the pain.  Get help from your health care provider if the pain does not go away or if you have vaginal bleeding, nausea, vomiting, diarrhea, or painful urination. This information is not intended to replace advice given to you by your health care provider. Make sure you discuss any questions you have with your health care provider. Document Revised: 09/07/2017 Document Reviewed: 09/07/2017 Elsevier Patient Education  2021 Elsevier  Inc. Rosen's Emergency Medicine: Concepts and Clinical Practice (9th ed., pp. 2296- 2312). Elsevier.">  Braxton Hicks Contractions Contractions of the uterus can occur throughout pregnancy, but they are not always a sign that you are in labor. You may have practice contractions called Braxton Hicks contractions. These false labor contractions are sometimes confused with true labor. What are Deberah Pelton contractions? Braxton Hicks contractions are tightening movements that occur in the muscles of the uterus before labor. Unlike true labor contractions, these contractions do not result in opening (dilation) and thinning of the cervix. Toward the end of pregnancy (32-34 weeks), Braxton Hicks contractions can happen more often and may become stronger. These contractions are sometimes difficult to tell apart from true labor because they can be very uncomfortable. You should not feel embarrassed if you go to the hospital with false labor. Sometimes, the only way to tell if you are in true labor is for your health care provider to look for changes in the cervix. The health care provider will do a physical exam and may monitor your contractions. If you are not in true labor, the exam should show that your cervix is not dilating and your water has not broken. If there are no other health problems  associated with your pregnancy, it is completely safe for you to be sent home with false labor. You may continue to have Braxton Hicks contractions until you go into true labor. How to tell the difference between true labor and false labor True labor  Contractions last 30-70 seconds.  Contractions become very regular.  Discomfort is usually felt in the top of the uterus, and it spreads to the lower abdomen and low back.  Contractions do not go away with walking.  Contractions usually become more intense and increase in frequency.  The cervix dilates and gets thinner. False labor  Contractions are usually  shorter and not as strong as true labor contractions.  Contractions are usually irregular.  Contractions are often felt in the front of the lower abdomen and in the groin.  Contractions may go away when you walk around or change positions while lying down.  Contractions get weaker and are shorter-lasting as time goes on.  The cervix usually does not dilate or become thin. Follow these instructions at home:  Take over-the-counter and prescription medicines only as told by your health care provider.  Keep up with your usual exercises and follow other instructions from your health care provider.  Eat and drink lightly if you think you are going into labor.  If Braxton Hicks contractions are making you uncomfortable: ? Change your position from lying down or resting to walking, or change from walking to resting. ? Sit and rest in a tub of warm water. ? Drink enough fluid to keep your urine pale yellow. Dehydration may cause these contractions. ? Do slow and deep breathing several times an hour.  Keep all follow-up prenatal visits as told by your health care provider. This is important.   Contact a health care provider if:  You have a fever.  You have continuous pain in your abdomen. Get help right away if:  Your contractions become stronger, more regular, and closer together.  You have fluid leaking or gushing from your vagina.  You pass blood-tinged mucus (bloody show).  You have bleeding from your vagina.  You have low back pain that you never had before.  You feel your baby's head pushing down and causing pelvic pressure.  Your baby is not moving inside you as much as it used to. Summary  Contractions that occur before labor are called Braxton Hicks contractions, false labor, or practice contractions.  Braxton Hicks contractions are usually shorter, weaker, farther apart, and less regular than true labor contractions. True labor contractions usually become  progressively stronger and regular, and they become more frequent.  Manage discomfort from [facility] contractions by changing position, resting in a warm bath, drinking plenty of water, or practicing deep breathing. This information is not intended to replace advice given to you by your health care provider. Make sure you discuss any questions you have with your health care provider. Document Revised: 03/04/2017 Document Reviewed: 08/05/2016 Elsevier Patient Education  2021 [employer].

## 2020-08-19 ENCOUNTER — Encounter: Payer: Self-pay | Admitting: Medical

## 2020-08-20 ENCOUNTER — Other Ambulatory Visit: Payer: Self-pay

## 2020-08-20 DIAGNOSIS — O99891 Other specified diseases and conditions complicating pregnancy: Secondary | ICD-10-CM

## 2020-08-20 MED ORDER — CYCLOBENZAPRINE HCL 10 MG PO TABS: 10.0000 mg | ORAL_TABLET | Freq: Three times a day (TID) | ORAL | 0 refills | Status: DC | PRN

## 2020-08-28 ENCOUNTER — Other Ambulatory Visit: Payer: Self-pay

## 2020-08-28 ENCOUNTER — Ambulatory Visit (INDEPENDENT_AMBULATORY_CARE_PROVIDER_SITE_OTHER): Payer: No Typology Code available for payment source | Admitting: Family Medicine

## 2020-08-28 ENCOUNTER — Encounter: Payer: Self-pay | Admitting: Family Medicine

## 2020-08-28 VITALS — BP 130/79 | HR 93 | Wt 197.6 lb

## 2020-08-28 DIAGNOSIS — Z3493 Encounter for supervision of normal pregnancy, unspecified, third trimester: Secondary | ICD-10-CM

## 2020-08-28 DIAGNOSIS — Z3491 Encounter for supervision of normal pregnancy, unspecified, first trimester: Secondary | ICD-10-CM

## 2020-08-28 NOTE — Progress Notes (Signed)
   PRENATAL VISIT NOTE  Subjective:  Brittney Khan is a 35 y.o. G2P0010 at [redacted]w[redacted]d being seen today for ongoing prenatal care.  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  Patient reports backache. She states that the lower back pain started during the third trimester and has been worsening. She describes the pain as 7/10, constant and unrelieved with Tylenol. She was prescribed an oral muscle relaxer but has only taken it once because of significant sedation. No associated neuro symptoms present at time of visit. Contractions: Not present. Vag. Bleeding: None.  Movement: Present. Denies leaking of fluid.   The following portions of the patient's history were reviewed and updated as appropriate: allergies, current medications, past family history, past medical history, past social history, past surgical history and problem list.   Objective:   Vitals:   08/28/20 1535  BP: 130/79  Pulse: 93  Weight: 197 lb 9.6 oz (89.6 kg)    Fetal Status: Fetal Heart Rate (bpm): 138 Fundal Height: 33 cm Movement: Present     General:  Alert, oriented and cooperative. Patient is in no acute distress.  Skin: Skin is warm and dry. No rash noted.   Cardiovascular: Normal heart rate noted  Respiratory: Normal respiratory effort, no problems with respiration noted  Abdomen: Soft, gravid, appropriate for gestational age.  Pain/Pressure: Present     Pelvic: Cervical exam deferred        Extremities: Normal range of motion.  Edema: None  Mental Status: Normal mood and affect. Normal behavior. Normal judgment and thought content.   Assessment and Plan:   #Pregnancy: G2P0010 at [redacted]w[redacted]d  #Routine 3rd trimester prenatal care: Return in 1 week for  routine 36 week care and testing  #Back pain: counseled on proper posturing, compresses and potential chiropractor visit  Preterm labor symptoms and general obstetric precautions including but not limited to vaginal bleeding, contractions, leaking of fluid and fetal movement were reviewed in detail with the patient. Please refer to After Visit Summary for other counseling recommendations.   Return in about 1 week (around 09/04/2020) for Routine prenatal care, 36wks.   No future appointments.  Donnetta Hail, Medical Student

## 2020-09-04 ENCOUNTER — Other Ambulatory Visit (HOSPITAL_COMMUNITY)
Admission: RE | Admit: 2020-09-04 | Discharge: 2020-09-04 | Disposition: A | Discharge status: Home / Self Care | Payer: Medicaid Other | Source: Ambulatory Visit | Attending: Obstetrics and Gynecology | Admitting: Obstetrics and Gynecology

## 2020-09-04 ENCOUNTER — Ambulatory Visit (INDEPENDENT_AMBULATORY_CARE_PROVIDER_SITE_OTHER): Payer: No Typology Code available for payment source | Admitting: Obstetrics and Gynecology

## 2020-09-04 ENCOUNTER — Other Ambulatory Visit: Payer: Self-pay

## 2020-09-04 VITALS — BP 127/81 | HR 88 | Wt 196.6 lb

## 2020-09-04 DIAGNOSIS — R12 Heartburn: Secondary | ICD-10-CM

## 2020-09-04 DIAGNOSIS — O26893 Other specified pregnancy related conditions, third trimester: Secondary | ICD-10-CM

## 2020-09-04 DIAGNOSIS — Z3491 Encounter for supervision of normal pregnancy, unspecified, first trimester: Secondary | ICD-10-CM

## 2020-09-04 DIAGNOSIS — Z3A36 36 weeks gestation of pregnancy: Secondary | ICD-10-CM

## 2020-09-04 MED ORDER — PANTOPRAZOLE SODIUM 20 MG PO TBEC: 20.0000 mg | DELAYED_RELEASE_TABLET | Freq: Two times a day (BID) | ORAL | 0 refills | Status: DC

## 2020-09-04 NOTE — Progress Notes (Signed)
   PRENATAL VISIT NOTE  Subjective:  Brittney Khan is a 35 y.o. G2P0010 at [redacted]w[redacted]d being seen today for ongoing prenatal care.  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  Patient reports GERD.  Contractions: Not present. Vag. Bleeding: None.  Movement: Present. Denies leaking of fluid.   The following portions of the patient's history were reviewed and updated as appropriate: allergies, current medications, past family history, past medical history, past social history, past surgical history and problem list.   Objective:   Vitals:   09/04/20 1043  BP: 127/81  Pulse: 88  Weight: 196 lb 9.6 oz (89.2 kg)    Fetal Status: Fetal Heart Rate (bpm): 140 Fundal Height: 36 cm Movement: Present  Presentation: Vertex  General:  Alert, oriented and cooperative. Patient is in no acute distress.  Skin: Skin is warm and dry. No rash noted.   Cardiovascular: Normal heart rate noted  Respiratory: Normal respiratory effort, no problems with respiration noted  Abdomen: Soft, gravid, appropriate for gestational age.  Pain/Pressure: Present     Pelvic: Cervical exam deferred        Extremities: Normal range of motion.  Edema: None  Mental Status: Normal mood and affect. Normal behavior. Normal judgment and thought content.   Assessment and Plan:  Pregnancy: G2P0010 at [redacted]w[redacted]d 1. Supervision of low-risk pregnancy, first trimester Routine care - GC/Chlamydia probe amp ([place])not at [facility] - Culture, beta strep (group b only)  2. [redacted] weeks gestation of pregnancy  3. Heartburn during pregnancy in third trimester protonix sent in   Preterm labor symptoms and general obstetric precautions including but not limited to vaginal bleeding, contractions, leaking of fluid  and fetal movement were reviewed in detail with the patient. Please refer to After Visit Summary for other counseling recommendations.   Return in about 1 week (around 09/11/2020) for low risk ob, md or app, in person.  Future Appointments  Date Time Provider Department Center  09/15/2020 10:40 AM Marylene Land, CNM [facility]    Osburn Bing, MD

## 2020-09-05 LAB — GC/CHLAMYDIA PROBE AMP (~~LOC~~) NOT AT ARMC
Chlamydia: NEGATIVE
Comment: NEGATIVE
Comment: NORMAL
Neisseria Gonorrhea: NEGATIVE

## 2020-09-08 LAB — CULTURE, BETA STREP (GROUP B ONLY): Strep Gp B Culture: NEGATIVE

## 2020-09-15 ENCOUNTER — Ambulatory Visit (INDEPENDENT_AMBULATORY_CARE_PROVIDER_SITE_OTHER): Payer: No Typology Code available for payment source | Admitting: Student

## 2020-09-15 ENCOUNTER — Other Ambulatory Visit: Payer: Self-pay

## 2020-09-15 VITALS — BP 132/87 | HR 87 | Wt 201.6 lb

## 2020-09-15 DIAGNOSIS — Z3A38 38 weeks gestation of pregnancy: Secondary | ICD-10-CM

## 2020-09-15 DIAGNOSIS — O219 Vomiting of pregnancy, unspecified: Secondary | ICD-10-CM

## 2020-09-15 LAB — CBC
Hematocrit: 32 % — ABNORMAL LOW (ref 34.0–46.6)
Hemoglobin: 10.4 g/dL — ABNORMAL LOW (ref 11.1–15.9)
MCH: 28.4 pg (ref 26.6–33.0)
MCHC: 32.5 g/dL (ref 31.5–35.7)
MCV: 87 fL (ref 79–97)
Platelets: 272 10*3/uL (ref 150–450)
RBC: 3.66 x10E6/uL — ABNORMAL LOW (ref 3.77–5.28)
RDW: 12 % (ref 11.7–15.4)
WBC: 12.3 10*3/uL — ABNORMAL HIGH (ref 3.4–10.8)

## 2020-09-15 LAB — COMPREHENSIVE METABOLIC PANEL
ALT: 36 IU/L — ABNORMAL HIGH (ref 0–32)
AST: 35 IU/L (ref 0–40)
Albumin/Globulin Ratio: 1.3 (ref 1.2–2.2)
Albumin: 3.3 g/dL — ABNORMAL LOW (ref 3.8–4.8)
Alkaline Phosphatase: 278 IU/L — ABNORMAL HIGH (ref 44–121)
BUN/Creatinine Ratio: 8 — ABNORMAL LOW (ref 9–23)
BUN: 6 mg/dL (ref 6–20)
Bilirubin Total: 0.3 mg/dL (ref 0.0–1.2)
CO2: 17 mmol/L — ABNORMAL LOW (ref 20–29)
Calcium: 9.1 mg/dL (ref 8.7–10.2)
Chloride: 105 mmol/L (ref 96–106)
Creatinine, Ser: 0.78 mg/dL (ref 0.57–1.00)
Globulin, Total: 2.6 g/dL (ref 1.5–4.5)
Glucose: 68 mg/dL (ref 65–99)
Potassium: 3.8 mmol/L (ref 3.5–5.2)
Sodium: 136 mmol/L (ref 134–144)
Total Protein: 5.9 g/dL — ABNORMAL LOW (ref 6.0–8.5)
eGFR: 103 mL/min/{1.73_m2} (ref >59)

## 2020-09-15 NOTE — Progress Notes (Signed)
Pt states having headaches x 2 days, has not taken Tylenol. Swelling in feet as trace today started x 1 week ago. Pt states seeing spots in vision x 1 week. Also vomited x 1 today.

## 2020-09-15 NOTE — Progress Notes (Signed)
   PRENATAL VISIT NOTE  Subjective:  Brittney Khan is a 35 y.o. G2P0010 at [redacted]w[redacted]d being seen today for ongoing prenatal care.  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  Patient reports fatigue, nausea, and patient reports that she has not eaten well today. She feels "blah". Patient also reports elevated BP at home today; 150/80s but patient reports that she was active today and today she was nauseated and had vomited .  Contractions: Irritability. Vag. Bleeding: None.  Movement: Present. Denies leaking of fluid.   The following portions of the patient's history were reviewed and updated as appropriate: allergies, current medications, past family history, past medical history, past social history, past surgical history and problem list.   Objective:   Vitals:   09/15/20 1050 09/15/20 1128  BP: 126/87 132/87  Pulse: 69 87  Weight: 201 lb 9.6 oz (91.4 kg)     Fetal Status: Fetal Heart Rate (bpm): 128 Fundal Height: 38 cm Movement: Present     General:  Alert, oriented and cooperative. Patient is in no acute distress.  Skin: Skin is warm and dry. No rash noted.   Cardiovascular: Normal heart rate noted  Respiratory: Normal respiratory effort, no problems with respiration noted  Abdomen: Soft, gravid, appropriate for gestational age.  Pain/Pressure: Present     Pelvic: Cervical exam deferred        Extremities: Normal range of motion.  Edema: Trace  Mental Status: Normal mood and affect. Normal behavior. Normal judgment and thought content.   Assessment and Plan:  Pregnancy: G2P0010 at [redacted]w[redacted]d  1. Nausea/vomiting in pregnancy   2. [redacted] weeks gestation of pregnancy   -talk about birth control next visit -Patient advised to rest, drink water and eat  regularly to avoid feeling hypoglycemic, nauseated and weak -will draw pre-e labs today -Patient is currently asymptomatic (no HA, blurry vision, floating spots, RUQ pain) at this time. Explained that lab work will tell us if her condition is worsening -BP taken twice at visit and both times normal; similar to prior BPs -will discuss antenatal testing and IOL methods at 39 weeks   Term labor symptoms and general obstetric precautions including but not limited to vaginal bleeding, contractions, leaking of fluid and fetal movement were reviewed in detail with the patient. Please refer to After Visit Summary for other counseling recommendations.   Return in about 1 week (around 09/22/2020), or One week with KK.  Future Appointments  Date Time Provider Department Center  09/25/2020  3:15 PM Madlyn Frankel [facility]    Charlesetta Garibaldi [place], [place]

## 2020-09-16 LAB — PROTEIN / CREATININE RATIO, URINE
Creatinine, Urine: 264.9 mg/dL
Protein, Ur: 26 mg/dL
Protein/Creat Ratio: 98 mg/g creat (ref 0–200)

## 2020-09-24 ENCOUNTER — Telehealth: Payer: Self-pay | Admitting: Pediatrics

## 2020-09-24 ENCOUNTER — Inpatient Hospital Stay (HOSPITAL_COMMUNITY): Payer: No Typology Code available for payment source | Admitting: Anesthesiology

## 2020-09-24 ENCOUNTER — Inpatient Hospital Stay (HOSPITAL_COMMUNITY)
Admission: AD | Admit: 2020-09-24 | Discharge: 2020-09-27 | DRG: 806 | Disposition: A | Discharge status: Home / Self Care | Payer: No Typology Code available for payment source | Attending: Obstetrics and Gynecology | Admitting: Obstetrics and Gynecology

## 2020-09-24 ENCOUNTER — Other Ambulatory Visit: Payer: Self-pay

## 2020-09-24 ENCOUNTER — Encounter (HOSPITAL_COMMUNITY): Payer: Self-pay | Admitting: Obstetrics and Gynecology

## 2020-09-24 DIAGNOSIS — O134 Gestational [pregnancy-induced] hypertension without significant proteinuria, complicating childbirth: Secondary | ICD-10-CM | POA: Diagnosis present

## 2020-09-24 DIAGNOSIS — O9081 Anemia of the puerperium: Secondary | ICD-10-CM | POA: Diagnosis not present

## 2020-09-24 DIAGNOSIS — D62 Acute posthemorrhagic anemia: Secondary | ICD-10-CM | POA: Diagnosis not present

## 2020-09-24 DIAGNOSIS — O4292 Full-term premature rupture of membranes, unspecified as to length of time between rupture and onset of labor: Principal | ICD-10-CM | POA: Diagnosis present

## 2020-09-24 DIAGNOSIS — O26893 Other specified pregnancy related conditions, third trimester: Secondary | ICD-10-CM | POA: Diagnosis present

## 2020-09-24 DIAGNOSIS — O139 Gestational [pregnancy-induced] hypertension without significant proteinuria, unspecified trimester: Secondary | ICD-10-CM

## 2020-09-24 DIAGNOSIS — Z3A39 39 weeks gestation of pregnancy: Secondary | ICD-10-CM | POA: Diagnosis not present

## 2020-09-24 DIAGNOSIS — Z87891 Personal history of nicotine dependence: Secondary | ICD-10-CM | POA: Diagnosis not present

## 2020-09-24 DIAGNOSIS — Z20822 Contact with and (suspected) exposure to covid-19: Secondary | ICD-10-CM | POA: Diagnosis present

## 2020-09-24 DIAGNOSIS — O99893 Other specified diseases and conditions complicating puerperium: Secondary | ICD-10-CM | POA: Diagnosis not present

## 2020-09-24 DIAGNOSIS — Z6791 Unspecified blood type, Rh negative: Secondary | ICD-10-CM | POA: Diagnosis not present

## 2020-09-24 DIAGNOSIS — O99891 Other specified diseases and conditions complicating pregnancy: Secondary | ICD-10-CM

## 2020-09-24 DIAGNOSIS — R519 Headache, unspecified: Secondary | ICD-10-CM | POA: Diagnosis not present

## 2020-09-24 DIAGNOSIS — O26899 Other specified pregnancy related conditions, unspecified trimester: Secondary | ICD-10-CM

## 2020-09-24 LAB — POCT FERN TEST
POCT Fern Test: NEGATIVE
POCT Fern Test: POSITIVE

## 2020-09-24 LAB — PROTEIN / CREATININE RATIO, URINE
Creatinine, Urine: 208.29 mg/dL
Protein Creatinine Ratio: 0.06 mg/mg{Cre} (ref 0.00–0.15)
Total Protein, Urine: 13 mg/dL

## 2020-09-24 LAB — CBC
HCT: 31 % — ABNORMAL LOW (ref 36.0–46.0)
HCT: 30.8 % — ABNORMAL LOW (ref 36.0–46.0)
Hemoglobin: 10.1 g/dL — ABNORMAL LOW (ref 12.0–15.0)
Hemoglobin: 10.2 g/dL — ABNORMAL LOW (ref 12.0–15.0)
MCH: 28.6 pg (ref 26.0–34.0)
MCH: 28.6 pg (ref 26.0–34.0)
MCHC: 32.6 g/dL (ref 30.0–36.0)
MCHC: 33.1 g/dL (ref 30.0–36.0)
MCV: 87.8 fL (ref 80.0–100.0)
MCV: 86.3 fL (ref 80.0–100.0)
Platelets: 300 10*3/uL (ref 150–400)
Platelets: 278 10*3/uL (ref 150–400)
RBC: 3.53 MIL/uL — ABNORMAL LOW (ref 3.87–5.11)
RBC: 3.57 MIL/uL — ABNORMAL LOW (ref 3.87–5.11)
RDW: 12.4 % (ref 11.5–15.5)
RDW: 12.2 % (ref 11.5–15.5)
WBC: 13.3 10*3/uL — ABNORMAL HIGH (ref 4.0–10.5)
WBC: 17.9 10*3/uL — ABNORMAL HIGH (ref 4.0–10.5)
nRBC: 0.2 % (ref 0.0–0.2)
nRBC: 0 % (ref 0.0–0.2)

## 2020-09-24 LAB — COMPREHENSIVE METABOLIC PANEL
ALT: 16 U/L (ref 0–44)
AST: 17 U/L (ref 15–41)
Albumin: 2.7 g/dL — ABNORMAL LOW (ref 3.5–5.0)
Alkaline Phosphatase: 261 U/L — ABNORMAL HIGH (ref 38–126)
Anion gap: 8 (ref 5–15)
BUN: <5 mg/dL — ABNORMAL LOW (ref 6–20)
CO2: 21 mmol/L — ABNORMAL LOW (ref 22–32)
Calcium: 9 mg/dL (ref 8.9–10.3)
Chloride: 107 mmol/L (ref 98–111)
Creatinine, Ser: 0.68 mg/dL (ref 0.44–1.00)
GFR, Estimated: >60 mL/min (ref >60)
Glucose, Bld: 85 mg/dL (ref 70–99)
Potassium: 3.5 mmol/L (ref 3.5–5.1)
Sodium: 136 mmol/L (ref 135–145)
Total Bilirubin: 0.8 mg/dL (ref 0.3–1.2)
Total Protein: 6 g/dL — ABNORMAL LOW (ref 6.5–8.1)

## 2020-09-24 LAB — RPR: RPR Ser Ql: NONREACTIVE

## 2020-09-24 LAB — RESP PANEL BY RT-PCR (FLU A&B, COVID) ARPGX2
Influenza A by PCR: NEGATIVE
Influenza B by PCR: NEGATIVE
SARS Coronavirus 2 by RT PCR: NEGATIVE

## 2020-09-24 MED ORDER — PHENYLEPHRINE 40 MCG/ML (10ML) SYRINGE FOR IV PUSH (FOR BLOOD PRESSURE SUPPORT): 80.0000 ug | PREFILLED_SYRINGE | INTRAVENOUS | Status: DC | PRN

## 2020-09-24 MED ORDER — SOD CITRATE-CITRIC ACID 500-334 MG/5ML PO SOLN: 30.0000 mL | ORAL | Status: DC | PRN

## 2020-09-24 MED ORDER — FENTANYL-BUPIVACAINE-NACL 0.5-0.125-0.9 MG/250ML-% EP SOLN
12.0000 mL/h | EPIDURAL | Status: DC | PRN
Start: 2020-09-24 — End: 2020-09-25
  Administered 2020-09-24: 12 mL/h via EPIDURAL
  Filled 2020-09-24: qty 250

## 2020-09-24 MED ORDER — DIPHENHYDRAMINE HCL 50 MG/ML IJ SOLN: 12.5000 mg | INTRAMUSCULAR | Status: DC | PRN

## 2020-09-24 MED ORDER — EPHEDRINE 5 MG/ML INJ: 10.0000 mg | INTRAVENOUS | Status: DC | PRN

## 2020-09-24 MED ORDER — OXYTOCIN BOLUS FROM INFUSION
333.0000 mL | Freq: Once | INTRAVENOUS | Status: AC
  Administered 2020-09-25: 333 mL via INTRAVENOUS

## 2020-09-24 MED ORDER — TERBUTALINE SULFATE 1 MG/ML IJ SOLN: 0.2500 mg | Freq: Once | INTRAMUSCULAR | Status: DC | PRN

## 2020-09-24 MED ORDER — ONDANSETRON HCL 4 MG/2ML IJ SOLN: 4.0000 mg | Freq: Four times a day (QID) | INTRAMUSCULAR | Status: DC | PRN

## 2020-09-24 MED ORDER — LIDOCAINE HCL (PF) 1 % IJ SOLN: 30.0000 mL | INTRAMUSCULAR | Status: DC | PRN

## 2020-09-24 MED ORDER — FENTANYL CITRATE (PF) 100 MCG/2ML IJ SOLN
100.0000 ug | INTRAMUSCULAR | Status: DC | PRN
  Administered 2020-09-24 (×2): 100 ug via INTRAVENOUS
  Filled 2020-09-24 (×2): qty 2

## 2020-09-24 MED ORDER — LACTATED RINGERS IV SOLN: INTRAVENOUS | Status: DC

## 2020-09-24 MED ORDER — ACETAMINOPHEN 325 MG PO TABS
650.0000 mg | ORAL_TABLET | ORAL | Status: DC | PRN
  Administered 2020-09-25: 650 mg via ORAL
  Filled 2020-09-24: qty 2

## 2020-09-24 MED ORDER — MISOPROSTOL 50MCG HALF TABLET
ORAL_TABLET | ORAL | Status: AC
  Filled 2020-09-24: qty 1

## 2020-09-24 MED ORDER — OXYTOCIN-SODIUM CHLORIDE 30-0.9 UT/500ML-% IV SOLN
1.0000 m[IU]/min | INTRAVENOUS | Status: DC
  Administered 2020-09-24: 2 m[IU]/min via INTRAVENOUS
  Filled 2020-09-24: qty 500

## 2020-09-24 MED ORDER — LACTATED RINGERS IV SOLN: 500.0000 mL | Freq: Once | INTRAVENOUS | Status: DC

## 2020-09-24 MED ORDER — OXYTOCIN-SODIUM CHLORIDE 30-0.9 UT/500ML-% IV SOLN: 2.5000 [IU]/h | INTRAVENOUS | Status: DC

## 2020-09-24 MED ORDER — MISOPROSTOL 25 MCG QUARTER TABLET
25.0000 ug | ORAL_TABLET | ORAL | Status: DC
Start: 2020-09-24 — End: 2020-09-24

## 2020-09-24 MED ORDER — FLEET ENEMA 7-19 GM/118ML RE ENEM: 1.0000 | ENEMA | Freq: Every day | RECTAL | Status: DC | PRN

## 2020-09-24 MED ORDER — OXYCODONE-ACETAMINOPHEN 5-325 MG PO TABS: 1.0000 | ORAL_TABLET | ORAL | Status: DC | PRN

## 2020-09-24 MED ORDER — LACTATED RINGERS IV SOLN: 500.0000 mL | INTRAVENOUS | Status: DC | PRN

## 2020-09-24 MED ORDER — MISOPROSTOL 50MCG HALF TABLET
50.0000 ug | ORAL_TABLET | ORAL | Status: DC
  Administered 2020-09-24: 50 ug via BUCCAL

## 2020-09-24 MED ORDER — OXYCODONE-ACETAMINOPHEN 5-325 MG PO TABS
2.0000 | ORAL_TABLET | ORAL | Status: DC | PRN
Start: 2020-09-24 — End: 2020-09-25

## 2020-09-24 NOTE — Telephone Encounter (Signed)
Brittney Khan was scheduled for a prenatal visit with pediatrician for impending newborn. Mom's water broke today and she has been admitted to labor and delivery. Prenatal visit done via phone. Mom agrees with [facility] vaccine policy.

## 2020-09-24 NOTE — H&P (Addendum)
OBSTETRIC ADMISSION HISTORY AND PHYSICAL  Brittney Khan is a 35 y.o. female G2P0010 with IUP at [redacted]w[redacted]d by LMP presenting for PROM. She reports +FMs, No LOF, no VB, no blurry vision, headaches or peripheral edema, and RUQ pain.  She plans on breast and formula feeding. She is undecided for birth control.  She received her prenatal care at  [facility].    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[place] -Elevated blood pressures on admission  Past Medical History: Past Medical History:  Diagnosis Date   Asthma    Hip fracture, right (HCC) 2010   Nausea vomiting and diarrhea 12/23/2012    Past Surgical History: Past Surgical History:  Procedure Laterality Date   NO PAST SURGERIES      Obstetrical History: OB History     Gravida  2   Para      Term      Preterm      AB  1   Living  0      SAB  1   IAB      Ectopic      Multiple      Live Births              Social History Social History   Socioeconomic History   Marital status: Single    Spouse name: Not on file   Number of children: Not on file   Years of education: Not on file   Highest education level: Not on file  Occupational History   Not on file  Tobacco Use   Smoking status: Former    Pack years: 0.00    Types: Cigarettes    Quit date: 04/05/2010    Years since quitting: 10.4   Smokeless tobacco: Never  Vaping Use   Vaping Use: Never used  Substance and Sexual Activity   Alcohol use: Yes    Alcohol/week: 0.0 standard drinks    Comment: socially   Drug use: Not Currently    Frequency: 1.0 times per week    Types: Marijuana    Comment: last used when i found out i was preg.    Sexual activity: Not Currently  Other Topics Concern   Not on file  Social History Narrative   Not on file   Social  Determinants of Health   Financial Resource Strain: Not on file  Food Insecurity: No Food Insecurity   Worried About Running Out of Food in the Last Year: Never true   Ran Out of Food in the Last Year: Never true  Transportation Needs: No Transportation Needs   Lack of Transportation (Medical): No   Lack of Transportation (Non-Medical): No  Physical Activity: Not on file  Stress: Not on file  Social Connections: Not on file    Family History: Family History  Problem Relation Age of Onset   Heart disease Father    Arthritis Maternal Grandmother    Cancer Maternal Grandmother    Diabetes Maternal Grandmother    Hypertension Maternal Grandmother     Allergies: Allergies  Allergen Reactions   Reglan [Metoclopramide] Anxiety    Medications Prior to Admission  Medication Sig Dispense Refill Last Dose   Prenatal Vit-Fe Fumarate-FA (PRENATAL MULTIVITAMIN) TABS tablet Take 1 tablet by mouth daily at 12 noon.   09/23/2020   cyclobenzaprine (FLEXERIL) 10 MG tablet Take  1 tablet (10 mg total) by mouth every 8 (eight) hours as needed for muscle spasms. 30 tablet 0    Ferrous Gluconate 324 (37.5 Fe) MG TABS Take 1 tablet (324 mg total) by mouth every other day. 60 tablet 1    pantoprazole (PROTONIX) 20 MG tablet Take 1 tablet (20 mg total) by mouth 2 (two) times daily. 60 tablet 0      Review of Systems   All systems reviewed and negative except as stated in HPI  Blood pressure (!) 146/95, pulse 88, temperature 98.3 F (36.8 C), temperature source Oral, resp. rate 16, height 5\' 5"  (1.651 m), weight 93 kg, last menstrual period 12/24/2019, SpO2 98 %, unknown if currently breastfeeding. General appearance: alert, cooperative, and appears stated age Lungs: non-labored respirations Heart: regular rate Abdomen: gravid Presentation: cephalic, confirmed by BSUS in MAU Fetal monitoringBaseline: 125 bpm, Variability: Good {> 6 bpm), Accelerations: Reactive, and Decelerations:  Absent Uterine activity: irregular Exam by:: [email or web address], CNM   Prenatal labs: ABO, Rh: --/--/PENDING (06/22 09-25-1969) Antibody: PENDING (06/22 0828) Rubella: 2.47 (12/07 1428) RPR: Non Reactive (04/04 0921)  HBsAg: Negative (12/07 1428)  HIV: Non Reactive (04/04 0921)  GBS: Negative/-- (06/02 1112)  2 hr Glucola wnl Genetic screening  NIPS low risk, AFP negative Anatomy 03-26-1979 wnl  Prenatal Transfer Tool  Maternal Diabetes: No Genetic Screening: Normal Maternal Ultrasounds/Referrals: Normal Fetal Ultrasounds or other Referrals:  None Maternal Substance Abuse:  No Significant Maternal Medications:  None Significant Maternal Lab Results: Group B Strep negative  Results for orders placed or performed during the hospital encounter of 09/24/20 (from the past 24 hour(s))  Protein / creatinine ratio, urine   Collection Time: 09/24/20  8:00 AM  Result Value Ref Range   Creatinine, Urine 208.29 mg/dL   Total Protein, Urine 13 mg/dL   Protein Creatinine Ratio 0.06 0.00 - 0.15 mg/mg[Cre]  Resp Panel by RT-PCR (Flu A&B, Covid) Nasopharyngeal Swab   Collection Time: 09/24/20  8:08 AM   Specimen: Nasopharyngeal Swab; Nasopharyngeal(NP) swabs in vial transport medium  Result Value Ref Range   SARS Coronavirus 2 by RT PCR NEGATIVE NEGATIVE   Influenza A by PCR NEGATIVE NEGATIVE   Influenza B by PCR NEGATIVE NEGATIVE  POCT fern test   Collection Time: 09/24/20  8:08 AM  Result Value Ref Range   POCT Fern Test Negative = intact amniotic membranes   Fern Test   Collection Time: 09/24/20  8:09 AM  Result Value Ref Range   POCT Fern Test Positive = ruptured amniotic membanes   CBC   Collection Time: 09/24/20  8:28 AM  Result Value Ref Range   WBC 13.3 (H) 4.0 - 10.5 K/uL   RBC 3.53 (L) 3.87 - 5.11 MIL/uL   Hemoglobin 10.1 (L) 12.0 - 15.0 g/dL   HCT 09/26/20 (L) 00.9 - 38.1 %   MCV 87.8 80.0 - 100.0 fL   MCH 28.6 26.0 - 34.0 pg   MCHC 32.6 30.0 - 36.0 g/dL   RDW 82.9 93.7 - 16.9 %    Platelets 300 150 - 400 K/uL   nRBC 0.2 0.0 - 0.2 %  Comprehensive metabolic panel   Collection Time: 09/24/20  8:28 AM  Result Value Ref Range   Sodium 136 135 - 145 mmol/L   Potassium 3.5 3.5 - 5.1 mmol/L   Chloride 107 98 - 111 mmol/L   CO2 21 (L) 22 - 32 mmol/L   Glucose, Bld 85 70 - 99 mg/dL   BUN <5 (  L) 6 - 20 mg/dL   Creatinine, Ser 9.37 0.44 - 1.00 mg/dL   Calcium 9.0 8.9 - 90.2 mg/dL   Total Protein 6.0 (L) 6.5 - 8.1 g/dL   Albumin 2.7 (L) 3.5 - 5.0 g/dL   AST 17 15 - 41 U/L   ALT 16 0 - 44 U/L   Alkaline Phosphatase 261 (H) 38 - 126 U/L   Total Bilirubin 0.8 0.3 - 1.2 mg/dL   GFR, Estimated >40 >97 mL/min   Anion gap 8 5 - 15  Type and screen [facility]   Collection Time: 09/24/20  8:28 AM  Result Value Ref Range   ABO/RH(D) PENDING    Antibody Screen PENDING    Sample Expiration      09/27/2020,2359 Performed at [facility], 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 G2P0010 at [redacted]w[redacted]d here for labor management s/p PROM at 0500 on 09/24/20.  #Labor: Will manage expectantly and augment as clinically indicated. #Pain: TBD per pt preference, epidural desired #FWB: Category 1 strip #ID: GBS negative #MOF: breast and formula #MOC: undecided s/p counseling on admission #Circ:  Desired #Elevated Blood Pressure: No prior diagnosis of hypertensive disorder in pregnancy. No concerning signs/symptoms. F/u preeclampsia labs on admission. #Rh negative: s/p rhogam in pregnancy. Plan for rhogam workup in postpartum period.  Littie Deeds, MD PGY-1 09/24/2020 9:54 AM  Attestation of  Supervision of Resident:  I confirm that I have verified the information documented in the  resident's  note and that I have also personally reperformed the history, physical exam and all medical decision making activities.  I have verified that all services and findings are accurately documented in this student's note; and I agree with management and plan as outlined in the documentation. I have also made any necessary editorial changes.  Sheila Oats, MD [facility], [facility] 09/24/2020 12:00 PM

## 2020-09-24 NOTE — Progress Notes (Signed)
Labor Progress Note Brittney Khan is a 35 y.o. G2P0010 at [redacted]w[redacted]d presented for PROM at 0500 on 09/24/20.  S: Pt reports increasing frequency of contractions. No other concerns at this time.  O:  BP (!) 154/94   Pulse 76   Temp 98.3 F (36.8 C) (Oral)   Resp 16   Ht 5\' 5"  (1.651 m)   Wt 93 kg   LMP 12/24/2019   SpO2 98%   BMI 34.11 kg/m  EFM: baseline 130/moderate variability/+accels/no decels Toco: contractions every 2-3 min  CVE: Dilation: 1 Effacement (%): 50 Station: -3 Presentation: Vertex Exam by:: Dr. [email or web address]   [name]: 35 y.o. G2P0010 [redacted]w[redacted]d presented for PROM at 0500 on 09/24/20. #Labor: S/p cytotec x1. FB placed at 1730. Will plan to start pitocin and up-titrate as clinically indicated. #Pain: IV fentanyl; plan for epidural per pt preference #FWB: Category 1 strip #GBS negative #gHTN: new on admission. Preeclampsia labs unremarkable on admission. No concerning symptoms. Will continue to monitor.  09/26/20, MD 5:36 PM

## 2020-09-24 NOTE — Progress Notes (Signed)
Labor Progress Note Brittney Khan is a 35 y.o. G2P0010 at [redacted]w[redacted]d presented for PROM at 0500 on 09/24/20.  S: Doing well without complaints.  O:  BP (!) 160/81   Pulse 77   Temp 98.3 F (36.8 C) (Oral)   Resp 16   Ht 5\' 5"  (1.651 m)   Wt 93 kg   LMP 12/24/2019   SpO2 98%   BMI 34.11 kg/m  EFM: baseline 130/moderate variability/+accels/no decels Toco: contractions every 2-3 min  CVE: Dilation: 5.5 Effacement (%): 50 Station: -2 Presentation: Vertex Exam by:: [email or web address], MD   [name]: 35 y.o. G2P0010 [redacted]w[redacted]d presented for PROM at 0500 on 09/24/20. #Labor: S/p cytotec x1. S/p FB, dislodged with this exam. Pit started @1730 , currently at 50mL/hr, continue to titrate. AROM of forebag with this exam. #Pain: PRN, desires epidural #FWB: Category 1 strip #GBS negative #gHTN: new on admission. Preeclampsia labs unremarkable on admission. No concerning symptoms. Will continue to monitor. 1 severe range BP. #Rh neg: rhogam pp  , MD 8:31 PM

## 2020-09-24 NOTE — Progress Notes (Signed)
Labor Progress Note Brittney Khan is a 35 y.o. G2P0010 at [redacted]w[redacted]d presented for PROM at 0500 on 09/24/20.  S: Pt reports minimal discomfort with rare contractions. No other concerns at this time.  O:  BP (!) 156/99   Pulse 71   Temp 98.3 F (36.8 C) (Oral)   Resp 18   Ht 5\' 5"  (1.651 m)   Wt 93 kg   LMP 12/24/2019   SpO2 98%   BMI 34.11 kg/m  EFM: baseline 125/moderate variability/+accels/no decels Toco: irregular contractions, mild  CVE: Presentation: Vertex Exam by:: [email or web address], CNM   [name]: 35 y.o. G2P0010 [redacted]w[redacted]d presented for PROM at 0500 on 09/24/20. #Labor: Given minimal cervical dilation s/p PROM, will administer cytotec and consider FB placement in 4 hours. #Pain: TBD   #FWB: Category 1 strip #GBS negative #gHTN: new on admission. Preeclampsia labs unremarkable on admission. No concerning symptoms. Will continue to monitor.  09/26/20, MD 1:23 PM

## 2020-09-24 NOTE — MAU Note (Signed)
Had a gush of clear fluid at 0500, not really any since. No bleeding. No contractions. Has not been checked. Denies problems with preg.

## 2020-09-24 NOTE — Telephone Encounter (Signed)
Catrinia had a prenatal consult scheduled for 6/23 her water broke 6/22.

## 2020-09-25 ENCOUNTER — Institutional Professional Consult (permissible substitution): Payer: Self-pay | Admitting: Pediatrics

## 2020-09-25 ENCOUNTER — Encounter (HOSPITAL_COMMUNITY): Payer: Self-pay | Admitting: Obstetrics and Gynecology

## 2020-09-25 ENCOUNTER — Encounter: Payer: No Typology Code available for payment source | Admitting: Student

## 2020-09-25 DIAGNOSIS — Z3A39 39 weeks gestation of pregnancy: Secondary | ICD-10-CM

## 2020-09-25 DIAGNOSIS — O139 Gestational [pregnancy-induced] hypertension without significant proteinuria, unspecified trimester: Secondary | ICD-10-CM

## 2020-09-25 LAB — CBC
HCT: 27 % — ABNORMAL LOW (ref 36.0–46.0)
Hemoglobin: 8.8 g/dL — ABNORMAL LOW (ref 12.0–15.0)
MCH: 28.5 pg (ref 26.0–34.0)
MCHC: 32.6 g/dL (ref 30.0–36.0)
MCV: 87.4 fL (ref 80.0–100.0)
Platelets: 248 10*3/uL (ref 150–400)
RBC: 3.09 MIL/uL — ABNORMAL LOW (ref 3.87–5.11)
RDW: 12.4 % (ref 11.5–15.5)
WBC: 26.2 10*3/uL — ABNORMAL HIGH (ref 4.0–10.5)
nRBC: 0 % (ref 0.0–0.2)

## 2020-09-25 MED ORDER — PRENATAL MULTIVITAMIN CH
1.0000 | ORAL_TABLET | Freq: Every day | ORAL | Status: DC
  Administered 2020-09-26 – 2020-09-27 (×2): 1 via ORAL
  Filled 2020-09-25 (×2): qty 1

## 2020-09-25 MED ORDER — WITCH HAZEL-GLYCERIN EX PADS: 1.0000 "application " | MEDICATED_PAD | CUTANEOUS | Status: DC | PRN

## 2020-09-25 MED ORDER — ONDANSETRON HCL 4 MG/2ML IJ SOLN: 4.0000 mg | INTRAMUSCULAR | Status: DC | PRN

## 2020-09-25 MED ORDER — ACETAMINOPHEN 325 MG PO TABS
650.0000 mg | ORAL_TABLET | ORAL | Status: DC
  Administered 2020-09-25 – 2020-09-27 (×7): 650 mg via ORAL
  Filled 2020-09-25 (×8): qty 2

## 2020-09-25 MED ORDER — COCONUT OIL OIL
1.0000 "application " | TOPICAL_OIL | Status: DC | PRN
  Administered 2020-09-27: 1 via TOPICAL

## 2020-09-25 MED ORDER — OXYCODONE HCL 5 MG PO TABS
10.0000 mg | ORAL_TABLET | ORAL | Status: DC | PRN
  Administered 2020-09-26: 10 mg via ORAL
  Filled 2020-09-25: qty 2

## 2020-09-25 MED ORDER — FENTANYL-BUPIVACAINE-NACL 0.5-0.125-0.9 MG/250ML-% EP SOLN: 12.0000 mL/h | EPIDURAL | Status: DC | PRN

## 2020-09-25 MED ORDER — DIPHENHYDRAMINE HCL 25 MG PO CAPS: 25.0000 mg | ORAL_CAPSULE | Freq: Four times a day (QID) | ORAL | Status: DC | PRN

## 2020-09-25 MED ORDER — AMLODIPINE BESYLATE 5 MG PO TABS
5.0000 mg | ORAL_TABLET | Freq: Every day | ORAL | Status: DC
  Administered 2020-09-26: 5 mg via ORAL
  Filled 2020-09-25: qty 1

## 2020-09-25 MED ORDER — DIPHENHYDRAMINE HCL 50 MG/ML IJ SOLN: 12.5000 mg | INTRAMUSCULAR | Status: DC | PRN

## 2020-09-25 MED ORDER — SIMETHICONE 80 MG PO CHEW: 80.0000 mg | CHEWABLE_TABLET | ORAL | Status: DC | PRN

## 2020-09-25 MED ORDER — IBUPROFEN 600 MG PO TABS
600.0000 mg | ORAL_TABLET | Freq: Four times a day (QID) | ORAL | Status: DC
  Administered 2020-09-25 – 2020-09-27 (×9): 600 mg via ORAL
  Filled 2020-09-25 (×9): qty 1

## 2020-09-25 MED ORDER — LIDOCAINE HCL (PF) 1 % IJ SOLN
INTRAMUSCULAR | Status: DC | PRN
  Administered 2020-09-24: 6 mL via EPIDURAL

## 2020-09-25 MED ORDER — TRANEXAMIC ACID-NACL 1000-0.7 MG/100ML-% IV SOLN
INTRAVENOUS | Status: AC
  Filled 2020-09-25: qty 100

## 2020-09-25 MED ORDER — OXYCODONE HCL 5 MG PO TABS
5.0000 mg | ORAL_TABLET | ORAL | Status: DC | PRN
  Administered 2020-09-25: 5 mg via ORAL
  Filled 2020-09-25: qty 1

## 2020-09-25 MED ORDER — BENZOCAINE-MENTHOL 20-0.5 % EX AERO: 1.0000 "application " | INHALATION_SPRAY | CUTANEOUS | Status: DC | PRN

## 2020-09-25 MED ORDER — SENNOSIDES-DOCUSATE SODIUM 8.6-50 MG PO TABS
2.0000 | ORAL_TABLET | Freq: Every day | ORAL | Status: DC
  Administered 2020-09-26: 2 via ORAL
  Filled 2020-09-25 (×2): qty 2

## 2020-09-25 MED ORDER — TRANEXAMIC ACID-NACL 1000-0.7 MG/100ML-% IV SOLN
1000.0000 mg | INTRAVENOUS | Status: AC
  Administered 2020-09-25: 1000 mg via INTRAVENOUS

## 2020-09-25 MED ORDER — ONDANSETRON HCL 4 MG PO TABS: 4.0000 mg | ORAL_TABLET | ORAL | Status: DC | PRN

## 2020-09-25 MED ORDER — DIBUCAINE (PERIANAL) 1 % EX OINT: 1.0000 "application " | TOPICAL_OINTMENT | CUTANEOUS | Status: DC | PRN

## 2020-09-25 MED ORDER — TETANUS-DIPHTH-ACELL PERTUSSIS 5-2.5-18.5 LF-MCG/0.5 IM SUSY: 0.5000 mL | PREFILLED_SYRINGE | Freq: Once | INTRAMUSCULAR | Status: DC

## 2020-09-25 NOTE — Anesthesia Procedure Notes (Signed)
Epidural Patient location during procedure: OB Start time: 09/24/2020 11:55 PM End time: 09/24/2020 11:59 PM  Staffing Anesthesiologist: Bethena Midget, MD  Preanesthetic Checklist Completed: patient identified, IV checked, site marked, risks and benefits discussed, surgical consent, monitors and equipment checked, pre-op evaluation and timeout performed  Epidural Patient position: sitting Prep: DuraPrep and site prepped and draped Patient monitoring: continuous pulse ox and blood pressure Approach: midline Location: L4-L5 Injection technique: LOR air  Needle:  Needle type: Tuohy  Needle gauge: 17 G Needle length: 9 cm and 9 Needle insertion depth: 7 cm Catheter type: closed end flexible Catheter size: 19 Gauge Catheter at skin depth: 13 cm Test dose: negative  Assessment Events: blood not aspirated, injection not painful, no injection resistance, no paresthesia and negative IV test  Additional Notes SRNA

## 2020-09-25 NOTE — Progress Notes (Signed)
Labor Progress Note Opie Fanton is a 35 y.o. G2P0010 at [redacted]w[redacted]d presented for PROM at 0500 on 09/24/20.  S: Strip reviewed  O:  BP 136/80   Pulse 92   Temp 98.7 F (37.1 C) (Oral)   Resp 16   Ht 5\' 5"  (1.651 m)   Wt 93 kg   LMP 12/24/2019   SpO2 99%   BMI 34.11 kg/m  EFM: baseline 145/moderate variability/+accels/intermittent early decels with contractions Toco: contractions every 1 min  CVE: Dilation: 5.5 Effacement (%): 50 Station: -2 Presentation: Vertex Exam by:: [email or web address], MD   [name]: 35 y.o. G2P0010 [redacted]w[redacted]d presented for PROM at 0500 on 09/24/20. #Labor: S/p cytotec x1. S/p FB.09/26/20 Pit started @1730  6/22, currently at 67mL/hr, continue to titrate. #Pain: epidural #FWB: Category 1 strip #GBS negative #gHTN: new on admission. Preeclampsia labs unremarkable on admission. No concerning symptoms. #Rh neg: rhogam pp  7/22, MD 12:21 AM

## 2020-09-25 NOTE — Anesthesia Postprocedure Evaluation (Signed)
Anesthesia Post Note  Patient: [name]  Procedure(s) Performed: AN AD HOC LABOR EPIDURAL     Patient location during evaluation: Mother Baby Anesthesia Type: Epidural Level of consciousness: awake and alert Pain management: pain level controlled Vital Signs Assessment: post-procedure vital signs reviewed and stable Respiratory status: spontaneous breathing, nonlabored ventilation and respiratory function stable Cardiovascular status: stable Postop Assessment: no headache, no backache and epidural receding Anesthetic complications: no   No notable events documented.  Last Vitals:  Vitals:   09/25/20 1207 09/25/20 1314  BP: 140/90 (!) 144/87  Pulse: (!) 112 94  Resp: 18   Temp: 37.2 C   SpO2: 100%     Last Pain:  Vitals:   09/25/20 1501  TempSrc:   PainSc: 0-No pain   Pain Goal:                   Brittney Khan

## 2020-09-25 NOTE — Progress Notes (Signed)
Labor Progress Note Brittney Khan is a 35 y.o. G2P0010 at [redacted]w[redacted]d presented for PROM at 0500 on 09/24/20.  S: Doing well without complaints.  O:  BP 115/64   Pulse (!) 106   Temp 99.3 F (37.4 C) (Axillary)   Resp 16   Ht 5\' 5"  (1.651 m)   Wt 93 kg   LMP 12/24/2019   SpO2 97%   BMI 34.11 kg/m  EFM: baseline 130/moderate variability/+accels/no decels Toco: contractions every 1-33min  CVE: Dilation: 9 Effacement (%): 90 Station: 0, Plus 1 Presentation: Vertex Exam by:: L. [email or web address], RN   [name]: 35 y.o. G2P0010 [redacted]w[redacted]d presented for PROM at 0500 on 09/24/20. #Labor: S/p cytotec x1. S/p FB.09/26/20 Pit started @1730  6/22, currently at 77mL/hr, continue to titrate. #Pain: epidural #FWB: Category 1 strip #GBS negative #gHTN: new on admission. Preeclampsia labs unremarkable on admission. No concerning symptoms. #Rh neg: rhogam pp  7/22, MD 5:18 AM

## 2020-09-25 NOTE — Anesthesia Preprocedure Evaluation (Signed)
Anesthesia Evaluation  Patient identified by MRN, date of birth, ID band Patient awake    Reviewed: Allergy & Precautions, H&P , NPO status , Patient's Chart, lab work & pertinent test results, reviewed documented beta blocker date and time   Airway Mallampati: I  TM Distance: >3 FB Neck ROM: full    Dental no notable dental hx. (+) Teeth Intact, Dental Advisory Given   Pulmonary neg pulmonary ROS, former smoker,    Pulmonary exam normal breath sounds clear to auscultation       Cardiovascular + CAD  negative cardio ROS Normal cardiovascular exam Rhythm:regular Rate:Normal     Neuro/Psych negative neurological ROS  negative psych ROS   GI/Hepatic negative GI ROS, Neg liver ROS,   Endo/Other  negative endocrine ROS  Renal/GU negative Renal ROS  negative genitourinary   Musculoskeletal   Abdominal   Peds  Hematology negative hematology ROS (+)   Anesthesia Other Findings   Reproductive/Obstetrics (+) Pregnancy                             Anesthesia Physical Anesthesia Plan  ASA: 2  Anesthesia Plan: Epidural   Post-op Pain Management:    Induction:   PONV Risk Score and Plan:   Airway Management Planned: Natural Airway  Additional Equipment: None  Intra-op Plan:   Post-operative Plan:   Informed Consent: I have reviewed the patients History and Physical, chart, labs and discussed the procedure including the risks, benefits and alternatives for the proposed anesthesia with the patient or authorized representative who has indicated his/her understanding and acceptance.       Plan Discussed with: Anesthesiologist  Anesthesia Plan Comments:         Anesthesia Quick Evaluation

## 2020-09-25 NOTE — Discharge Summary (Addendum)
Postpartum Discharge Summary     Patient Name: Brittney Khan DOB: 1985/12/05 MRN: [id number]  Date of admission: 09/24/2020 Delivery date:09/25/2020  Delivering provider: Zola Button  Date of discharge: 09/27/2020  Admitting diagnosis: Normal labor [O80, Z37.9] Intrauterine pregnancy: [redacted]w[redacted]d    Secondary diagnosis:  Active Problems:   Rh negative state in antepartum period   Normal labor   Gestational hypertension  Additional problems: headache    Discharge diagnosis: Term Pregnancy Delivered and Gestational Hypertension                                              Post partum procedures:rhogam Augmentation: Pitocin, Cytotec, and IP Foley Complications: None  Hospital course: Onset of Labor With Vaginal Delivery      35y.o. yo G2P0010 at 31w3das admitted in Latent Labor on 09/24/2020. Patient had an uncomplicated labor course as follows:  Membrane Rupture Time/Date: 8:29 PM ,09/24/2020   Delivery Method:Vaginal, Spontaneous  Episiotomy: None  Lacerations:  2nd degree  During her postpartum course, she had a persistent headache refractory to ibuprofen and Tylenol but did improve with Fioricet. Headache is bitemporal and posterior without positional component. BP persistently elevated so was started on amlodipine 5 mg daily. PEC labs were repeated which were unremarkable with the exception of Hgb 6.8. She was transfused 1u pRBC with improvement in Hgb to 7.7. She is ambulating, tolerating a regular diet, passing flatus, and urinating well. She was discharged with cyclobenzaprine as needed for headache. Patient is discharged home in stable condition on 09/27/20.  Newborn Data: Birth date:09/25/2020  Birth time:9:00 AM  Gender:Female  Living status:Living  Apgars:9 ,9  Weight:3459 g   Magnesium Sulfate received: No BMZ received: No Rhophylac:Yes  MMR:No T-DaP:Given prenatally Flu: No Transfusion:Yes, received 1u pRBC for Hgb 6.8  Physical exam  Vitals:   09/27/20  0340 09/27/20 0853 09/27/20 1029 09/27/20 1030  BP: (!) 147/89 121/73 134/81 134/81  Pulse: 71 65    Resp: 18     Temp: 98.2 F (36.8 C)     TempSrc: Oral     SpO2: 99%     Weight:      Height:       General: alert, cooperative, and no distress Lochia: appropriate Uterine Fundus: firm Incision: N/A DVT Evaluation: No significant calf/ankle edema. Labs: Lab Results  Component Value Date   WBC 16.6 (H) 09/27/2020   HGB 7.7 (L) 09/27/2020   HCT 24.3 (L) 09/27/2020   MCV 85.6 09/27/2020   PLT 268 09/27/2020   CMP Latest Ref Rng & Units 09/26/2020  Glucose 70 - 99 mg/dL 87  BUN 6 - 20 mg/dL 5(L)  Creatinine 0.44 - 1.00 mg/dL 0.77  Sodium 135 - 145 mmol/L 138  Potassium 3.5 - 5.1 mmol/L 3.4(L)  Chloride 98 - 111 mmol/L 109  CO2 22 - 32 mmol/L 24  Calcium 8.9 - 10.3 mg/dL 9.0  Total Protein 6.5 - 8.1 g/dL 5.1(L)  Total Bilirubin 0.3 - 1.2 mg/dL 0.5  Alkaline Phos 38 - 126 U/L 173(H)  AST 15 - 41 U/L 20  ALT 0 - 44 U/L 15   Edinburgh Score: No flowsheet data found.   After visit meds:  Allergies as of 09/27/2020       Reactions   Reglan [metoclopramide] Anxiety        Medication List  TAKE these medications    acetaminophen 325 MG tablet Commonly known as: Tylenol Take 2 tablets (650 mg total) by mouth every 4 (four) hours.   amLODipine 5 MG tablet Commonly known as: NORVASC Take 1 tablet (5 mg total) by mouth daily.   coconut oil Oil Apply 1 application topically as needed.   cyclobenzaprine 10 MG tablet Commonly known as: FLEXERIL Take 1 tablet (10 mg total) by mouth every 8 (eight) hours as needed for muscle spasms.   Ferrous Gluconate 324 (37.5 Fe) MG Tabs Take 1 tablet (324 mg total) by mouth every other day.   ibuprofen 600 MG tablet Commonly known as: ADVIL Take 1 tablet (600 mg total) by mouth every 6 (six) hours.   pantoprazole 20 MG tablet Commonly known as: Protonix Take 1 tablet (20 mg total) by mouth 2 (two) times daily.    prenatal multivitamin Tabs tablet Take 1 tablet by mouth daily at 12 noon.        Discharge home in stable condition Infant Feeding: Bottle and Breast Infant Disposition:home with mother Discharge instruction: per After Visit Summary and Postpartum booklet. Activity: Advance as tolerated. Pelvic rest for 6 weeks.  Diet: routine diet Future Appointments: Future Appointments  Date Time Provider [place]  10/02/2020  2:30 PM [facility]  10/30/2020  3:35 PM Renee Harder, CNM [facility]   Follow up Visit:   Please schedule this patient for a In person postpartum visit in 6 weeks with the following provider: APP. Additional Postpartum F/U:BP check 1 week  Low risk pregnancy complicated by: HTN Delivery mode:  Vaginal, Spontaneous  Anticipated Birth Control:  Unsure   09/27/2020 Zola Button, MD    I have seen and examined this patient and agree with above documentation in the resident's note.   Renee Harder, MSN, CNM 09/27/20 8:14 PM

## 2020-09-25 NOTE — Lactation Note (Signed)
This note was copied from a baby's chart. Lactation Consultation Note  Patient Name: Brittney Khan WCHEN'I Date: 09/25/2020 Reason for consult: Initial assessment;Primapara;1st time breastfeeding;Term Age:33 hours  Initial visit to 12 hours old infant of a P1 mother. Infant is getting a bath upon LC arrival. Mother states infant has fed well once. LC demonstrated hand expression, collected EBM and spoonfed ~62mL.  Plan: 1-Skin to skin 2-Aim for a deep, comfortable latch 3-Breastfeeding on demand or 8-12 times in 24h period. 4-Keep infant awake during breastfeeding session: massaging breast, infant's hand/shoulder/feet 5-Monitor voids and stools as signs good intake.  6-Encouraged maternal rest, hydration and food intake.  7-Contact LC as needed for feeds/support/concerns/questions   All questions answered at this time. Provided Lactation services brochure and promoted [employer] booklet information. tea   Maternal Data Has patient been taught Hand Expression?: Yes Does the patient have breastfeeding experience prior to this delivery?: No  Feeding Mother's Current Feeding Choice: Breast Milk  Interventions Interventions: Breast feeding basics reviewed;Skin to skin;Breast massage;Hand express;Hand pump;Expressed milk;Education;Shells (shells for edema: to be used for 24h unless sleeping or feeding. Hand pump for nipple eversion.)  Discharge Pump: Manual;Personal WIC Program: Yes  Consult Status Consult Status: Follow-up Date: 09/26/20 Follow-up type: In-patient    Ikran Patman A Higuera Ancidey 09/25/2020, 9:34 PM

## 2020-09-26 ENCOUNTER — Other Ambulatory Visit (HOSPITAL_COMMUNITY): Payer: Self-pay

## 2020-09-26 LAB — COMPREHENSIVE METABOLIC PANEL
ALT: 15 U/L (ref 0–44)
AST: 20 U/L (ref 15–41)
Albumin: 2.4 g/dL — ABNORMAL LOW (ref 3.5–5.0)
Alkaline Phosphatase: 173 U/L — ABNORMAL HIGH (ref 38–126)
Anion gap: 5 (ref 5–15)
BUN: 5 mg/dL — ABNORMAL LOW (ref 6–20)
CO2: 24 mmol/L (ref 22–32)
Calcium: 9 mg/dL (ref 8.9–10.3)
Chloride: 109 mmol/L (ref 98–111)
Creatinine, Ser: 0.77 mg/dL (ref 0.44–1.00)
GFR, Estimated: >60 mL/min (ref >60)
Glucose, Bld: 87 mg/dL (ref 70–99)
Potassium: 3.4 mmol/L — ABNORMAL LOW (ref 3.5–5.1)
Sodium: 138 mmol/L (ref 135–145)
Total Bilirubin: 0.5 mg/dL (ref 0.3–1.2)
Total Protein: 5.1 g/dL — ABNORMAL LOW (ref 6.5–8.1)

## 2020-09-26 LAB — CBC
HCT: 21.4 % — ABNORMAL LOW (ref 36.0–46.0)
Hemoglobin: 6.8 g/dL — CL (ref 12.0–15.0)
MCH: 27.9 pg (ref 26.0–34.0)
MCHC: 31.8 g/dL (ref 30.0–36.0)
MCV: 87.7 fL (ref 80.0–100.0)
Platelets: 269 10*3/uL (ref 150–400)
RBC: 2.44 MIL/uL — ABNORMAL LOW (ref 3.87–5.11)
RDW: 12.8 % (ref 11.5–15.5)
WBC: 18.7 10*3/uL — ABNORMAL HIGH (ref 4.0–10.5)
nRBC: 0 % (ref 0.0–0.2)

## 2020-09-26 LAB — PREPARE RBC (CROSSMATCH)

## 2020-09-26 MED ORDER — RHO D IMMUNE GLOBULIN 1500 UNIT/2ML IJ SOSY
300.0000 ug | PREFILLED_SYRINGE | Freq: Once | INTRAMUSCULAR | Status: AC
  Administered 2020-09-26: 300 ug via INTRAVENOUS
  Filled 2020-09-26: qty 2

## 2020-09-26 MED ORDER — RHO D IMMUNE GLOBULIN 1500 UNIT/2ML IJ SOSY
300.0000 ug | PREFILLED_SYRINGE | Freq: Once | INTRAMUSCULAR | Status: DC
  Filled 2020-09-26: qty 2

## 2020-09-26 MED ORDER — COCONUT OIL OIL: 1.0000 "application " | TOPICAL_OIL | 0 refills | Status: DC | PRN

## 2020-09-26 MED ORDER — AMLODIPINE BESYLATE 5 MG PO TABS
5.0000 mg | ORAL_TABLET | Freq: Every day | ORAL | 2 refills | Status: DC
  Filled 2020-09-26: qty 30, 30d supply, fill #0

## 2020-09-26 MED ORDER — BUTALBITAL-APAP-CAFFEINE 50-325-40 MG PO TABS
1.0000 | ORAL_TABLET | Freq: Once | ORAL | Status: AC
  Administered 2020-09-26: 1 via ORAL
  Filled 2020-09-26: qty 1

## 2020-09-26 MED ORDER — ACETAMINOPHEN 325 MG PO TABS
650.0000 mg | ORAL_TABLET | ORAL | Status: DC
Start: 2020-09-26 — End: 2022-02-14

## 2020-09-26 MED ORDER — IBUPROFEN 600 MG PO TABS: 600.0000 mg | ORAL_TABLET | Freq: Four times a day (QID) | ORAL | 0 refills | Status: DC

## 2020-09-26 MED ORDER — SODIUM CHLORIDE 0.9% IV SOLUTION: Freq: Once | INTRAVENOUS | Status: AC

## 2020-09-26 NOTE — Progress Notes (Addendum)
POSTPARTUM PROGRESS NOTE  Post Partum Day 1  Subjective:  Brittney Khan is a 35 y.o. G2P1011 s/p SVD at [redacted]w[redacted]d.  No acute events overnight.  Pt denies problems with ambulating, voiding or po intake.  She denies nausea or vomiting.  Pain is moderately controlled. Reports headache which is temporarily relieved with oxycodone. She has had flatus. She has not had bowel movement.  Lochia Minimal.   Objective: Blood pressure 134/84, pulse 99, temperature 98 F (36.7 C), resp. rate 16, height 5\' 5"  (1.651 m), weight 93 kg, last menstrual period 12/24/2019, SpO2 100 %, unknown if currently breastfeeding.  Physical Exam:  General: alert, cooperative and no distress Chest: no respiratory distress Heart:regular rate Abdomen: soft, nontender,  Uterine Fundus: firm, appropriately tender DVT Evaluation: No calf swelling or tenderness Extremities: no edema Skin: warm, dry  Recent Labs    09/24/20 2036 09/25/20 1010  HGB 10.2* 8.8*  HCT 30.8* 27.0*    Assessment/Plan: Brittney Khan is a 35 y.o. G2P1011 s/p SVD at [redacted]w[redacted]d   PPD#1 - Doing well, meeting pp milestones -desires circ for baby, consented gHTN: elevated BP as high as 140s-150s/90s, starting amlodipine 5 mg daily Rh negative: baby O+, will give Rhogam Contraception: considering Nexplanon outpatient Feeding: breast and bottle Dispo: Plan for discharge later today or tomorrow pending patient preference, undecided at this time.   LOS: 2 days   [redacted]w[redacted]d, MD 09/26/2020, 8:00 AM    GME ATTESTATION:  I saw and evaluated the patient. I agree with the findings and the plan of care as documented in the resident's note.  09/28/2020, MD OB Fellow, [facility], Center for [facility] 09/26/2020 8:07 AM

## 2020-09-26 NOTE — Lactation Note (Signed)
This note was copied from a baby's chart. Lactation Consultation Note  Patient Name: Brittney Khan QBHAL'P Date: 09/26/2020 Reason for consult: Follow-up assessment;1st time breastfeeding;Term Age:33 hours per mom, infant only had one stool today. LC entered the room, mom was attempting to breastfeed infant, LC notice infant's latch was shallow and he was only on the tip of mom's nipple as she was bent over feeding him. Mom open to [facility] suggestions, LC gave mom pillow support and ask mom to sit in up right position and bring infant to breast, mom re-latch infant on her left breast using the cross cradle hold. Infant latched with depth , swallows were observed and infant breastfeed for 15 minutes, per mom, she only feels a tug and mom's nipples were rounded and not pinch when infant came off the breast. LC reviewed hand expression with mom, mom expressed 12 mls of colostrum that was spoon fed to infant, mom was concern that infant not had a lot of voids or stools today. Per mom, she only been latching infant at one breast at at time. Mom's plan: 1- Mom will latch infant according to feeding cues, 8 to 12+ or more times within 24 hours, STS. 2- Mom knows to call RN or LC if she needs further assistance with latching infant at the breast. 3- After latching infant at the breast, mom can hand express and give infant back extra volume of EBM.  4- Mom will latch infant on both breast during a feeding. Maternal Data Has patient been taught Hand Expression?: Yes Does the patient have breastfeeding experience prior to this delivery?: No  Feeding Mother's Current Feeding Choice: Breast Milk  LATCH Score Latch: Grasps breast easily, tongue down, lips flanged, rhythmical sucking.  Audible Swallowing: Spontaneous and intermittent  Type of Nipple: Everted at rest and after stimulation  Comfort (Breast/Nipple): Soft / non-tender  Hold (Positioning): Assistance needed to correctly position infant  at breast and maintain latch.  LATCH Score: 9   Lactation Tools Discussed/Used    Interventions Interventions: Assisted with latch;Skin to skin;Hand express;Breast compression;Adjust position;Support pillows;Position options;Expressed milk;Education  Discharge    Consult Status Consult Status: Follow-up Date: 09/27/20 Follow-up type: In-patient    Danelle Earthly 09/26/2020, 6:00 PM

## 2020-09-27 LAB — CBC
HCT: 24.3 % — ABNORMAL LOW (ref 36.0–46.0)
Hemoglobin: 7.7 g/dL — ABNORMAL LOW (ref 12.0–15.0)
MCH: 27.1 pg (ref 26.0–34.0)
MCHC: 31.7 g/dL (ref 30.0–36.0)
MCV: 85.6 fL (ref 80.0–100.0)
Platelets: 268 10*3/uL (ref 150–400)
RBC: 2.84 MIL/uL — ABNORMAL LOW (ref 3.87–5.11)
RDW: 14.9 % (ref 11.5–15.5)
WBC: 16.6 10*3/uL — ABNORMAL HIGH (ref 4.0–10.5)
nRBC: 0 % (ref 0.0–0.2)

## 2020-09-27 LAB — TYPE AND SCREEN
ABO/RH(D): B NEG
Antibody Screen: POSITIVE
Unit division: 0

## 2020-09-27 LAB — RH IG WORKUP (INCLUDES ABO/RH)
Fetal Screen: NEGATIVE
Gestational Age(Wks): 39.3
Unit division: 0

## 2020-09-27 LAB — BPAM RBC
Blood Product Expiration Date: 202206302359
ISSUE DATE / TIME: 202206242104
Unit Type and Rh: 1700

## 2020-09-27 MED ORDER — DIPHENHYDRAMINE HCL 50 MG/ML IJ SOLN
25.0000 mg | Freq: Once | INTRAMUSCULAR | Status: AC
  Administered 2020-09-27: 25 mg via INTRAVENOUS
  Filled 2020-09-27: qty 1

## 2020-09-27 MED ORDER — CYCLOBENZAPRINE HCL 10 MG PO TABS
10.0000 mg | ORAL_TABLET | Freq: Three times a day (TID) | ORAL | 0 refills | Status: DC | PRN
Start: 2020-09-27 — End: 2022-02-14

## 2020-09-27 MED ORDER — AMLODIPINE BESYLATE 5 MG PO TABS
5.0000 mg | ORAL_TABLET | Freq: Every day | ORAL | Status: DC
  Administered 2020-09-27: 5 mg via ORAL
  Filled 2020-09-27: qty 1

## 2020-09-27 MED ORDER — DEXAMETHASONE SODIUM PHOSPHATE 10 MG/ML IJ SOLN
10.0000 mg | Freq: Once | INTRAMUSCULAR | Status: AC
  Administered 2020-09-27: 10 mg via INTRAVENOUS
  Filled 2020-09-27: qty 1

## 2020-09-27 MED ORDER — AMLODIPINE BESYLATE 5 MG PO TABS: 10.0000 mg | ORAL_TABLET | Freq: Every day | ORAL | Status: DC

## 2020-09-27 MED ORDER — FERROUS SULFATE 325 (65 FE) MG PO TABS
325.0000 mg | ORAL_TABLET | ORAL | Status: DC
  Administered 2020-09-27: 325 mg via ORAL
  Filled 2020-09-27: qty 1

## 2020-09-27 MED ORDER — AMLODIPINE BESYLATE 5 MG PO TABS: 5.0000 mg | ORAL_TABLET | Freq: Every day | ORAL | 2 refills | Status: DC

## 2020-09-27 MED ORDER — BUTALBITAL-APAP-CAFFEINE 50-325-40 MG PO TABS
1.0000 | ORAL_TABLET | Freq: Once | ORAL | Status: AC
  Administered 2020-09-27: 1 via ORAL
  Filled 2020-09-27: qty 1

## 2020-09-27 MED ORDER — ACETAMINOPHEN 500 MG PO TABS
1000.0000 mg | ORAL_TABLET | Freq: Once | ORAL | Status: AC
  Administered 2020-09-27: 1000 mg via ORAL
  Filled 2020-09-27: qty 2

## 2020-09-27 MED ORDER — CYCLOBENZAPRINE HCL 5 MG PO TABS
5.0000 mg | ORAL_TABLET | Freq: Three times a day (TID) | ORAL | Status: DC | PRN
  Administered 2020-09-27: 5 mg via ORAL
  Filled 2020-09-27: qty 1

## 2020-09-27 MED ORDER — METOCLOPRAMIDE HCL 5 MG/ML IJ SOLN
10.0000 mg | Freq: Once | INTRAMUSCULAR | Status: AC
  Administered 2020-09-27: 10 mg via INTRAVENOUS
  Filled 2020-09-27: qty 2

## 2020-09-27 NOTE — Progress Notes (Signed)
In to round on patient this morning. Patient is reporting a HA that she rates 7/10. HA is primarily in the back and radiates down her neck and shoulders and into her temples. She denies vision changes, RUQ pain, CP, SOB, as well as signs of illness such as congestion, runny nose, sore throat, cough, or fever. She does report that her left ear sounds muffled, which started yesterday. Patient reports that her HA is worsened if she moves her head back or looks up to the ceiling. HA is not worsened with standing, sitting, or lying back. Patient was given oxycodone around 0530 this morning which relieved her HA somewhat, however it came back. BP remains normotensive and pre-e labs are wnl. She was given pRBC's yesterday for hgb on 6.8. Her Hgb is stable this morning at 7.7. She remains asymptomatic. Assessed patient's ears with just a slight amount of fluid noted behind left tympanic membrane, otherwise wnl. Dr. Charlotta Newton as well as anesthesia were both on unit and we discussed giving her Flexeril to if there was any improvement. She was given Flexeril around 0900am and on reassessment, her HA has not improved. Re-discussed patient with Dr. Charlotta Newton who recommends HA cocktail. If headache improves after headache, patient may be discharged home. Dr. Charlotta Newton will reassess patient later this afternoon.     Brand Males, MSN, CNM 09/27/20 1:07 PM

## 2020-09-27 NOTE — Lactation Note (Addendum)
This note was copied from a baby's chart. Lactation Consultation Note  Patient Name: Brittney Khan [id number]'E Date: 09/27/2020 Reason for consult: Follow-up assessment;Mother's request;Term;Infant weight loss;Hyperbilirubinemia Age:33 hours  Infant started on formula as per MD request. LC not able to see a latch infant fed 20 ml of formula prior to arrival. Infant  also going for a circ.  Moms' breast felt dense some areas of dependent edema in both breasts. Moist heat and coconut oil  provided with breast massage to relieve areas of hardness. Mom also using manual pump with 27 flange increased from 24, Mom stated better fit.    Plan 1.to feed based on cues 8-12x in 24h period no more than 4 hrs without an attempt. Mom to offer both breasts with breast compression, STS and look for swallows. ( Breast shells and pre pumping before latching)       2. Mom to supplement with EBM first followed by formula. LC reviewed paced bottle feeding with extra slow flow nipple. Breast feeding supplementation guide based on hrs of age since delivery reviewed.             3. Mom to pump with personal Medela pump q 3 hrs for 15 minutes.               4. LC brochure of inpatient and outpatient services reviewed. Infant bili little elevated, Mom aware to offer much of EBM to help remove bilirubin.  All questions answered at the end of the  visit.   LC returned to assist with latching on the right breast. Nipples are large and wide and compressible. Infant latching shallow no evidence of compression or bruising. Mom dependent hardness some areas resolved with use of manual pump and infant latching.   With manual pump, Mom collected 12 ml of EBM.   Infant has labial attachment and cordlike lingual attachment. Infant able to extend tongue pass the gumline.   LC helped Mother to latch on right breast with use of 24 NS, signs of milk transfer noted. Mom aware use of NS is a barrier to  let down. Mom to use  personal electric medela pump q 3 hrs for .  Mom to pace bottle feed EBM first followed by formula with purple extra slow flow nipple and pace bottle feeding.   Mom follow up with [facility] for breastfeeding support.                Maternal Data    Feeding Mother's Current Feeding Choice: Breast Milk and Formula Nipple Type: Extra Slow Flow  LATCH Score                    Lactation Tools Discussed/Used Tools: Pump;Flanges;Coconut oil Flange Size: 27 Breast pump type: Manual Reason for Pumping: pre pumping 5-10 minutes before latching to extend her nipples. Pumping frequency: pre pumping 5-10 minutes before latching to extend her nipples.  Interventions Interventions: Breast feeding basics reviewed;Breast compression;Hand pump;Skin to skin;Hand express;Expressed milk;Education;Pre-pump if needed;Coconut oil;Shells  Discharge Discharge Education: Engorgement and breast care;Warning signs for feeding baby Pump: Personal;Manual WIC Program: Yes  Consult Status Consult Status: Follow-up Date: 09/28/20 Follow-up type: In-patient    Brittney Khan  Brittney Khan 09/27/2020, 2:25 PM

## 2020-09-27 NOTE — Progress Notes (Signed)
CRITICAL VALUE STICKER  CRITICAL VALUE: 6.8 Hemoglobin  RECEIVER (on-site recipient of call): Erasmo Leventhal, RN  DATE & TIME NOTIFIED: 09/26/20  2003  MESSENGER (representative from lab): John  MD NOTIFIED: Lynnda Shields MD  TIME OF NOTIFICATION: 2004  RESPONSE: new orders given; pt to receive blood transfusion

## 2020-09-29 ENCOUNTER — Inpatient Hospital Stay (HOSPITAL_COMMUNITY): Admit: 2020-09-29 | Payer: Self-pay

## 2020-09-29 ENCOUNTER — Telehealth: Payer: Self-pay | Admitting: Lactation Services

## 2020-09-29 MED ORDER — LISINOPRIL 10 MG PO TABS: 10.0000 mg | ORAL_TABLET | Freq: Every day | ORAL | 1 refills | Status: DC

## 2020-09-29 NOTE — Telephone Encounter (Signed)
Called patient in response to My Chart message.   Patient reports constant headache since she went in labor. It is still hurting really bad.   She is taking Tylenol this morning and last night. She is taking Ibuprofen every 6 hours. She took a muscle relaxant yesterday and it did not help at all.   She denies blurred vision and dizziness.   She is also having pain in the back of her neck when moving or trying to latch infant.   She reports her last BP was in the 130's over 90's. Was able to get her to take her BP  She is having swelling in her feet, the swelling is more than when patient left the hospital. She is urinating well and drinking well her patient. Reviewed post partum swelling is not uncommon.   Infant is doing well and he is gaining weight. Patient is trying to breast feeding but is finding it hard to pump and latch infant. Due to head and neck pain.   Patient checked BP while on the phone after sitting for 10 minutes. BP is 140/87 in left arm.  She is taking Norvasc daily and took at 8 am this morning.   She passed a large blood clot this morning, after getting out of bed this morning. She reports bleeding is normal overall and not too heavy.   Spoke with Dr. Shawnie Pons who recommend she stop the Norvasc and start Lisinopril.   Advised alternating Ibuprofen and Extra Strength Tylenol 1000 mg (taking each one every 6 hours). Reviewed using a cool cloth to head as needed.   Reviewed that is pain worsens or develops blurred vision or dizziness to go to MAU for evaluation. Offered to bring patient in to office today for BP check and she reports she is OK to wait until he scheduled appt on 6/30.

## 2020-10-02 ENCOUNTER — Ambulatory Visit (INDEPENDENT_AMBULATORY_CARE_PROVIDER_SITE_OTHER): Payer: No Typology Code available for payment source

## 2020-10-02 ENCOUNTER — Other Ambulatory Visit: Payer: Self-pay

## 2020-10-02 VITALS — BP 136/87 | HR 73 | Ht 65.0 in | Wt 186.8 lb

## 2020-10-02 DIAGNOSIS — O139 Gestational [pregnancy-induced] hypertension without significant proteinuria, unspecified trimester: Secondary | ICD-10-CM

## 2020-10-02 NOTE — Progress Notes (Signed)
Pt here today for PP 1 week BP check. Pt delivered vaginally on 09/25/20.  Pt states having headaches and took Motrin with no relief. Pt also states having no hearing in left ear since delivery. Pt states has not taken any BP meds for 3 days.   BP: 136/87    Reviewed BP and symptoms with Dr Barb Merino. Advised to start taking Lisinopril tonight and if symptoms/headache don't improve, then to be seen at MAU. Pt advised to get rest as well as staying hydrated. Pt verbalized understanding and agreeable to plan of care. Pt to go pharmacy now to pick up Lisinopril Rx.   Pt has PP visit scheduled for 10/30/20. Pt is aware and agreeable to date and time of appt.   Judeth Cornfield, RN

## 2020-10-04 ENCOUNTER — Other Ambulatory Visit: Payer: Self-pay

## 2020-10-04 ENCOUNTER — Inpatient Hospital Stay (HOSPITAL_COMMUNITY): Payer: Medicare Other

## 2020-10-04 ENCOUNTER — Inpatient Hospital Stay (HOSPITAL_COMMUNITY)
Admission: AD | Admit: 2020-10-04 | Discharge: 2020-10-06 | DRG: 776 | Disposition: A | Discharge status: Home / Self Care | Payer: Medicare Other | Attending: Obstetrics & Gynecology | Admitting: Obstetrics & Gynecology

## 2020-10-04 ENCOUNTER — Encounter (HOSPITAL_COMMUNITY): Payer: Self-pay | Admitting: Obstetrics & Gynecology

## 2020-10-04 DIAGNOSIS — R519 Headache, unspecified: Secondary | ICD-10-CM | POA: Diagnosis present

## 2020-10-04 DIAGNOSIS — Z20822 Contact with and (suspected) exposure to covid-19: Secondary | ICD-10-CM | POA: Diagnosis present

## 2020-10-04 DIAGNOSIS — I6203 Nontraumatic chronic subdural hemorrhage: Secondary | ICD-10-CM | POA: Diagnosis present

## 2020-10-04 DIAGNOSIS — O26899 Other specified pregnancy related conditions, unspecified trimester: Secondary | ICD-10-CM | POA: Diagnosis not present

## 2020-10-04 DIAGNOSIS — Z87891 Personal history of nicotine dependence: Secondary | ICD-10-CM | POA: Diagnosis not present

## 2020-10-04 DIAGNOSIS — O1415 Severe pre-eclampsia, complicating the puerperium: Secondary | ICD-10-CM | POA: Diagnosis present

## 2020-10-04 DIAGNOSIS — O99893 Other specified diseases and conditions complicating puerperium: Secondary | ICD-10-CM | POA: Diagnosis present

## 2020-10-04 LAB — RESP PANEL BY RT-PCR (FLU A&B, COVID) ARPGX2
Influenza A by PCR: NEGATIVE
Influenza B by PCR: NEGATIVE
SARS Coronavirus 2 by RT PCR: NEGATIVE

## 2020-10-04 LAB — URINALYSIS, ROUTINE W REFLEX MICROSCOPIC
Bilirubin Urine: NEGATIVE
Glucose, UA: NEGATIVE mg/dL
Ketones, ur: NEGATIVE mg/dL
Nitrite: NEGATIVE
Protein, ur: NEGATIVE mg/dL
Specific Gravity, Urine: 1.016 (ref 1.005–1.030)
WBC, UA: >50 WBC/hpf — ABNORMAL HIGH (ref 0–5)
pH: 5 (ref 5.0–8.0)

## 2020-10-04 LAB — CBC
HCT: 29.4 % — ABNORMAL LOW (ref 36.0–46.0)
Hemoglobin: 9.6 g/dL — ABNORMAL LOW (ref 12.0–15.0)
MCH: 27.6 pg (ref 26.0–34.0)
MCHC: 32.7 g/dL (ref 30.0–36.0)
MCV: 84.5 fL (ref 80.0–100.0)
Platelets: 474 10*3/uL — ABNORMAL HIGH (ref 150–400)
RBC: 3.48 MIL/uL — ABNORMAL LOW (ref 3.87–5.11)
RDW: 14.6 % (ref 11.5–15.5)
WBC: 11.1 10*3/uL — ABNORMAL HIGH (ref 4.0–10.5)
nRBC: 0 % (ref 0.0–0.2)

## 2020-10-04 LAB — COMPREHENSIVE METABOLIC PANEL
ALT: 28 U/L (ref 0–44)
AST: 14 U/L — ABNORMAL LOW (ref 15–41)
Albumin: 2.9 g/dL — ABNORMAL LOW (ref 3.5–5.0)
Alkaline Phosphatase: 152 U/L — ABNORMAL HIGH (ref 38–126)
Anion gap: 8 (ref 5–15)
BUN: 10 mg/dL (ref 6–20)
CO2: 26 mmol/L (ref 22–32)
Calcium: 8.8 mg/dL — ABNORMAL LOW (ref 8.9–10.3)
Chloride: 107 mmol/L (ref 98–111)
Creatinine, Ser: 0.97 mg/dL (ref 0.44–1.00)
GFR, Estimated: >60 mL/min (ref >60)
Glucose, Bld: 95 mg/dL (ref 70–99)
Potassium: 3.4 mmol/L — ABNORMAL LOW (ref 3.5–5.1)
Sodium: 141 mmol/L (ref 135–145)
Total Bilirubin: 0.6 mg/dL (ref 0.3–1.2)
Total Protein: 5.9 g/dL — ABNORMAL LOW (ref 6.5–8.1)

## 2020-10-04 MED ORDER — LABETALOL HCL 5 MG/ML IV SOLN: 40.0000 mg | INTRAVENOUS | Status: DC | PRN

## 2020-10-04 MED ORDER — PRENATAL MULTIVITAMIN CH
1.0000 | ORAL_TABLET | Freq: Every day | ORAL | Status: DC
  Administered 2020-10-05 – 2020-10-06 (×2): 1 via ORAL
  Filled 2020-10-04 (×2): qty 1

## 2020-10-04 MED ORDER — BUTALBITAL-APAP-CAFFEINE 50-325-40 MG PO TABS
2.0000 | ORAL_TABLET | Freq: Once | ORAL | Status: DC
  Administered 2020-10-04: 2 via ORAL
  Filled 2020-10-04: qty 2

## 2020-10-04 MED ORDER — LISINOPRIL 10 MG PO TABS
10.0000 mg | ORAL_TABLET | Freq: Every day | ORAL | Status: DC
  Administered 2020-10-04 – 2020-10-06 (×3): 10 mg via ORAL
  Filled 2020-10-04 (×3): qty 1

## 2020-10-04 MED ORDER — ONDANSETRON HCL 4 MG/2ML IJ SOLN: 4.0000 mg | Freq: Four times a day (QID) | INTRAMUSCULAR | Status: DC | PRN

## 2020-10-04 MED ORDER — OXYCODONE-ACETAMINOPHEN 5-325 MG PO TABS
1.0000 | ORAL_TABLET | ORAL | Status: DC | PRN
  Administered 2020-10-05: 2 via ORAL
  Filled 2020-10-04: qty 2

## 2020-10-04 MED ORDER — HYDRALAZINE HCL 20 MG/ML IJ SOLN
10.0000 mg | INTRAMUSCULAR | Status: DC | PRN
  Administered 2020-10-04: 10 mg via INTRAVENOUS

## 2020-10-04 MED ORDER — HYDRALAZINE HCL 20 MG/ML IJ SOLN
5.0000 mg | INTRAMUSCULAR | Status: DC | PRN
  Administered 2020-10-04: 5 mg via INTRAVENOUS
  Filled 2020-10-04: qty 1

## 2020-10-04 MED ORDER — ZOLPIDEM TARTRATE 5 MG PO TABS: 5.0000 mg | ORAL_TABLET | Freq: Every evening | ORAL | Status: DC | PRN

## 2020-10-04 MED ORDER — MAGNESIUM HYDROXIDE 400 MG/5ML PO SUSP: 30.0000 mL | Freq: Every day | ORAL | Status: DC | PRN

## 2020-10-04 MED ORDER — HYDROMORPHONE HCL 1 MG/ML IJ SOLN: 0.2000 mg | INTRAMUSCULAR | Status: DC | PRN

## 2020-10-04 MED ORDER — LABETALOL HCL 5 MG/ML IV SOLN: 20.0000 mg | INTRAVENOUS | Status: DC | PRN

## 2020-10-04 MED ORDER — FERROUS GLUCONATE 324 (38 FE) MG PO TABS
324.0000 mg | ORAL_TABLET | ORAL | Status: DC
  Administered 2020-10-05: 324 mg via ORAL
  Filled 2020-10-04 (×2): qty 1

## 2020-10-04 MED ORDER — IBUPROFEN 600 MG PO TABS
600.0000 mg | ORAL_TABLET | Freq: Four times a day (QID) | ORAL | Status: DC | PRN
  Administered 2020-10-05: 600 mg via ORAL
  Filled 2020-10-04: qty 1

## 2020-10-04 MED ORDER — MAGNESIUM CITRATE PO SOLN: 1.0000 | Freq: Once | ORAL | Status: DC | PRN

## 2020-10-04 MED ORDER — DOCUSATE SODIUM 100 MG PO CAPS
100.0000 mg | ORAL_CAPSULE | Freq: Two times a day (BID) | ORAL | Status: DC
  Administered 2020-10-04 – 2020-10-06 (×4): 100 mg via ORAL
  Filled 2020-10-04 (×4): qty 1

## 2020-10-04 MED ORDER — BISACODYL 5 MG PO TBEC: 5.0000 mg | DELAYED_RELEASE_TABLET | Freq: Every day | ORAL | Status: DC | PRN

## 2020-10-04 MED ORDER — MAGNESIUM SULFATE 40 GM/1000ML IV SOLN
2.0000 g/h | INTRAVENOUS | Status: AC
  Administered 2020-10-04 – 2020-10-05 (×2): 2 g/h via INTRAVENOUS
  Filled 2020-10-04 (×2): qty 1000

## 2020-10-04 MED ORDER — ALUM & MAG HYDROXIDE-SIMETH 200-200-20 MG/5ML PO SUSP: 30.0000 mL | ORAL | Status: DC | PRN

## 2020-10-04 MED ORDER — ONDANSETRON HCL 4 MG PO TABS: 4.0000 mg | ORAL_TABLET | Freq: Four times a day (QID) | ORAL | Status: DC | PRN

## 2020-10-04 MED ORDER — CYCLOBENZAPRINE HCL 10 MG PO TABS: 10.0000 mg | ORAL_TABLET | Freq: Three times a day (TID) | ORAL | Status: DC | PRN

## 2020-10-04 MED ORDER — LACTATED RINGERS IV SOLN
INTRAVENOUS | Status: DC
  Administered 2020-10-04: 1000 mL via INTRAVENOUS

## 2020-10-04 MED ORDER — MAGNESIUM SULFATE BOLUS VIA INFUSION
4.0000 g | Freq: Once | INTRAVENOUS | Status: AC
  Administered 2020-10-04: 4 g via INTRAVENOUS
  Filled 2020-10-04: qty 1000

## 2020-10-04 NOTE — MAU Note (Signed)
Up to bathroom for pericare.

## 2020-10-04 NOTE — H&P (Signed)
History     CSN: [id number]  Arrival date and time: 10/04/20 1847   Event Date/Time   First Provider Initiated Contact with Patient 10/04/20 1956      Chief Complaint  Patient presents with   Headache   HPI Brittney Khan is a 35 y.o. G2P1011 postpartum from a vaginal delivery on 6/23 who presents with a headache. She states the headache has persisted since delivery. She rates the pain a 10/10. She states she has taken her blood pressure medicine today. She denies any visual changes or epigastric pain. She is tearful because of the headache.   OB History     Gravida  2   Para  1   Term  1   Preterm      AB  1   Living  1      SAB  1   IAB      Ectopic      Multiple  0   Live Births  1           Past Medical History:  Diagnosis Date   Asthma    Hip fracture, right (HCC) 2010   Nausea vomiting and diarrhea 12/23/2012    Past Surgical History:  Procedure Laterality Date   NO PAST SURGERIES      Family History  Problem Relation Age of Onset   Heart disease Father    Arthritis Maternal Grandmother    Cancer Maternal Grandmother    Diabetes Maternal Grandmother    Hypertension Maternal Grandmother     Social History   Tobacco Use   Smoking status: Former    Pack years: 0.00    Types: Cigarettes    Quit date: 04/05/2010    Years since quitting: 10.5   Smokeless tobacco: Never  Vaping Use   Vaping Use: Never used  Substance Use Topics   Alcohol use: Yes    Alcohol/week: 0.0 standard drinks    Comment: socially   Drug use: Not Currently    Frequency: 1.0 times per week    Types: Marijuana    Comment: last used when i found out i was preg.     Allergies:  Allergies  Allergen Reactions   Reglan [Metoclopramide] Anxiety    Medications Prior to Admission  Medication Sig Dispense Refill Last Dose   acetaminophen (TYLENOL) 325 MG tablet Take 2 tablets (650 mg total) by mouth every 4 (four) hours.   10/03/2020   cyclobenzaprine  (FLEXERIL) 10 MG tablet Take 1 tablet (10 mg total) by mouth every 8 (eight) hours as needed for muscle spasms. 30 tablet 0 Past Week   ibuprofen (ADVIL) 600 MG tablet Take 1 tablet (600 mg total) by mouth every 6 (six) hours. 30 tablet 0 Past Week   lisinopril (PRINIVIL) 10 MG tablet Take 1 tablet (10 mg total) by mouth daily. 30 tablet 1 10/03/2020   pantoprazole (PROTONIX) 20 MG tablet Take 1 tablet (20 mg total) by mouth 2 (two) times daily. 60 tablet 0 Past Week   Prenatal Vit-Fe Fumarate-FA (PRENATAL MULTIVITAMIN) TABS tablet Take 1 tablet by mouth daily at 12 noon.   Past Week   amLODipine (NORVASC) 5 MG tablet Take 1 tablet (5 mg total) by mouth daily. (Patient not taking: Reported on 10/02/2020) 30 tablet 2    Ferrous Gluconate 324 (37.5 Fe) MG TABS Take 1 tablet (324 mg total) by mouth every other day. 60 tablet 1     Review of Systems  Constitutional: Negative.  Negative for fatigue and fever.  HENT: Negative.    Respiratory: Negative.  Negative for shortness of breath.   Cardiovascular: Negative.  Negative for chest pain.  Gastrointestinal: Negative.  Negative for abdominal pain, constipation, diarrhea, nausea and vomiting.  Genitourinary: Negative.  Negative for dysuria, vaginal bleeding and vaginal discharge.  Neurological:  Positive for headaches. Negative for dizziness.  Physical Exam   Blood pressure (!) 163/90, pulse 63, temperature 98.2 F (36.8 C), resp. rate 16, height 5\' 5"  (1.651 m), weight 82.6 kg, SpO2 99 %, currently breastfeeding. Patient Vitals for the past 24 hrs:  BP Temp Pulse Resp SpO2 Height Weight  10/04/20 1942 (!) 163/90 -- 63 -- 99 % -- --  10/04/20 1940 -- 98.2 F (36.8 C) -- 16 -- 5\' 5"  (1.651 m) 82.6 kg    Physical Exam Vitals and nursing note reviewed.  Constitutional:      General: She is not in acute distress.    Appearance: She is well-developed.  HENT:     Head: Normocephalic.  Eyes:     Pupils: Pupils are equal, round, and reactive to  light.  Cardiovascular:     Rate and Rhythm: Normal rate and regular rhythm.     Heart sounds: Normal heart sounds.  Pulmonary:     Effort: Pulmonary effort is normal. No respiratory distress.     Breath sounds: Normal breath sounds.  Abdominal:     General: Bowel sounds are normal. There is no distension.     Palpations: Abdomen is soft.     Tenderness: There is no abdominal tenderness.  Skin:    General: Skin is warm and dry.  Neurological:     Mental Status: She is alert and oriented to person, place, and time.     Motor: No abnormal muscle tone.     Coordination: Coordination normal.     Deep Tendon Reflexes: Reflexes are normal and symmetric. Reflexes normal.  Psychiatric:        Behavior: Behavior normal.        Thought Content: Thought content normal.        Judgment: Judgment normal.    MAU Course  Procedures MDM Consulted with Dr. 12/05/20- recommends admission to [facility] for postpartum magnesium. Preeclampsia labs ordered but severe range BPs and HA at this time. Will also do head CT to rule out other causes of headache.   Plan of care discussed with patient at length. Patient verbalized understanding.   Assessment and Plan  -Postpartum preeclampsia  -Admit to [facility] -Care turned over to MD  [facility] CNM 10/04/2020, 7:56 PM

## 2020-10-04 NOTE — MAU Note (Addendum)
My head has been hurting since I delivered and seems to be getting worse. Unable to hear from L ear since I delivered. I have been taking ibuprofen and Tylenol and it is not helping. Last took meds yesterday. Labs drawn in Triage. Denies visual changes or epigastric pain

## 2020-10-05 DIAGNOSIS — O1415 Severe pre-eclampsia, complicating the puerperium: Secondary | ICD-10-CM | POA: Diagnosis not present

## 2020-10-05 DIAGNOSIS — O26899 Other specified pregnancy related conditions, unspecified trimester: Secondary | ICD-10-CM | POA: Diagnosis not present

## 2020-10-05 DIAGNOSIS — R519 Headache, unspecified: Secondary | ICD-10-CM

## 2020-10-05 DIAGNOSIS — I6203 Nontraumatic chronic subdural hemorrhage: Secondary | ICD-10-CM | POA: Diagnosis not present

## 2020-10-05 LAB — CBC WITH DIFFERENTIAL/PLATELET
Abs Immature Granulocytes: 0.12 10*3/uL — ABNORMAL HIGH (ref 0.00–0.07)
Basophils Absolute: 0 10*3/uL (ref 0.0–0.1)
Basophils Relative: 0 %
Eosinophils Absolute: 0.1 10*3/uL (ref 0.0–0.5)
Eosinophils Relative: 1 %
HCT: 30.2 % — ABNORMAL LOW (ref 36.0–46.0)
Hemoglobin: 9.9 g/dL — ABNORMAL LOW (ref 12.0–15.0)
Immature Granulocytes: 1 %
Lymphocytes Relative: 16 %
Lymphs Abs: 1.7 10*3/uL (ref 0.7–4.0)
MCH: 27.5 pg (ref 26.0–34.0)
MCHC: 32.8 g/dL (ref 30.0–36.0)
MCV: 83.9 fL (ref 80.0–100.0)
Monocytes Absolute: 0.7 10*3/uL (ref 0.1–1.0)
Monocytes Relative: 6 %
Neutro Abs: 8.2 10*3/uL — ABNORMAL HIGH (ref 1.7–7.7)
Neutrophils Relative %: 76 %
Platelets: 469 10*3/uL — ABNORMAL HIGH (ref 150–400)
RBC: 3.6 MIL/uL — ABNORMAL LOW (ref 3.87–5.11)
RDW: 14.7 % (ref 11.5–15.5)
WBC: 10.8 10*3/uL — ABNORMAL HIGH (ref 4.0–10.5)
nRBC: 0 % (ref 0.0–0.2)

## 2020-10-05 LAB — COMPREHENSIVE METABOLIC PANEL
ALT: 24 U/L (ref 0–44)
AST: 11 U/L — ABNORMAL LOW (ref 15–41)
Albumin: 3 g/dL — ABNORMAL LOW (ref 3.5–5.0)
Alkaline Phosphatase: 160 U/L — ABNORMAL HIGH (ref 38–126)
Anion gap: 9 (ref 5–15)
BUN: 6 mg/dL (ref 6–20)
CO2: 25 mmol/L (ref 22–32)
Calcium: 7.9 mg/dL — ABNORMAL LOW (ref 8.9–10.3)
Chloride: 104 mmol/L (ref 98–111)
Creatinine, Ser: 0.83 mg/dL (ref 0.44–1.00)
GFR, Estimated: >60 mL/min (ref >60)
Glucose, Bld: 100 mg/dL — ABNORMAL HIGH (ref 70–99)
Potassium: 2.7 mmol/L — CL (ref 3.5–5.1)
Sodium: 138 mmol/L (ref 135–145)
Total Bilirubin: 0.3 mg/dL (ref 0.3–1.2)
Total Protein: 6.1 g/dL — ABNORMAL LOW (ref 6.5–8.1)

## 2020-10-05 MED ORDER — PREDNISONE 10 MG (21) PO TBPK
20.0000 mg | ORAL_TABLET | Freq: Every morning | ORAL | Status: AC
  Administered 2020-10-05: 20 mg via ORAL
  Filled 2020-10-05: qty 21

## 2020-10-05 MED ORDER — PREDNISONE 10 MG (21) PO TBPK
10.0000 mg | ORAL_TABLET | ORAL | Status: AC
  Administered 2020-10-05: 30 mg via ORAL

## 2020-10-05 MED ORDER — POTASSIUM CHLORIDE CRYS ER 20 MEQ PO TBCR
40.0000 meq | EXTENDED_RELEASE_TABLET | Freq: Two times a day (BID) | ORAL | Status: DC
  Administered 2020-10-05 – 2020-10-06 (×3): 40 meq via ORAL
  Filled 2020-10-05 (×3): qty 2

## 2020-10-05 MED ORDER — HYDROCHLOROTHIAZIDE 12.5 MG PO CAPS
12.5000 mg | ORAL_CAPSULE | Freq: Every day | ORAL | Status: DC
  Administered 2020-10-05 – 2020-10-06 (×2): 12.5 mg via ORAL
  Filled 2020-10-05 (×2): qty 1

## 2020-10-05 MED ORDER — IOHEXOL 350 MG/ML SOLN
50.0000 mL | Freq: Once | INTRAVENOUS | Status: AC | PRN
  Administered 2020-10-05: 50 mL via INTRAVENOUS

## 2020-10-05 MED ORDER — PREDNISONE 10 MG (21) PO TBPK: 20.0000 mg | ORAL_TABLET | Freq: Every evening | ORAL | Status: DC

## 2020-10-05 MED ORDER — PREDNISONE 10 MG (21) PO TBPK
10.0000 mg | ORAL_TABLET | ORAL | Status: AC
  Administered 2020-10-05: 10 mg via ORAL

## 2020-10-05 MED ORDER — PREDNISONE 10 MG (21) PO TBPK
10.0000 mg | ORAL_TABLET | Freq: Three times a day (TID) | ORAL | Status: DC
  Administered 2020-10-06: 10 mg via ORAL

## 2020-10-05 MED ORDER — PREDNISONE 10 MG (21) PO TBPK
20.0000 mg | ORAL_TABLET | Freq: Every evening | ORAL | Status: AC
  Administered 2020-10-05: 20 mg via ORAL

## 2020-10-05 MED ORDER — PREDNISONE 10 MG (21) PO TBPK: 10.0000 mg | ORAL_TABLET | Freq: Four times a day (QID) | ORAL | Status: DC

## 2020-10-05 NOTE — Progress Notes (Addendum)
Faculty Practice OB/GYN Attending Note  Subjective:  Reports mild headache this morning, markedly improved from admission. Patient denies any  isual symptoms, RUQ/epigastric pain or other concerning symptoms.    Objective:  Blood pressure 140/89, pulse 74, temperature 98 F (36.7 C), temperature source Oral, resp. rate 16, height 5\' 5"  (1.651 m), weight 82.6 kg, SpO2 95 %, currently breastfeeding. Vitals:   10/05/20 0305 10/05/20 0400 10/05/20 0500 10/05/20 0600  BP: (!) 143/85 (!) 141/83 (!) 137/93 140/89  Pulse: 86 88 86 74  Temp: 97.8 F (36.6 C)   98 F (36.7 C)  Resp: 16 18  16   Height:      Weight:      SpO2:  95%    TempSrc: Oral   Oral  BMI (Calculated):       Gen: NAD Neuro: A+O x 3, no deficits HENT: Normocephalic, atraumatic Lungs: Normal respiratory effort Heart: Regular rate noted Abdomen: NT, soft Cervix: Deferred Ext: 2+ DTRs, no edema, no cyanosis, negative Homan's sign  Studies: CBC Latest Ref Rng & Units 10/05/2020 10/04/2020 09/27/2020  WBC 4.0 - 10.5 K/uL 10.8(H) 11.1(H) 16.6(H)  Hemoglobin 12.0 - 15.0 g/dL 12/05/2020) 09/29/2020) 7.7(L)  Hematocrit 36.0 - 46.0 % 30.2(L) 29.4(L) 24.3(L)  Platelets 150 - 400 K/uL 469(H) 474(H) 268   CMP Latest Ref Rng & Units 10/05/2020 10/04/2020 09/26/2020  Glucose 70 - 99 mg/dL 12/05/2020) 95 87  BUN 6 - 20 mg/dL 6 10 5(L)  Creatinine 09/28/2020 - 1.00 mg/dL 378(H 8.85 0.27  Sodium 135 - 145 mmol/L 138 141 138  Potassium 3.5 - 5.1 mmol/L 2.7(LL) 3.4(L) 3.4(L)  Chloride 98 - 111 mmol/L 104 107 109  CO2 22 - 32 mmol/L 25 26 24   Calcium 8.9 - 10.3 mg/dL 7.9(L) 8.8(L) 9.0  Total Protein 6.5 - 8.1 g/dL 6.1(L) 5.9(L) 5.1(L)  Total Bilirubin 0.3 - 1.2 mg/dL 0.3 0.6 0.5  Alkaline Phos 38 - 126 U/L 160(H) 152(H) 173(H)  AST 15 - 41 U/L 11(L) 14(L) 20  ALT 0 - 44 U/L 24 28 15    CT VENOGRAM HEAD  Result Date: 10/05/2020 CLINICAL DATA:  Headache EXAM: CT VENOGRAM HEAD TECHNIQUE: Venographic phase images of the brain were obtained following the  administration of intravenous contrast. Multiplanar reformats and maximum intensity projections were generated. CONTRAST:  45mL OMNIPAQUE IOHEXOL 350 MG/ML SOLN COMPARISON:  None. FINDINGS: Brain: There are bilateral low-density subdural collections over both convexities. The right-sided collection measures 5 mm in thickness. The left measures 3 mm. No midline shift or other mass effect. There is no acute hemorrhage. Vascular: No abnormal hyperdensity of the major intracranial arteries or dural venous sinuses. No intracranial atherosclerosis. Skull: The visualized skull base, calvarium and extracranial soft tissues are normal. Sinuses/Orbits: No fluid levels or advanced mucosal thickening of the visualized paranasal sinuses. No mastoid or middle ear effusion. The orbits are normal. Superior sagittal sinus: Normal. Straight sinus: Normal. Inferior sagittal sinus, vein of Galen and internal cerebral veins: Normal. Transverse sinuses: Normal. Sigmoid sinuses: Normal. Visualized jugular veins: Normal. IMPRESSION: 1. Bilateral low-density subdural collections over both convexities, consistent with chronic subdural hematomas. 2. No dural venous sinus thrombosis. These results will be called to the ordering clinician or representative by the Radiologist Assistant, and communication documented in the PACS or . Electronically Signed   By: M.D.   On: 10/05/2020 00:13    Assessment & Plan:  35 y.o. 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   - Continue prednisone dosepack, analgesia. Appreciate Neurosurgery recommendations. - Continue magnesium sulfate x 24 hours for eclampsia prophylaxis, continue Lisinopril-HCTZ 10/12.5 mg for BP control and titrate as needed. - Kdur ordered for repletion, recheck labs in am - Continue close observation and inpatient care. - Consider discharge tomorrow if  stable, will need outpatient Neurosurgery follow up and repeat CT scan in 2 weeks.   Jaynie Collins, MD, FACOG Obstetrician & Gynecologist, [facility] for [employer], [facility]

## 2020-10-05 NOTE — Progress Notes (Signed)
    Faculty Practice OB/GYN Attending Note  Patient returned from CT scan, still reporting 5/10 headache. No visual symptoms. Tolerating magnesium sulfate infusion well.   CT VENOGRAM HEAD  Result Date: 10/05/2020 CLINICAL DATA:  Headache EXAM: CT VENOGRAM HEAD TECHNIQUE: Venographic phase images of the brain were obtained following the administration of intravenous contrast. Multiplanar reformats and maximum intensity projections were generated. CONTRAST:  24mL OMNIPAQUE IOHEXOL 350 MG/ML SOLN COMPARISON:  None. FINDINGS: Brain: There are bilateral low-density subdural collections over both convexities. The right-sided collection measures 5 mm in thickness. The left measures 3 mm. No midline shift or other mass effect. There is no acute hemorrhage. Vascular: No abnormal hyperdensity of the major intracranial arteries or dural venous sinuses. No intracranial atherosclerosis. Skull: The visualized skull base, calvarium and extracranial soft tissues are normal. Sinuses/Orbits: No fluid levels or advanced mucosal thickening of the visualized paranasal sinuses. No mastoid or middle ear effusion. The orbits are normal. Superior sagittal sinus: Normal. Straight sinus: Normal. Inferior sagittal sinus, vein of Galen and internal cerebral veins: Normal. Transverse sinuses: Normal. Sigmoid sinuses: Normal. Visualized jugular veins: Normal. IMPRESSION: 1. Bilateral low-density subdural collections over both convexities, consistent with chronic subdural hematomas. 2. No dural venous sinus thrombosis. These results will be called to the ordering clinician or representative by the Radiologist Assistant, and communication documented in the PACS or [employer]. Electronically Signed   By: Deatra Robinson M.D.   On: 10/05/2020 00:13    Discussed results with Dr. Conchita Paris (Neurosurgery); appreciate his note and recommendations. Findings consistent with chronic subdural hematomas, have been there for weeks likely. Of  note, on chart review and talking to patient, she first reported severe headaches around 09/21/2020.  Patient has no neurological deficits. As such, medical management was recommended with prednisone dose pack, antihypertensives, analgesics.  She will also need to follow up in 2 weeks as outpatient for repeat imaging.    These results and recommendations were discussed with patient and her RN, all questions answered. Orders entered as recommended. Added HCTZ 12.5 mg to Lisinopril 10 mg for better BP control. Will continue magnesium sulfate eclampsia prophylaxis x 24 hours as per protocol. Oxycodone ordered as needed for severe pain.  Will continue close monitoring.    Jaynie Collins, MD, FACOG Obstetrician & Gynecologist, [facility] for [employer], [facility]

## 2020-10-05 NOTE — Progress Notes (Signed)
Called RE this patient presenting with continued HA since at least one week ago, possible since mid-Jun. She is approx. 1wk post-partum with eclampsia. Currently neurologically intact with CTV demonstrating no venous sinus occlusion but bilateral convexity hypodensities. HU suggest chronic SDH although cannot r/o hygroma. Nonetheless, no significant local mass effect, MLS. No HCP. Would therefore treat symptomatically and monitor in the outpatient clinic. Would suggest prednisone dosepack, antihypertensives, mild analgesics, and office f/u in 2 weeks with repeat CTH.  Lisbeth Renshaw, MD [facility]

## 2020-10-05 NOTE — Lactation Note (Addendum)
Lactation Consultation Note  Patient Name: Brittney Khan Date: 10/05/2020 Reason for consult: Initial assessment (Readmit) Age:35 y.o.  LC in to room for initial consult after readmission. DEBP proactively set up by RN. Mother states she has pumped only 3 or 4 times since admission. Mother explains she is collecting ~240 mL per pumping session. LC praised mother for milk supply. Encouraged mother to protect milk supply aiming for 8 pumping sessions/day. Discussed milk storage and compared frozen vs refrigerated.  Reinforced contacting LC for any questions or concerns.   Feeding Mother's Current Feeding Choice: Breast Milk  Lactation Tools Discussed/Used Tools: Pump Breast pump type: Double-Electric Breast Pump;Manual Pump Education: Milk Storage;Setup, frequency, and cleaning Reason for Pumping: Maternal infant separation Pumping frequency: Encouraged to pump every 3-4h Pumped volume: 240 mL (approximately)  Interventions Interventions: Education;Expressed milk;DEBP  Discharge Discharge Education: Engorgement and breast care Pump: DEBP;Personal;Manual WIC Program: Yes  Consult Status Consult Status: Follow-up Date: 10/06/20 Follow-up type: In-patient    Yasmin Bronaugh A Higuera Ancidey 10/05/2020, 12:02 PM

## 2020-10-06 DIAGNOSIS — O1415 Severe pre-eclampsia, complicating the puerperium: Principal | ICD-10-CM

## 2020-10-06 DIAGNOSIS — I6203 Nontraumatic chronic subdural hemorrhage: Secondary | ICD-10-CM | POA: Diagnosis not present

## 2020-10-06 LAB — COMPREHENSIVE METABOLIC PANEL
ALT: 21 U/L (ref 0–44)
AST: 11 U/L — ABNORMAL LOW (ref 15–41)
Albumin: 2.9 g/dL — ABNORMAL LOW (ref 3.5–5.0)
Alkaline Phosphatase: 152 U/L — ABNORMAL HIGH (ref 38–126)
Anion gap: 5 (ref 5–15)
BUN: 9 mg/dL (ref 6–20)
CO2: 25 mmol/L (ref 22–32)
Calcium: 7.4 mg/dL — ABNORMAL LOW (ref 8.9–10.3)
Chloride: 105 mmol/L (ref 98–111)
Creatinine, Ser: 0.84 mg/dL (ref 0.44–1.00)
GFR, Estimated: >60 mL/min (ref >60)
Glucose, Bld: 122 mg/dL — ABNORMAL HIGH (ref 70–99)
Potassium: 4.1 mmol/L (ref 3.5–5.1)
Sodium: 135 mmol/L (ref 135–145)
Total Bilirubin: 0.5 mg/dL (ref 0.3–1.2)
Total Protein: 5.7 g/dL — ABNORMAL LOW (ref 6.5–8.1)

## 2020-10-06 MED ORDER — PREDNISONE 10 MG (21) PO TBPK: ORAL_TABLET | ORAL | 0 refills | Status: DC

## 2020-10-06 MED ORDER — HYDROCHLOROTHIAZIDE 12.5 MG PO CAPS: 12.5000 mg | ORAL_CAPSULE | Freq: Every day | ORAL | 1 refills | Status: DC

## 2020-10-06 NOTE — Progress Notes (Signed)
0950_ Dr Macon Large called to ask for clarification on her prednisone. Patient was started on her Dose Pack in the middle of the nights and orders state that she is due at lunchtime, her next dose in the dose pack is 2 pills at bedtime. Per Dr. Macon Large, patient will get on track with her dose pack at bedtime tonight and then take as the remaining dose pack states. Reviewed with patient that her next dose was 2 pills at bedtime and then take as directed and patient verbalized understanding.  Reviewed with patient s/s listed in AVS to return to hospital to be evaluated (h/a, visual changes, numbness and tingling in extremities, changes in mood, Headaches. Nausea or vomiting Changes in vision, such as double vision or loss of vision. Changes in speech or trouble understanding what people say. Loss of balance or trouble walking. Weakness, numbness, or tingling in the arms or legs, especially on one side of the body. Seizures. Change in personality. Increased sleepiness. Memory loss. Loss of consciousness. Coma. Reviewed with patient her meds. Patient to make her F/U appoint with neuro within 2 weeks. Patient verbalizes understanding of discharge instructions including F/U, meds, and returning.

## 2020-10-06 NOTE — Lactation Note (Signed)
Lactation Consultation Note  Patient Name: Brittney Khan [id number] Date: 10/06/2020 Reason for consult: Follow-up assessment (readmssion due to BP and headache) Age:35 y.o.  Mom states she feels good about pumping at this point.  She has 4 bottles in the refrigerator with 3-4 ounces of EBM in each.  Mom plans to breastfeed infant when she returns home.  She will not return to work any time soon.  She is excited to feed her baby at the breast now that her headache has resolved.    LC reviewed tips on good positioning and also a recommendation to pump if her baby was not at the breast and bottle feeding.     Supply and demand reviewed.   She denies any issues with pumping but did have several questions regarding milk storage, pump parts, ect.   All questions answered.  LC provided mom with support group information.    Maternal Data    Feeding Mother's Current Feeding Choice: Breast Milk  LATCH Score                    Lactation Tools Discussed/Used Tools: Pump Breast pump type: Double-Electric Breast Pump Pump Education: Setup, frequency, and cleaning;Milk Storage Reason for Pumping: Maternal infant separation Pumping frequency: 3-4 hours Pumped volume: 120 mL (approx. 3 oz. per pumping session)  Interventions    Discharge Discharge Education: Engorgement and breast care Pump: DEBP;Personal  Consult Status Consult Status: Complete Date: 10/06/20 Follow-up type: In-patient    Maryruth Hancock [facility] 10/06/2020, 9:13 AM

## 2020-10-06 NOTE — Discharge Summary (Signed)
Physician Discharge Summary  Patient ID: Brittney Khan MRN: [id number] DOB/AGE: Nov 30, 1985 35 y.o.  Admit date: 10/04/2020 Discharge date: 10/06/2020  Admission Diagnoses: pp severe preeclampsia  Discharge Diagnoses:  Principal Problem:   Severe preeclampsia, postpartum Active Problems:   Headache in pregnancy, severe, delivered with postpartum complication   Chronic subdural hematoma [facility])   Discharged Condition: good  Hospital Course: Patient admitted and treated for postpartum preeclampsia. BP improved on lisinopril and HCTZ. Patient with persistent headaches and head CT revealed a chronic subdural hematoma. Neurology consultation recommended steroid taper which significantly improved the headaches. Patient found stable for discharge home with plans to follow up with Neurology in 2 weeks.   Consults: neurology    Discharge Exam: Blood pressure 139/78, pulse 63, temperature 98.8 F (37.1 C), temperature source Oral, resp. rate 16, height 5\' 5"  (1.651 m), weight 82.6 kg, SpO2 99 %, currently breastfeeding. GENERAL: Well-developed, well-nourished female in no acute distress.  LUNGS: Clear to auscultation bilaterally.  HEART: Regular rate and rhythm. ABDOMEN: Soft, nontender, nondistended. No organomegaly. EXTREMITIES: No cyanosis, clubbing, or edema, 2+ distal pulses.   Disposition:  There are no questions and answers to display.         Allergies as of 10/06/2020       Reactions   Reglan [metoclopramide] Anxiety        Medication List     TAKE these medications    acetaminophen 325 MG tablet Commonly known as: Tylenol Take 2 tablets (650 mg total) by mouth every 4 (four) hours.   cyclobenzaprine 10 MG tablet Commonly known as: FLEXERIL Take 1 tablet (10 mg total) by mouth every 8 (eight) hours as needed for muscle spasms.   Ferrous Gluconate 324 (37.5 Fe) MG Tabs Take 1 tablet (324 mg total) by mouth every other day.   hydrochlorothiazide 12.5 MG  capsule Commonly known as: MICROZIDE Take 1 capsule (12.5 mg total) by mouth daily.   ibuprofen 600 MG tablet Commonly known as: ADVIL Take 1 tablet (600 mg total) by mouth every 6 (six) hours.   lisinopril 10 MG tablet Commonly known as: Prinivil Take 1 tablet (10 mg total) by mouth daily.   pantoprazole 20 MG tablet Commonly known as: Protonix Take 1 tablet (20 mg total) by mouth 2 (two) times daily.   predniSONE 10 MG (21) Tbpk tablet Commonly known as: STERAPRED UNI-PAK 21 TAB Take as prescribed   predniSONE 10 MG (21) Tbpk tablet Commonly known as: STERAPRED UNI-PAK 21 TAB Take as prescribed   predniSONE 10 MG (21) Tbpk tablet Commonly known as: STERAPRED UNI-PAK 21 TAB Take as prescribed Start taking on: October 07, 2020   prenatal multivitamin Tabs tablet Take 1 tablet by mouth daily at 12 noon.        Follow-up Information     [place], October 09, [facility] Follow up.   Specialty: Neurosurgery Why: Please call the schedule a follo wup appointment in 2 weeks Contact information: [address] [place] [phone]                 Signed: [phone] 10/06/2020, 7:49 AM

## 2020-10-07 ENCOUNTER — Other Ambulatory Visit: Payer: Self-pay | Admitting: Family Medicine

## 2020-10-07 ENCOUNTER — Inpatient Hospital Stay (HOSPITAL_COMMUNITY)
Admission: AD | Admit: 2020-10-07 | Discharge: 2020-10-07 | Disposition: A | Discharge status: Home / Self Care | Payer: No Typology Code available for payment source | Attending: Family Medicine | Admitting: Family Medicine

## 2020-10-07 ENCOUNTER — Telehealth: Payer: Self-pay | Admitting: *Deleted

## 2020-10-07 ENCOUNTER — Telehealth: Payer: Self-pay | Admitting: Family Medicine

## 2020-10-07 ENCOUNTER — Encounter (HOSPITAL_COMMUNITY): Payer: Self-pay | Admitting: Family Medicine

## 2020-10-07 ENCOUNTER — Inpatient Hospital Stay (HOSPITAL_COMMUNITY): Payer: No Typology Code available for payment source

## 2020-10-07 ENCOUNTER — Other Ambulatory Visit: Payer: Self-pay

## 2020-10-07 DIAGNOSIS — O9089 Other complications of the puerperium, not elsewhere classified: Secondary | ICD-10-CM | POA: Diagnosis not present

## 2020-10-07 DIAGNOSIS — O99893 Other specified diseases and conditions complicating puerperium: Secondary | ICD-10-CM

## 2020-10-07 DIAGNOSIS — O1415 Severe pre-eclampsia, complicating the puerperium: Secondary | ICD-10-CM

## 2020-10-07 DIAGNOSIS — Z79899 Other long term (current) drug therapy: Secondary | ICD-10-CM | POA: Diagnosis not present

## 2020-10-07 DIAGNOSIS — R519 Headache, unspecified: Secondary | ICD-10-CM | POA: Insufficient documentation

## 2020-10-07 DIAGNOSIS — I6203 Nontraumatic chronic subdural hemorrhage: Secondary | ICD-10-CM

## 2020-10-07 DIAGNOSIS — O1495 Unspecified pre-eclampsia, complicating the puerperium: Secondary | ICD-10-CM | POA: Insufficient documentation

## 2020-10-07 DIAGNOSIS — O26899 Other specified pregnancy related conditions, unspecified trimester: Secondary | ICD-10-CM

## 2020-10-07 LAB — COMPREHENSIVE METABOLIC PANEL
ALT: 31 U/L (ref 0–44)
AST: 53 U/L — ABNORMAL HIGH (ref 15–41)
Albumin: 3.6 g/dL (ref 3.5–5.0)
Alkaline Phosphatase: 174 U/L — ABNORMAL HIGH (ref 38–126)
Anion gap: 12 (ref 5–15)
BUN: 9 mg/dL (ref 6–20)
CO2: 22 mmol/L (ref 22–32)
Calcium: 8.8 mg/dL — ABNORMAL LOW (ref 8.9–10.3)
Chloride: 99 mmol/L (ref 98–111)
Creatinine, Ser: 0.91 mg/dL (ref 0.44–1.00)
GFR, Estimated: >60 mL/min (ref >60)
Glucose, Bld: 193 mg/dL — ABNORMAL HIGH (ref 70–99)
Potassium: 5.1 mmol/L (ref 3.5–5.1)
Sodium: 133 mmol/L — ABNORMAL LOW (ref 135–145)
Total Bilirubin: 0.3 mg/dL (ref 0.3–1.2)
Total Protein: 6.5 g/dL (ref 6.5–8.1)

## 2020-10-07 LAB — CBC WITH DIFFERENTIAL/PLATELET
Abs Immature Granulocytes: 0.05 10*3/uL (ref 0.00–0.07)
Basophils Absolute: 0 10*3/uL (ref 0.0–0.1)
Basophils Relative: 0 %
Eosinophils Absolute: 0 10*3/uL (ref 0.0–0.5)
Eosinophils Relative: 0 %
HCT: 34.4 % — ABNORMAL LOW (ref 36.0–46.0)
Hemoglobin: 10.8 g/dL — ABNORMAL LOW (ref 12.0–15.0)
Immature Granulocytes: 0 %
Lymphocytes Relative: 10 %
Lymphs Abs: 1.5 10*3/uL (ref 0.7–4.0)
MCH: 27.7 pg (ref 26.0–34.0)
MCHC: 31.4 g/dL (ref 30.0–36.0)
MCV: 88.2 fL (ref 80.0–100.0)
Monocytes Absolute: 0.6 10*3/uL (ref 0.1–1.0)
Monocytes Relative: 4 %
Neutro Abs: 12.6 10*3/uL — ABNORMAL HIGH (ref 1.7–7.7)
Neutrophils Relative %: 86 %
Platelets: 545 10*3/uL — ABNORMAL HIGH (ref 150–400)
RBC: 3.9 MIL/uL (ref 3.87–5.11)
RDW: 14.9 % (ref 11.5–15.5)
WBC: 14.8 10*3/uL — ABNORMAL HIGH (ref 4.0–10.5)
nRBC: 0 % (ref 0.0–0.2)

## 2020-10-07 MED ORDER — LISINOPRIL 10 MG PO TABS: 20.0000 mg | ORAL_TABLET | Freq: Every day | ORAL | 1 refills | Status: DC

## 2020-10-07 MED ORDER — DIPHENHYDRAMINE HCL 50 MG/ML IJ SOLN
25.0000 mg | Freq: Once | INTRAMUSCULAR | Status: AC
  Administered 2020-10-07: 25 mg via INTRAVENOUS
  Filled 2020-10-07: qty 1

## 2020-10-07 MED ORDER — PREDNISONE 10 MG PO TABS
10.0000 mg | ORAL_TABLET | Freq: Once | ORAL | Status: AC
  Administered 2020-10-07: 10 mg via ORAL
  Filled 2020-10-07: qty 1

## 2020-10-07 MED ORDER — METOCLOPRAMIDE HCL 5 MG/ML IJ SOLN
10.0000 mg | Freq: Once | INTRAMUSCULAR | Status: AC
  Administered 2020-10-07: 10 mg via INTRAVENOUS
  Filled 2020-10-07: qty 2

## 2020-10-07 MED ORDER — DEXAMETHASONE SODIUM PHOSPHATE 10 MG/ML IJ SOLN
10.0000 mg | Freq: Once | INTRAMUSCULAR | Status: AC
  Administered 2020-10-07: 10 mg via INTRAVENOUS
  Filled 2020-10-07: qty 1

## 2020-10-07 MED ORDER — MAGNESIUM SULFATE 2 GM/50ML IV SOLN
2.0000 g | Freq: Once | INTRAVENOUS | Status: AC
  Administered 2020-10-07: 2 g via INTRAVENOUS
  Filled 2020-10-07: qty 50

## 2020-10-07 MED ORDER — ACETAMINOPHEN 500 MG PO TABS
1000.0000 mg | ORAL_TABLET | Freq: Once | ORAL | Status: AC
  Administered 2020-10-07: 1000 mg via ORAL
  Filled 2020-10-07: qty 2

## 2020-10-07 MED ORDER — LACTATED RINGERS IV BOLUS
1000.0000 mL | Freq: Once | INTRAVENOUS | Status: AC
  Administered 2020-10-07: 1000 mL via INTRAVENOUS

## 2020-10-07 NOTE — Telephone Encounter (Signed)
Received second call from [employer] of BP 149/96 with headache. Per chart review Dr. Adrian Blackwater called and spoke with patient and [facility] [employer] nurse for BP 149/89  and he gave orders to increase lisinopril to 20 mg daily. See his note. Dejan Angert,RN

## 2020-10-07 NOTE — Telephone Encounter (Signed)
Called by patient with [facility] nurse. Patient had elevated BP: 149/89. Increase lisinopril to 20mg  daily. Patient is having a headache - if improves with BP control, then no further work up needed. If headache continues, then needs to come to MAU for evaluation.  Will arrange f/u with BP check in office in a week.

## 2020-10-07 NOTE — MAU Provider Note (Addendum)
History     CSN: [id number]  Arrival date and time: 10/07/20 1731   Event Date/Time   First Provider Initiated Contact with Patient 10/07/20 1825      Chief Complaint  Patient presents with   Hypertension   Headache  Pt returning to MAU for worsening HA. States she has had this HA since her delivery on 6/23. Of note, after her delivery, she developed severe preE and was found to have evidence of chronic subdural hematomas found on head CT. Neuro was consulted and recommended starting her on prednisone 10mg  TID x7 days with follow up in 2 weeks and repeat imaging. She was also started on HCTZ and lisinopril as well as tylenol PRN. Her home nurse did discuss w/ Dr. who rec to take another dose of lisinopril today, which she did.   States the only thing that has improved this HA is the medicine she got recently when she was seen in MAU, not sure what medicine this was, as she received a few. Seems to be worse when she moves her head. She does not have any history of Has. Has taken her home meds including tylenol, lisinopril, and her morning dose of prednisone.   Hypertension Associated symptoms include headaches. Pertinent negatives include no chest pain, palpitations or shortness of breath.  Headache  Associated symptoms include hearing loss. Pertinent negatives include no abdominal pain, coughing, ear pain, eye pain, nausea, photophobia, rhinorrhea, sore throat or vomiting. Her past medical history is significant for hypertension.    OB History     Gravida  2   Para  1   Term  1   Preterm      AB  1   Living  1      SAB  1   IAB      Ectopic      Multiple  0   Live Births  1           Past Medical History:  Diagnosis Date   Asthma    Hip fracture, right (HCC) 2010   Nausea vomiting and diarrhea 12/23/2012    Past Surgical History:  Procedure Laterality Date   NO PAST SURGERIES      Family History  Problem Relation Age of Onset   Heart disease  Father    Arthritis Maternal Grandmother    Cancer Maternal Grandmother    Diabetes Maternal Grandmother    Hypertension Maternal Grandmother     Social History   Tobacco Use   Smoking status: Former    Pack years: 0.00    Types: Cigarettes    Quit date: 04/05/2010    Years since quitting: 10.5   Smokeless tobacco: Never  Vaping Use   Vaping Use: Never used  Substance Use Topics   Alcohol use: Yes    Comment: socially, not since found out pregnant   Drug use: Not Currently    Frequency: 1.0 times per week    Types: Marijuana    Comment: last used when i found out i was preg.     Allergies:  Allergies  Allergen Reactions   Reglan [Metoclopramide] Anxiety    Medications Prior to Admission  Medication Sig Dispense Refill Last Dose   acetaminophen (TYLENOL) 325 MG tablet Take 2 tablets (650 mg total) by mouth every 4 (four) hours.      cyclobenzaprine (FLEXERIL) 10 MG tablet Take 1 tablet (10 mg total) by mouth every 8 (eight) hours as needed for muscle spasms. 30  tablet 0    Ferrous Gluconate 324 (37.5 Fe) MG TABS Take 1 tablet (324 mg total) by mouth every other day. 60 tablet 1    hydrochlorothiazide (MICROZIDE) 12.5 MG capsule Take 1 capsule (12.5 mg total) by mouth daily. 30 capsule 1    ibuprofen (ADVIL) 600 MG tablet Take 1 tablet (600 mg total) by mouth every 6 (six) hours. 30 tablet 0    lisinopril (PRINIVIL) 10 MG tablet Take 2 tablets (20 mg total) by mouth daily. 30 tablet 1    pantoprazole (PROTONIX) 20 MG tablet Take 1 tablet (20 mg total) by mouth 2 (two) times daily. 60 tablet 0    predniSONE (STERAPRED UNI-PAK 21 TAB) 10 MG (21) TBPK tablet Take as prescribed 3 tablet 0    predniSONE (STERAPRED UNI-PAK 21 TAB) 10 MG (21) TBPK tablet Take as prescribed 1 tablet 0    predniSONE (STERAPRED UNI-PAK 21 TAB) 10 MG (21) TBPK tablet Take as prescribed 10 tablet 0    Prenatal Vit-Fe Fumarate-FA (PRENATAL MULTIVITAMIN) TABS tablet Take 1 tablet by mouth daily at 12 noon.        Review of Systems  HENT:  Positive for hearing loss. Negative for congestion, ear discharge, ear pain, rhinorrhea and sore throat.        States some muffled hearing in her left ear that is intermittent the past few days   Eyes:  Positive for visual disturbance. Negative for photophobia and pain.       States yesterday with her HA she was seeing colors. But no visual disturbances now  Respiratory:  Negative for cough, chest tightness and shortness of breath.   Cardiovascular:  Negative for chest pain, palpitations and leg swelling.  Gastrointestinal:  Negative for abdominal pain, blood in stool, diarrhea, nausea and vomiting.  Genitourinary:  Negative for difficulty urinating and dysuria.  Musculoskeletal:  Negative for arthralgias.  Skin:  Negative for rash.  Neurological:  Positive for headaches. Negative for light-headedness.     Physical Exam   Blood pressure (!) 148/77, pulse 79, temperature 98.3 F (36.8 C), resp. rate 16, weight 81.2 kg, SpO2 100 %, currently breastfeeding.  Physical Exam Constitutional:      Appearance: She is well-developed.     Comments: Slightly tearful  HENT:     Head: Normocephalic and atraumatic.     Comments: Ears: able to hear light finger touch bilaterally, she stated slightly less on left (sounded muffled)    Mouth/Throat:     Mouth: Mucous membranes are moist.  Eyes:     General: No visual field deficit.    Extraocular Movements: Extraocular movements intact.     Right eye: No nystagmus.     Left eye: No nystagmus.     Comments: PERRLA, all visual fields intact   Cardiovascular:     Rate and Rhythm: Normal rate and regular rhythm.     Heart sounds: Normal heart sounds.  Pulmonary:     Effort: Pulmonary effort is normal.     Breath sounds: Normal breath sounds. No wheezing.  Abdominal:     General: Bowel sounds are normal. There is no distension.     Palpations: Abdomen is soft.     Tenderness: There is no abdominal tenderness.  There is no guarding.  Musculoskeletal:        General: No swelling or tenderness.  Skin:    Coloration: Skin is not pale.     Findings: No rash.  Neurological:     Mental  Status: She is alert and oriented to person, place, and time.     Cranial Nerves: No dysarthria or facial asymmetry.     Sensory: No sensory deficit.     Gait: Gait normal.     Comments: CN II-XII intact. Sensation to light touch intact and equal bilaterally from head to toe. Muscle strength 5/5 neck, shoulders, biceps, triceps, grip, leg, ankle. Patellar reflexes 1+ bilaterally.     MAU Course  Procedures  MDM  Concern for elevated Bps and this hx of recently possible subdural hematomas --LR 500cc bolus --tylenol 1000mg  --cont home prednisone 10mg  TID  --CBC, CMP pending  --Head CT w/out contrast pending  --frequent vitals  --spoke with Neurology, Dr. , who rec discussing w/ NeuroSurg, as well as migraine cocktail (diphenhydramine, decadron, metoclopramide, and IV Mg), advised to avoid NSAIDs --touch base with neurology after HA meds given --plan to discuss w/ Neurosurg after CT head results are back IF interval change  --cont with increase in lisinopril (20mg  daily) + HCTZ 12.5mg   --signed out to night team, NP  Assessment and Plan    Brittney Khan 10/07/2020, 6:51 PM   CT HEAD WO CONTRAST  Result Date: 10/07/2020 CLINICAL DATA:  Worsening headache with chronic subdural hematoma. EXAM: CT HEAD WITHOUT CONTRAST TECHNIQUE: Contiguous axial images were obtained from the base of the skull through the vertex without intravenous contrast. COMPARISON:  10/04/2020 FINDINGS: Brain: Small chronic subdural collections along the convexities are unchanged since prior study. No progression or acute hemorrhage. No significant mass effect or midline shift. Ventricles are not dilated or effaced. Gray-white matter junctions are intact. Basal cisterns are not effaced. Vascular: No hyperdense  vessel or unexpected calcification. Skull: Calvarium appears intact. Sinuses/Orbits: Paranasal sinuses and mastoid air cells are clear. Other: None. IMPRESSION: Small chronic subdural collections along the convexities are unchanged since prior study. No acute intracranial hemorrhage or mass effect. Electronically Signed   By: 12/08/2020 M.D.   On: 10/07/2020 20:04   CT VENOGRAM HEAD  Result Date: 10/05/2020 CLINICAL DATA:  Headache EXAM: CT VENOGRAM HEAD TECHNIQUE: Venographic phase images of the brain were obtained following the administration of intravenous contrast. Multiplanar reformats and maximum intensity projections were generated. CONTRAST:  5mL OMNIPAQUE IOHEXOL 350 MG/ML SOLN COMPARISON:  None. FINDINGS: Brain: There are bilateral low-density subdural collections over both convexities. The right-sided collection measures 5 mm in thickness. The left measures 3 mm. No midline shift or other mass effect. There is no acute hemorrhage. Vascular: No abnormal hyperdensity of the major intracranial arteries or dural venous sinuses. No intracranial atherosclerosis. Skull: The visualized skull base, calvarium and extracranial soft tissues are normal. Sinuses/Orbits: No fluid levels or advanced mucosal thickening of the visualized paranasal sinuses. No mastoid or middle ear effusion. The orbits are normal. Superior sagittal sinus: Normal. Straight sinus: Normal. Inferior sagittal sinus, vein of Galen and internal cerebral veins: Normal. Transverse sinuses: Normal. Sigmoid sinuses: Normal. Visualized jugular veins: Normal. IMPRESSION: 1. Bilateral low-density subdural collections over both convexities, consistent with chronic subdural hematomas. 2. No dural venous sinus thrombosis. These results will be called to the ordering clinician or representative by the Radiologist Assistant, and communication documented in the PACS or 12/08/2020. Electronically Signed   By: 12/06/2020 M.D.   On: 10/05/2020  00:13    Patient states she has gotten complete relief of headache Updated Neurologist who recommends discharge home and followup with Dr [employer] for headache management.  (Message sent to office to  refer) Discussed with Dr Charlotta Newton who recommends closed followup in office this Friday to recheck labs Creatinine was 0.91 AST was 53 (up from 11 2 days ago) Other labs normal   Vitals:   10/07/20 1816 10/07/20 1833 10/07/20 2146 10/07/20 2201  BP: (!) 153/93 (!) 148/77 (!) 149/93 (!) 149/89  Pulse: 82 79 (!) 59 (!) 59  Resp:      Temp:      SpO2:      Weight:       Encouraged to return if she develops worsening of symptoms, increase in pain, fever, or other concerning symptoms.   Brittney Khan, CNM

## 2020-10-07 NOTE — Telephone Encounter (Signed)
Received a phone call from [employer] patient has elevated bp at 136/93 with headache. Aveer Bartow,RN

## 2020-10-07 NOTE — MAU Note (Signed)
Brittney Khan is a 35 y.o. here in MAU reporting: s/p SVD on June 23. States last night after she was discharged her head started hurting again. Increased her lisinopril per Dr Adrian Blackwater and it did not help. Also reports her hearing is gone.  Onset of complaint: ongoing  Pain score: 7/10  Vitals:   10/07/20 1755  BP: (!) 148/92  Pulse: 72  Resp: 16  Temp: 98.3 F (36.8 C)  SpO2: 100%     Lab orders placed from triage: none

## 2020-10-08 ENCOUNTER — Telehealth (HOSPITAL_COMMUNITY): Payer: Self-pay | Admitting: *Deleted

## 2020-10-08 NOTE — Telephone Encounter (Signed)
Mom reports feeling much better. She had been readmitted for BP issues, but the meds are working well. EPDS = 0. No concerns. Mom reports baby is gaining weight, feeding well all breastmilk. Peeing and pooping well. No concerns. Duffy Rhody, RN 10/08/2020 at 10:15am

## 2020-10-09 ENCOUNTER — Telehealth: Payer: Self-pay

## 2020-10-09 NOTE — Telephone Encounter (Addendum)
-----   Message from Aviva Signs, CNM sent at 10/07/2020  9:48 PM EDT ----- Regarding: Need to refer her to Headache Specialist Has chronic Subdural hematoma Also had severe postpartum preeclampsia  Per Neurologist at hospital, needs to have appt with Dr Santiago Glad for followup   Thanks  [facility], staff member states pt has been scheduled for follow up appt 10/13/20 at 2 PM. [facility] (Dr. Onnie Boer office); they request records and insurance information. They will schedule pt for mid August (first available) if they accept pt's insurance. Called pt to review upcoming appts. Per Dr. Charlotta Newton, pt to follow up in office on Friday for labs. Pt agreeable to nurse visit for BP check and lab work 10/10/20 at 1040.

## 2020-10-10 ENCOUNTER — Other Ambulatory Visit: Payer: Self-pay

## 2020-10-10 ENCOUNTER — Ambulatory Visit (INDEPENDENT_AMBULATORY_CARE_PROVIDER_SITE_OTHER): Payer: No Typology Code available for payment source

## 2020-10-10 VITALS — BP 139/79 | HR 75 | Wt 177.0 lb

## 2020-10-10 DIAGNOSIS — O1415 Severe pre-eclampsia, complicating the puerperium: Secondary | ICD-10-CM

## 2020-10-10 DIAGNOSIS — Z0130 Encounter for examination of blood pressure without abnormal findings: Secondary | ICD-10-CM

## 2020-10-10 NOTE — Progress Notes (Signed)
Patient here for blood pressure check following readmission to the hospital due to severe pre-eclampsia in postpartum period. Patient is s/p spontaneous vaginal delivery on 09/25/20. Pre-e labs to be repeated today per Ozan, MD. BP today is 139/79; HR 75. Not taking lisinopril-- last taken 10/07/20; pt states she thought she was not supposed to take this medication because there was discussion in MAU about changing BP medication. Denies any HA since MAU visit on 10/07/20, pt is suspicious that lisinopril was causing headache. Denies other s/s of elevated BP. Trace edema to BLL. Reviewed pt history and BP today with Crissie Reese, MD who states patient may continue hydrochlorothiazide 12.5 daily. Does not recommend restarting lisinopril due to BP being well controlled without.   Patient has diagnosed chronic subdural hematoma. Reports finishing course of prednisone this morning that was recommended by neuro. Pt will follow up with [facility] on 10/13/20 and in August with [facility]. Will follow up for BP check with our office on 10/17/20. Labs drawn prior to patient leaving the office. Explained to pt she will be contacted with any abnormal results. Pt encouraged to monitor headache and BP at home and contact our office with any changes.   Fleet Contras RN 10/10/20

## 2020-10-10 NOTE — Progress Notes (Signed)
Chart reviewed for nurse visit. Agree with plan of care.   Venora Maples, MD 10/10/20 1:18 PM

## 2020-10-11 LAB — PROTEIN / CREATININE RATIO, URINE
Creatinine, Urine: 147 mg/dL
Protein, Ur: 14.7 mg/dL
Protein/Creat Ratio: 100 mg/g creat (ref 0–200)

## 2020-10-11 LAB — COMPREHENSIVE METABOLIC PANEL
ALT: 14 IU/L (ref 0–32)
AST: 11 IU/L (ref 0–40)
Albumin/Globulin Ratio: 2 (ref 1.2–2.2)
Albumin: 4.6 g/dL (ref 3.8–4.8)
Alkaline Phosphatase: 212 IU/L — ABNORMAL HIGH (ref 44–121)
BUN/Creatinine Ratio: 12 (ref 9–23)
BUN: 10 mg/dL (ref 6–20)
Bilirubin Total: 0.2 mg/dL (ref 0.0–1.2)
CO2: 25 mmol/L (ref 20–29)
Calcium: 9.7 mg/dL (ref 8.7–10.2)
Chloride: 103 mmol/L (ref 96–106)
Creatinine, Ser: 0.85 mg/dL (ref 0.57–1.00)
Globulin, Total: 2.3 g/dL (ref 1.5–4.5)
Glucose: 101 mg/dL — ABNORMAL HIGH (ref 65–99)
Potassium: 4.2 mmol/L (ref 3.5–5.2)
Sodium: 144 mmol/L (ref 134–144)
Total Protein: 6.9 g/dL (ref 6.0–8.5)
eGFR: 93 mL/min/{1.73_m2} (ref >59)

## 2020-10-11 LAB — CBC
Hematocrit: 33.5 % — ABNORMAL LOW (ref 34.0–46.6)
Hemoglobin: 10.9 g/dL — ABNORMAL LOW (ref 11.1–15.9)
MCH: 26.7 pg (ref 26.6–33.0)
MCHC: 32.5 g/dL (ref 31.5–35.7)
MCV: 82 fL (ref 79–97)
Platelets: 532 10*3/uL — ABNORMAL HIGH (ref 150–450)
RBC: 4.09 x10E6/uL (ref 3.77–5.28)
RDW: 13.2 % (ref 11.7–15.4)
WBC: 10.8 10*3/uL (ref 3.4–10.8)

## 2020-10-17 ENCOUNTER — Ambulatory Visit (INDEPENDENT_AMBULATORY_CARE_PROVIDER_SITE_OTHER): Payer: No Typology Code available for payment source | Admitting: *Deleted

## 2020-10-17 ENCOUNTER — Other Ambulatory Visit: Payer: Self-pay | Admitting: Neurosurgery

## 2020-10-17 ENCOUNTER — Other Ambulatory Visit: Payer: Self-pay

## 2020-10-17 VITALS — BP 135/99 | HR 72 | Ht 65.0 in | Wt 177.2 lb

## 2020-10-17 DIAGNOSIS — I10 Essential (primary) hypertension: Secondary | ICD-10-CM

## 2020-10-17 DIAGNOSIS — S065X9A Traumatic subdural hemorrhage with loss of consciousness of unspecified duration, initial encounter: Secondary | ICD-10-CM

## 2020-10-17 DIAGNOSIS — O1495 Unspecified pre-eclampsia, complicating the puerperium: Secondary | ICD-10-CM

## 2020-10-17 DIAGNOSIS — O1415 Severe pre-eclampsia, complicating the puerperium: Secondary | ICD-10-CM

## 2020-10-17 DIAGNOSIS — S065XAA Traumatic subdural hemorrhage with loss of consciousness status unknown, initial encounter: Secondary | ICD-10-CM

## 2020-10-17 MED ORDER — HYDROCHLOROTHIAZIDE 25 MG PO TABS: 25.0000 mg | ORAL_TABLET | Freq: Every day | ORAL | 0 refills | Status: DC

## 2020-10-17 NOTE — Progress Notes (Signed)
Here for bp check.  S/p readmitted postpartum for severe pre-eclampsia. Had bp check 10/10/20 and had not been taking lisinopril; but lisinopril was d/c'd and placed on HCTZ 12.5 mg po daily which she is taking and had dose at 0800.  She denies headache or edema or visual changes or epigastic pain.  She did follow up with [facility] for her chronic subdural hematoma and reports they said nothing can be done right now. Will have repeat CT scan in 3 weeks. BP today 134/106 and repeat 135/99. Reviewed patient assessment and bp with Dr. Crissie Reese. Discussed patient is breastfeeding. Orders given to increase HCTZ to 25 mg daily.  Bp check one week. Advised patient to be sure she is drinking lots of water, fluids and if she is having problem with decreased milk production or poor weight gain for baby to let us know as she may need to change bp meds. Also discussed after postpartum visit she will need to be followed by PCP for HTN management. She states she goes to [place] and will make appointment for August or after.  Cordero Surette,RN

## 2020-10-17 NOTE — Progress Notes (Signed)
Chart reviewed for nurse visit. Agree with plan of care.   Can switch to Nifedipine or Labetalol if milk supply becomes an issue.  Venora Maples, MD 10/17/20 11:25 AM

## 2020-10-24 ENCOUNTER — Ambulatory Visit (INDEPENDENT_AMBULATORY_CARE_PROVIDER_SITE_OTHER): Payer: No Typology Code available for payment source

## 2020-10-24 ENCOUNTER — Other Ambulatory Visit: Payer: Self-pay

## 2020-10-24 VITALS — BP 137/98 | HR 74 | Wt 177.0 lb

## 2020-10-24 DIAGNOSIS — Z0130 Encounter for examination of blood pressure without abnormal findings: Secondary | ICD-10-CM

## 2020-10-24 NOTE — Progress Notes (Signed)
Patient was assessed and managed by nursing staff during this encounter. I have reviewed the chart and agree with the documentation and plan.   Jaynie Collins, MD 10/24/2020 11:18 AM

## 2020-10-24 NOTE — Progress Notes (Signed)
Pt here today for repeat BP check from 1 week ago. Pt is PP. Pt delivered vaginally on 09/25/20 and was readmitted for Pre-E on 10/04/20.  Pt states only took 25 mg HCTZ for 1 day and stopped due to having headaches, then started back taking the 12.5mg  HCTZ tabs. Pt denies headaches with 12.5 mg. Pt denies all visual changes or swelling.   BP: 138/97, took another BP in ten mins- 137/98  Reviewed BP with Dr Macon Large. Advised pt to continue to take 12.5 mg HCTZ and return for PP visit on 10/30/20 for repeat BP check. Pt verbalized understanding and agreeable to plan of care.   Judeth Cornfield, RN

## 2020-10-30 ENCOUNTER — Ambulatory Visit (INDEPENDENT_AMBULATORY_CARE_PROVIDER_SITE_OTHER): Payer: No Typology Code available for payment source

## 2020-10-30 ENCOUNTER — Other Ambulatory Visit: Payer: Self-pay

## 2020-10-30 VITALS — BP 140/100 | HR 72 | Wt 176.0 lb

## 2020-10-30 DIAGNOSIS — I10 Essential (primary) hypertension: Secondary | ICD-10-CM

## 2020-10-30 MED ORDER — HYDROCHLOROTHIAZIDE 12.5 MG PO CAPS: 12.5000 mg | ORAL_CAPSULE | Freq: Two times a day (BID) | ORAL | 1 refills | Status: DC

## 2020-10-30 NOTE — Progress Notes (Signed)
Post Partum Visit Note  Brittney Khan is a 35 y.o. G24P1011 female who presents for a postpartum visit. She is 5 weeks postpartum following a normal spontaneous vaginal delivery.  I have fully reviewed the prenatal and intrapartum course. The delivery was at 39.3 gestational weeks.  Anesthesia: epidural. Postpartum course has been complicated by PP pre-eclampsia and chronic subdural hematomas. Baby is doing well. Baby is feeding by breast. Bleeding no bleeding. Bowel function is normal. Bladder function is normal. Patient is not sexually active. Contraception method is none. Postpartum depression screening: negative.  The pregnancy intention screening data noted above was reviewed. Potential methods of contraception were discussed. The patient elected to proceed with No data recorded.   Edinburgh Postnatal Depression Scale - 10/30/20 1559       Edinburgh Postnatal Depression Scale:  In the Past 7 Days   I have been able to laugh and see the funny side of things. 0    I have looked forward with enjoyment to things. 0    I have blamed myself unnecessarily when things went wrong. 0    I have been anxious or worried for no good reason. 0    I have felt scared or panicky for no good reason. 0    Things have been getting on top of me. 0    I have been so unhappy that I have had difficulty sleeping. 0    I have felt sad or miserable. 0    I have been so unhappy that I have been crying. 0    The thought of harming myself has occurred to me. 0    Edinburgh Postnatal Depression Scale Total 0             There are no preventive care reminders to display for this patient.  The following portions of the patient's history were reviewed and updated as appropriate: allergies, current medications, past family history, past medical history, past social history, past surgical history, and problem list.  Review of Systems Pertinent items are noted in HPI.  Objective:  BP (!) 140/100   Pulse  72   Wt 176 lb (79.8 kg)   LMP 10/30/2020 (Exact Date)   Breastfeeding Yes   BMI 29.29 kg/m    General:  alert and mild distress   Breasts:  normal  Lungs: clear to auscultation bilaterally  Heart:  regular rate and rhythm, S1, S2 normal, no murmur, click, rub or gallop  Abdomen: soft, non-tender; bowel sounds normal; no masses,  no organomegaly   Wound N/a  GU exam:  not indicated       Assessment:   1. Hypertension, unspecified type  2. Postpartum care and examination    Plan:   Essential components of care per ACOG recommendations:  1.  Mood and well being: Patient with negative depression screening today. Reviewed local resources for support.  - Patient tobacco use? No.   - hx of drug use? No.    2. Infant care and feeding:  -Patient currently breastmilk feeding? Yes. Discussed returning to work and pumping. Reviewed importance of draining breast regularly to support lactation.  -Social determinants of health (SDOH) reviewed in EPIC. No concerns  3. Sexuality, contraception and birth spacing - Patient does not want a pregnancy in the next year.  Desired family size is 1 children.  - Reviewed forms of contraception in tiered fashion. Patient desired no method today. Contraceptive options were discussed. Patient wants to wait until head CT results  before making decision. Not currently sexually active, but encouraged to use condoms if she does become active prior to visit  - Discussed birth spacing of 18 months  4. Sleep and fatigue -Encouraged family/partner/community support of 4 hrs of uninterrupted sleep to help with mood and fatigue  5. Physical Recovery  - Discussed patients delivery and complications. She describes her labor as good. - Patient had a Vaginal, no problems at delivery. Patient had a 2nd degree laceration. Perineal healing reviewed. Patient expressed understanding - Patient has urinary incontinence? No. - Patient is safe to resume physical and  sexual activity  6.  Health Maintenance - HM due items addressed Yes - Last pap smear  Diagnosis  Date Value Ref Range Status  03/11/2020   Final   - Negative for intraepithelial lesion or malignancy (NILM)   Pap smear not done at today's visit.  -Breast Cancer screening indicated? No.   7. Chronic Disease/Pregnancy Condition follow up: Hypertension  - Discussed with Dr. Jolayne Panther who recommends increasing HCTZ to 25mg /day and to follow up with PCP.  - Patient reports was previously on 25mg  daily, however it gave her headaches. Will try 12.5mg  BID instead of once daily - Patient has PCP appt scheduled in August and encouraged to keep appointment  - hydrochlorothiazide (MICROZIDE) 12.5 MG capsule; Take 1 capsule (12.5 mg total) by mouth 2 (two) times daily.  Dispense: 30 capsule; Refill: 1    , [facility], [facility]

## 2020-11-12 ENCOUNTER — Other Ambulatory Visit: Payer: Self-pay

## 2020-11-12 ENCOUNTER — Ambulatory Visit
Admission: RE | Admit: 2020-11-12 | Discharge: 2020-11-12 | Disposition: A | Discharge status: Home / Self Care | Payer: No Typology Code available for payment source | Source: Ambulatory Visit | Attending: Neurosurgery | Admitting: Neurosurgery

## 2020-11-12 DIAGNOSIS — S065X9A Traumatic subdural hemorrhage with loss of consciousness of unspecified duration, initial encounter: Secondary | ICD-10-CM

## 2020-11-12 DIAGNOSIS — S065XAA Traumatic subdural hemorrhage with loss of consciousness status unknown, initial encounter: Secondary | ICD-10-CM

## 2021-10-04 ENCOUNTER — Emergency Department (HOSPITAL_COMMUNITY): Payer: No Typology Code available for payment source

## 2021-10-04 ENCOUNTER — Other Ambulatory Visit: Payer: Self-pay

## 2021-10-04 ENCOUNTER — Encounter (HOSPITAL_COMMUNITY): Payer: Self-pay

## 2021-10-04 ENCOUNTER — Emergency Department (HOSPITAL_COMMUNITY)
Admission: EM | Admit: 2021-10-04 | Discharge: 2021-10-04 | Disposition: A | Discharge status: Home / Self Care | Payer: No Typology Code available for payment source | Attending: Emergency Medicine | Admitting: Emergency Medicine

## 2021-10-04 DIAGNOSIS — R519 Headache, unspecified: Secondary | ICD-10-CM

## 2021-10-04 DIAGNOSIS — R112 Nausea with vomiting, unspecified: Secondary | ICD-10-CM | POA: Insufficient documentation

## 2021-10-04 LAB — COMPREHENSIVE METABOLIC PANEL
ALT: 12 U/L (ref 0–44)
AST: 15 U/L (ref 15–41)
Albumin: 3.8 g/dL (ref 3.5–5.0)
Alkaline Phosphatase: 118 U/L (ref 38–126)
Anion gap: 8 (ref 5–15)
BUN: 5 mg/dL — ABNORMAL LOW (ref 6–20)
CO2: 24 mmol/L (ref 22–32)
Calcium: 8.9 mg/dL (ref 8.9–10.3)
Chloride: 106 mmol/L (ref 98–111)
Creatinine, Ser: 1 mg/dL (ref 0.44–1.00)
GFR, Estimated: >60 mL/min (ref >60)
Glucose, Bld: 89 mg/dL (ref 70–99)
Potassium: 3.8 mmol/L (ref 3.5–5.1)
Sodium: 138 mmol/L (ref 135–145)
Total Bilirubin: 0.6 mg/dL (ref 0.3–1.2)
Total Protein: 6.8 g/dL (ref 6.5–8.1)

## 2021-10-04 LAB — CBC WITH DIFFERENTIAL/PLATELET
Abs Immature Granulocytes: 0.02 10*3/uL (ref 0.00–0.07)
Basophils Absolute: 0 10*3/uL (ref 0.0–0.1)
Basophils Relative: 0 %
Eosinophils Absolute: 0 10*3/uL (ref 0.0–0.5)
Eosinophils Relative: 0 %
HCT: 39.9 % (ref 36.0–46.0)
Hemoglobin: 12.9 g/dL (ref 12.0–15.0)
Immature Granulocytes: 0 %
Lymphocytes Relative: 7 %
Lymphs Abs: 0.6 10*3/uL — ABNORMAL LOW (ref 0.7–4.0)
MCH: 29.5 pg (ref 26.0–34.0)
MCHC: 32.3 g/dL (ref 30.0–36.0)
MCV: 91.3 fL (ref 80.0–100.0)
Monocytes Absolute: 0.6 10*3/uL (ref 0.1–1.0)
Monocytes Relative: 7 %
Neutro Abs: 7.9 10*3/uL — ABNORMAL HIGH (ref 1.7–7.7)
Neutrophils Relative %: 86 %
Platelets: 284 10*3/uL (ref 150–400)
RBC: 4.37 MIL/uL (ref 3.87–5.11)
RDW: 13.2 % (ref 11.5–15.5)
WBC: 9.3 10*3/uL (ref 4.0–10.5)
nRBC: 0 % (ref 0.0–0.2)

## 2021-10-04 LAB — I-STAT BETA HCG BLOOD, ED (MC, WL, AP ONLY): I-stat hCG, quantitative: <5 m[IU]/mL (ref <5)

## 2021-10-04 MED ORDER — IOHEXOL 300 MG/ML  SOLN
75.0000 mL | Freq: Once | INTRAMUSCULAR | Status: AC | PRN
  Administered 2021-10-04: 75 mL via INTRAVENOUS

## 2021-10-04 MED ORDER — PROCHLORPERAZINE EDISYLATE 10 MG/2ML IJ SOLN
10.0000 mg | Freq: Once | INTRAMUSCULAR | Status: AC
  Administered 2021-10-04: 10 mg via INTRAVENOUS
  Filled 2021-10-04: qty 2

## 2021-10-04 MED ORDER — KETOROLAC TROMETHAMINE 15 MG/ML IJ SOLN
15.0000 mg | Freq: Once | INTRAMUSCULAR | Status: AC
  Administered 2021-10-04: 15 mg via INTRAVENOUS
  Filled 2021-10-04: qty 1

## 2021-10-04 MED ORDER — LORAZEPAM 2 MG/ML IJ SOLN
1.0000 mg | Freq: Once | INTRAMUSCULAR | Status: AC
  Administered 2021-10-04: 1 mg via INTRAVENOUS
  Filled 2021-10-04: qty 1

## 2021-10-04 MED ORDER — DEXAMETHASONE SODIUM PHOSPHATE 10 MG/ML IJ SOLN
10.0000 mg | Freq: Once | INTRAMUSCULAR | Status: AC
  Administered 2021-10-04: 10 mg via INTRAVENOUS
  Filled 2021-10-04: qty 1

## 2021-10-04 MED ORDER — SODIUM CHLORIDE 0.9 % IV BOLUS
1000.0000 mL | Freq: Once | INTRAVENOUS | Status: AC
  Administered 2021-10-04: 1000 mL via INTRAVENOUS

## 2021-10-04 NOTE — ED Provider Notes (Signed)
[facility] EMERGENCY DEPARTMENT Provider Note   CSN: [id number] Arrival date & time: 10/04/21  1148     History  No chief complaint on file.   Brittney Khan is a 36 y.o. female.  36 year old female presents with complaint of headache with nausea and vomiting.  Patient notes she has not had a headache like this since she had a subdural bleed with preeclampsia in pregnancy in June 2022.  Was managed by neurology at that time, treated with prednisone and eventually cleared.  States that she had some pain to the back of her neck for a few days last week, then developed minor cold symptoms, now with diffuse headache which is worse sitting upright, associated with nausea and vomiting as well as turning her head or moving her eyes.  Denies unilateral weakness or numbness, no changes in gait.  Denies possibility of pregnancy.       Home Medications Prior to Admission medications   Medication Sig Start Date End Date Taking? Authorizing Provider  acetaminophen (TYLENOL) 325 MG tablet Take 2 tablets (650 mg total) by mouth every 4 (four) hours. Patient not taking: Reported on 10/30/2020 09/26/20   Littie Deeds, MD  cyclobenzaprine (FLEXERIL) 10 MG tablet Take 1 tablet (10 mg total) by mouth every 8 (eight) hours as needed for muscle spasms. Patient not taking: No sig reported 09/27/20   Littie Deeds, MD  Ferrous Gluconate 324 (37.5 Fe) MG TABS Take 1 tablet (324 mg total) by mouth every other day. 07/21/20   Marylene Land, CNM  hydrochlorothiazide (MICROZIDE) 12.5 MG capsule Take 1 capsule (12.5 mg total) by mouth 2 (two) times daily. 10/30/20   Brand Males, CNM  ibuprofen (ADVIL) 600 MG tablet Take 1 tablet (600 mg total) by mouth every 6 (six) hours. Patient not taking: Reported on 10/30/2020 09/26/20   Littie Deeds, MD  Prenatal Vit-Fe Fumarate-FA (PRENATAL MULTIVITAMIN) TABS tablet Take 1 tablet by mouth daily at 12 noon.    [provider]   amLODipine (NORVASC) 5 MG tablet Take 1 tablet (5 mg total) by mouth daily. Patient not taking: Reported on 10/02/2020 09/27/20 10/04/20  Littie Deeds, MD      Allergies    Reglan [metoclopramide]    Review of Systems   Review of Systems Negative except as per HPI Physical Exam Updated Vital Signs BP (!) 142/97   Pulse 68   Temp 99 F (37.2 C) (Oral)   Resp 18   SpO2 100%  Physical Exam Vitals and nursing note reviewed.  Constitutional:      General: She is not in acute distress.    Appearance: She is well-developed. She is not diaphoretic.     Comments: Appears uncomfortable, exam is complicated as patient is only able to lay on the stretcher in position of comfort which currently is prone  HENT:     Head: Normocephalic and atraumatic.     Nose: Nose normal.     Mouth/Throat:     Mouth: Mucous membranes are moist.  Eyes:     Extraocular Movements: Extraocular movements intact.     Pupils: Pupils are equal, round, and reactive to light.  Cardiovascular:     Rate and Rhythm: Normal rate and regular rhythm.     Heart sounds: Normal heart sounds.  Pulmonary:     Effort: Pulmonary effort is normal.     Breath sounds: Normal breath sounds.  Musculoskeletal:     Cervical back: Neck supple. No rigidity.  Right lower leg: No edema.     Left lower leg: No edema.  Skin:    General: Skin is warm and dry.     Findings: No erythema or rash.  Neurological:     Mental Status: She is alert and oriented to person, place, and time.     Cranial Nerves: No cranial nerve deficit.     Sensory: No sensory deficit.     Motor: No weakness.  Psychiatric:        Behavior: Behavior normal.     ED Results / Procedures / Treatments   Labs (all labs ordered are listed, but only abnormal results are displayed) Labs Reviewed  COMPREHENSIVE METABOLIC PANEL - Abnormal; Notable for the following components:      Result Value   BUN 5 (*)    All other components within normal limits  CBC  WITH DIFFERENTIAL/PLATELET - Abnormal; Notable for the following components:   Neutro Abs 7.9 (*)    Lymphs Abs 0.6 (*)    All other components within normal limits  I-STAT BETA HCG BLOOD, ED ([facility], [facility], [facility] ONLY)    EKG None  Radiology CT VENOGRAM HEAD  Result Date: 10/04/2021 CLINICAL DATA:  Headaches for 3 days. Personal history bilateral subdural hematomas. EXAM: CT VENOGRAM HEAD TECHNIQUE: Venographic phase images of the brain were obtained following the administration of intravenous contrast. Multiplanar reformats and maximum intensity projections were generated. RADIATION DOSE REDUCTION: This exam was performed according to the departmental dose-optimization program which includes automated exposure control, adjustment of the mA and/or kV according to patient size and/or use of iterative reconstruction technique. CONTRAST:  22mL OMNIPAQUE IOHEXOL 300 MG/ML  SOLN COMPARISON:  CT venogram 10/05/2020 FINDINGS: Noncontrast images demonstrate no acute infarct, hemorrhage, or mass lesion. No significant white matter lesions are present. No significant extra-axial collection is present. The ventricles are of normal size. The brainstem and cerebellum are within normal limits. Postcontrast images demonstrate normal opacification of the dural sinuses. The transverse sinuses are codominant. No focal filling defect or significant stenosis is present. The straight sinus and deep cerebral veins are intact. Arterial structures are unremarkable. The paranasal sinuses and mastoid air cells are clear. The globes and orbits are within normal limits. Calvarium is intact. No focal lytic or blastic lesions are present. No significant extracranial soft tissue lesion is present. IMPRESSION: 1. Negative CT venogram of the head. 2. Negative CT of the head without and with contrast. No residual recurrent extra-axial collections. Electronically Signed   By: Marin Roberts M.D.   On: 10/04/2021 14:55     Procedures Procedures    Medications Ordered in ED Medications  sodium chloride 0.9 % bolus 1,000 mL (0 mLs Intravenous Stopped 10/04/21 1510)  LORazepam (ATIVAN) injection 1 mg (1 mg Intravenous Given 10/04/21 1321)  iohexol (OMNIPAQUE) 300 MG/ML solution 75 mL (75 mLs Intravenous Contrast Given 10/04/21 1447)  ketorolac (TORADOL) 15 MG/ML injection 15 mg (15 mg Intravenous Given 10/04/21 1523)  dexamethasone (DECADRON) injection 10 mg (10 mg Intravenous Given 10/04/21 1526)  prochlorperazine (COMPAZINE) injection 10 mg (10 mg Intravenous Given 10/04/21 1524)    ED Course/ Medical Decision Making/ [name]                           Medical Decision Making Amount and/or Complexity of Data Reviewed Labs: ordered. Radiology: ordered.  Risk Prescription drug management.   This patient presents to the ED for concern of headache, generalized weakness, this  involves an extensive number of treatment options, and is a complaint that carries with it a high risk of complications and morbidity.  The differential diagnosis includes venous thrombosis, migraine, dehydration   Co morbidities that complicate the patient evaluation  History of subdural bleed 1 year ago   Additional history obtained:  External records from outside source obtained and reviewed including discharge summary from admission October 04, 2020 to October 06, 2020, admitted for postpartum severe preeclampsia, complicated by chronic subdural hematoma, managed with steroids and followed by neurology, eventually cleared by neurology.   Lab Tests:  I Ordered, and personally interpreted labs.  The pertinent results include: hCG negative, CBC and CMP without significant findings.   Imaging Studies ordered:  I ordered imaging studies including CT venogram I independently visualized and interpreted imaging which showed unremarkable I agree with the radiologist interpretation  Consultations Obtained:  I requested consultation with the Dr.  Jeraldine Loots, ER attending,  and discussed lab and imaging findings as well as pertinent plan - they recommend: CT venogram.  Reviewed and unremarkable.  Plan is to treat with migraine cocktail, reassess.   Problem List / ED Course / Critical interventions / Medication management  36 year old female with history of prior subdural bleed presents with complaint of headache.  On exam, is uncomfortable appearing lying prone in the bed.  Exam was complicated due to patient's level of discomfort and positioning although no acute findings identified.  Patient was given Ativan, IV fluids, labs assessed and CT venogram completed.  Work-up is reassuring.  Patient is now sitting in the bed with her head resting on the rail, notes pain is worse if she tries to sit away up.  Plan is to give additional medication for migraine cocktail including Decadron, Compazine, Toradol, ambulate and reassess. Care signed out at change of shift to oncoming provider pending reevaluation after medication administration and ambulation. Reevaluation of the patient after these medicines showed that the patient improved I have reviewed the patients home medicines and have made adjustments as needed   Social Determinants of Health:  Has PCP for follow-up   Test / Admission - Considered:  Final disposition pending reassessment after meds and ambulation         Final Clinical Impression(s) / ED Diagnoses Final diagnoses:  Acute nonintractable headache, unspecified headache type    Rx / DC Orders ED Discharge Orders     None         Alden Hipp 10/04/21 1546    Gerhard Munch, MD 10/05/21 2244

## 2021-10-04 NOTE — ED Triage Notes (Signed)
Patient complains of migraine headache x 3 days. Nausea/vomiting/photophobia. Reports hx of bleed in past. Alert and oriented

## 2021-10-04 NOTE — ED Notes (Signed)
Pt ambulated to bathroom 

## 2021-10-04 NOTE — Discharge Instructions (Signed)
Please return immediately to the ED for any new concerns or symptoms Please follow-up with your PCP for further management Please read attached informational guide concerning migraines

## 2021-10-04 NOTE — ED Provider Notes (Signed)
  Physical Exam  BP (!) 142/97   Pulse 68   Temp 99 F (37.2 C) (Oral)   Resp 18   SpO2 100%   Physical Exam Vitals and nursing note reviewed.  Constitutional:      General: She is not in acute distress.    Appearance: She is not toxic-appearing.  HENT:     Head: Normocephalic and atraumatic.     Nose: Nose normal. No congestion.     Mouth/Throat:     Mouth: Mucous membranes are moist.     Pharynx: Oropharynx is clear.  Eyes:     Extraocular Movements: Extraocular movements intact.     Conjunctiva/sclera: Conjunctivae normal.     Pupils: Pupils are equal, round, and reactive to light.  Cardiovascular:     Rate and Rhythm: Normal rate and regular rhythm.  Pulmonary:     Effort: No respiratory distress.  Abdominal:     General: Abdomen is flat. Bowel sounds are normal.     Palpations: Abdomen is soft.     Tenderness: There is no abdominal tenderness.  Musculoskeletal:     Cervical back: Normal range of motion and neck supple. No tenderness.  Skin:    General: Skin is warm and dry.     Capillary Refill: Capillary refill takes less than 2 seconds.     Coloration: Skin is not jaundiced or pale.  Neurological:     Mental Status: She is alert and oriented to person, place, and time.     GCS: GCS eye subscore is 4. GCS verbal subscore is 5. GCS motor subscore is 6.     Cranial Nerves: Cranial nerves 2-12 are intact. No cranial nerve deficit.     Sensory: Sensation is intact. No sensory deficit.     Motor: Motor function is intact. No weakness.     Coordination: Coordination is intact. Heel to [facility] Test normal.  Psychiatric:        Behavior: Behavior normal.     Procedures  Procedures  ED Course / MDM    Medical Decision Making Amount and/or Complexity of Data Reviewed Labs: ordered. Radiology: ordered.  Risk Prescription drug management.   Patient signed out to me at shift change.  Please see previous provider note for further detail.  In short, this is a  36 year old female who presents with complaint of headache and nausea and vomiting.  Patient states she has not had a headache like this since she had a subdural bleed with preeclampsia in pregnancy in June 2022.  Patient managed by neurology at that time, treated with prednisone and eventually cleared.  Patient signed out to me pending reassessment after Compazine, Toradol and Decadron were administered for migraine.  After these medications were given, the patient states that she feels much better.  The patient neurological examination shows no focal neurodeficits.  The patient clinically looks better.  At this time, the patient be discharged home and advised to follow-up with her PCP for any further needs.  The patient was given return precautions and she voiced understanding of these.  The patient had all of her questions answered to her satisfaction.  The patient is stable at this time for discharge home.       Al Decant, PA-C 10/04/21 1632    Vanetta Mulders, MD 10/04/21 [phone]

## 2022-02-13 ENCOUNTER — Emergency Department (HOSPITAL_COMMUNITY): Payer: No Typology Code available for payment source

## 2022-02-13 ENCOUNTER — Other Ambulatory Visit: Payer: Self-pay

## 2022-02-13 ENCOUNTER — Emergency Department (HOSPITAL_COMMUNITY)
Admission: EM | Admit: 2022-02-13 | Discharge: 2022-02-14 | Disposition: A | Discharge status: Home / Self Care | Payer: No Typology Code available for payment source | Attending: Emergency Medicine | Admitting: Emergency Medicine

## 2022-02-13 ENCOUNTER — Encounter (HOSPITAL_COMMUNITY): Payer: Self-pay

## 2022-02-13 DIAGNOSIS — N760 Acute vaginitis: Secondary | ICD-10-CM | POA: Insufficient documentation

## 2022-02-13 DIAGNOSIS — R519 Headache, unspecified: Secondary | ICD-10-CM | POA: Diagnosis not present

## 2022-02-13 DIAGNOSIS — R102 Pelvic and perineal pain: Secondary | ICD-10-CM

## 2022-02-13 DIAGNOSIS — E876 Hypokalemia: Secondary | ICD-10-CM | POA: Diagnosis not present

## 2022-02-13 DIAGNOSIS — B9689 Other specified bacterial agents as the cause of diseases classified elsewhere: Secondary | ICD-10-CM | POA: Diagnosis not present

## 2022-02-13 DIAGNOSIS — R7309 Other abnormal glucose: Secondary | ICD-10-CM | POA: Insufficient documentation

## 2022-02-13 DIAGNOSIS — N939 Abnormal uterine and vaginal bleeding, unspecified: Secondary | ICD-10-CM | POA: Diagnosis present

## 2022-02-13 LAB — CBC WITH DIFFERENTIAL/PLATELET
Abs Immature Granulocytes: 0.03 10*3/uL (ref 0.00–0.07)
Basophils Absolute: 0 10*3/uL (ref 0.0–0.1)
Basophils Relative: 0 %
Eosinophils Absolute: 0.1 10*3/uL (ref 0.0–0.5)
Eosinophils Relative: 1 %
HCT: 40.5 % (ref 36.0–46.0)
Hemoglobin: 13.5 g/dL (ref 12.0–15.0)
Immature Granulocytes: 0 %
Lymphocytes Relative: 17 %
Lymphs Abs: 1.5 10*3/uL (ref 0.7–4.0)
MCH: 31 pg (ref 26.0–34.0)
MCHC: 33.3 g/dL (ref 30.0–36.0)
MCV: 92.9 fL (ref 80.0–100.0)
Monocytes Absolute: 0.6 10*3/uL (ref 0.1–1.0)
Monocytes Relative: 7 %
Neutro Abs: 6.8 10*3/uL (ref 1.7–7.7)
Neutrophils Relative %: 75 %
Platelets: 288 10*3/uL (ref 150–400)
RBC: 4.36 MIL/uL (ref 3.87–5.11)
RDW: 12.6 % (ref 11.5–15.5)
WBC: 9 10*3/uL (ref 4.0–10.5)
nRBC: 0 % (ref 0.0–0.2)

## 2022-02-13 LAB — BASIC METABOLIC PANEL
Anion gap: 9 (ref 5–15)
BUN: 8 mg/dL (ref 6–20)
CO2: 28 mmol/L (ref 22–32)
Calcium: 9.5 mg/dL (ref 8.9–10.3)
Chloride: 104 mmol/L (ref 98–111)
Creatinine, Ser: 0.96 mg/dL (ref 0.44–1.00)
GFR, Estimated: >60 mL/min (ref >60)
Glucose, Bld: 118 mg/dL — ABNORMAL HIGH (ref 70–99)
Potassium: 3.2 mmol/L — ABNORMAL LOW (ref 3.5–5.1)
Sodium: 141 mmol/L (ref 135–145)

## 2022-02-13 LAB — I-STAT BETA HCG BLOOD, ED (MC, WL, AP ONLY): I-stat hCG, quantitative: <5 m[IU]/mL (ref <5)

## 2022-02-13 NOTE — ED Provider Triage Note (Signed)
Emergency Medicine Provider Triage Evaluation Note  Brittney Khan , a 36 y.o. female  was evaluated in triage.  Pt complains of headache.  Has had a right-sided headache over the last 3 days.  No vision changes, weakness.  She has history of subdural bleed and is concerned about this.  She is also concerned that she has had pain to her IUD over the last 3 months.  When she gets her menstrual cycle is heavier than normal.  She has no current bleeding.  Review of Systems  Positive: Headache, IUD pain Negative:   Physical Exam  BP (!) 159/94 (BP Location: Right Arm)   Pulse 69   Temp 98 F (36.7 C) (Oral)   Resp 17   SpO2 100%  Gen:   Awake, no distress   Resp:  Normal effort  MSK:   Moves extremities without difficulty  Neuro:  CN 2-12 grossly intact, ambulatory Other:    Medical Decision Making  Medically screening exam initiated at 4:07 PM.  Appropriate orders placed.  Brittney Khan was informed that the remainder of the evaluation will be completed by another provider, this initial triage assessment does not replace that evaluation, and the importance of remaining in the ED until their evaluation is complete.  Headache, IUD pain   [name] A, PA-C 02/13/22 1608

## 2022-02-13 NOTE — ED Triage Notes (Signed)
Pt reports headache x 3 days, no vision changes. Hx of subdural hematomas when she was pregnant last year. She also reports vaginal bleeding today, had gone through 2 pads today. LMP- last week. She has an IUD, pain with walking.

## 2022-02-14 ENCOUNTER — Emergency Department (HOSPITAL_COMMUNITY): Payer: No Typology Code available for payment source

## 2022-02-14 LAB — WET PREP, GENITAL
Sperm: NONE SEEN
Trich, Wet Prep: NONE SEEN
WBC, Wet Prep HPF POC: >=10 — AB (ref <10)
Yeast Wet Prep HPF POC: NONE SEEN

## 2022-02-14 MED ORDER — METRONIDAZOLE 500 MG PO TABS: 500.0000 mg | ORAL_TABLET | Freq: Two times a day (BID) | ORAL | 0 refills | Status: DC

## 2022-02-14 NOTE — ED Provider Notes (Signed)
[facility] EMERGENCY DEPARTMENT Provider Note   CSN: [id number] Arrival date & time: 02/13/22  1508     History  Chief Complaint  Patient presents with   Headache   Vaginal Bleeding    Brittney Khan is a 36 y.o. female.  Patient with history of subdural hematoma presents to the emergency department today for evaluation of headache and vaginal bleeding.  Patient states that she had an IUD implanted about 3 months ago.  Over the past 3 to 4 days she has had lower abdominal cramping with radiation to her back.  She had vaginal bleeding which resolved yesterday.  She also complains of a discharge.  No fevers or vomiting.  Headache was generalized and was worrisome because of her previous history.  Recent bleeding was unexpected as she had her previous cycle this month.  Patient denies signs of stroke including: facial droop, slurred speech, aphasia, weakness/numbness in extremities, imbalance/trouble walking.        Home Medications Prior to Admission medications   Medication Sig Start Date End Date Taking? Authorizing Provider  acetaminophen (TYLENOL) 325 MG tablet Take 2 tablets (650 mg total) by mouth every 4 (four) hours. Patient not taking: Reported on 10/30/2020 09/26/20   Littie Deeds, MD  cyclobenzaprine (FLEXERIL) 10 MG tablet Take 1 tablet (10 mg total) by mouth every 8 (eight) hours as needed for muscle spasms. Patient not taking: No sig reported 09/27/20   Littie Deeds, MD  Ferrous Gluconate 324 (37.5 Fe) MG TABS Take 1 tablet (324 mg total) by mouth every other day. 07/21/20   Marylene Land, CNM  hydrochlorothiazide (MICROZIDE) 12.5 MG capsule Take 1 capsule (12.5 mg total) by mouth 2 (two) times daily. 10/30/20   Brand Males, CNM  ibuprofen (ADVIL) 600 MG tablet Take 1 tablet (600 mg total) by mouth every 6 (six) hours. Patient not taking: Reported on 10/30/2020 09/26/20   Littie Deeds, MD  Prenatal Vit-Fe Fumarate-FA (PRENATAL  MULTIVITAMIN) TABS tablet Take 1 tablet by mouth daily at 12 noon.    [provider]  amLODipine (NORVASC) 5 MG tablet Take 1 tablet (5 mg total) by mouth daily. Patient not taking: Reported on 10/02/2020 09/27/20 10/04/20  Littie Deeds, MD      Allergies    Reglan [metoclopramide]    Review of Systems   Review of Systems  Physical Exam Updated Vital Signs BP 128/82   Pulse 65   Temp 98.8 F (37.1 C)   Resp 14   Ht 5\' 5"  (1.651 m)   Wt 81.6 kg   LMP 02/06/2022 (Approximate)   SpO2 100%   Breastfeeding No   BMI 29.95 kg/m  Physical Exam Vitals and nursing note reviewed. Exam conducted with a chaperone present.  Constitutional:      General: She is not in acute distress.    Appearance: She is well-developed.  HENT:     Head: Normocephalic and atraumatic.     Right Ear: External ear normal.     Left Ear: External ear normal.     Nose: Nose normal.  Eyes:     Conjunctiva/sclera: Conjunctivae normal.  Cardiovascular:     Rate and Rhythm: Normal rate and regular rhythm.     Heart sounds: No murmur heard. Pulmonary:     Effort: No respiratory distress.     Breath sounds: No wheezing, rhonchi or rales.  Abdominal:     Palpations: Abdomen is soft.     Tenderness: There is abdominal tenderness. There  is no guarding or rebound.     Comments: Mild lower abdominal tenderness  Genitourinary:    Exam position: Lithotomy position.     Labia:        Right: No rash.        Left: No rash.      Vagina: Vaginal discharge (yellow/green, mucus) present.     Cervix: Discharge present. No cervical motion tenderness, erythema or cervical bleeding.     Uterus: Not tender.      Adnexa:        Right: Tenderness (mild) present. No fullness.         Left: No tenderness or fullness.    Musculoskeletal:     Cervical back: Normal range of motion and neck supple.     Right lower leg: No edema.     Left lower leg: No edema.  Skin:    General: Skin is warm and dry.     Findings: No  rash.  Neurological:     General: No focal deficit present.     Mental Status: She is alert. Mental status is at baseline.     Motor: No weakness.  Psychiatric:        Mood and Affect: Mood normal.     ED Results / Procedures / Treatments   Labs (all labs ordered are listed, but only abnormal results are displayed) Labs Reviewed  WET PREP, GENITAL - Abnormal; Notable for the following components:      Result Value   Clue Cells Wet Prep HPF POC PRESENT (*)    WBC, Wet Prep HPF POC >=10 (*)    All other components within normal limits  BASIC METABOLIC PANEL - Abnormal; Notable for the following components:   Potassium 3.2 (*)    Glucose, Bld 118 (*)    All other components within normal limits  CBC WITH DIFFERENTIAL/PLATELET  URINALYSIS, ROUTINE W REFLEX MICROSCOPIC  I-STAT BETA HCG BLOOD, ED ([facility], [facility], [facility] ONLY)  GC/CHLAMYDIA PROBE AMP ([place]) NOT AT [facility]    EKG None  Radiology US PELVIC COMPLETE W TRANSVAGINAL AND TORSION R/O  Result Date: 02/14/2022 CLINICAL DATA:  Pelvic pain.  History of an IUD. EXAM: TRANSABDOMINAL AND TRANSVAGINAL ULTRASOUND OF PELVIS DOPPLER ULTRASOUND OF OVARIES TECHNIQUE: Both transabdominal and transvaginal ultrasound examinations of the pelvis were performed. Transabdominal technique was performed for global imaging of the pelvis including uterus, ovaries, adnexal regions, and pelvic cul-de-sac. It was necessary to proceed with endovaginal exam following the transabdominal exam to visualize the uterus, endometrium and ovaries to better advantage. Color and duplex Doppler ultrasound was utilized to evaluate blood flow to the ovaries. COMPARISON:  None Available. FINDINGS: Uterus Measurements: 7.7 x 3.9 x 4.8 cm = volume: 76.0 mL. No fibroids or other mass visualized. Endometrium Thickness: 5 mm. Linear echogenicity from the IUD projects in the lower uterine segment extending to the cervix, but not in the mid to upper uterine segment. No endometrial  mass/focal lesion. No endometrial fluid. Right ovary Measurements: 3.0 x 1.8 x 3.1 cm = volume: 8.8 mL. Normal appearance/no adnexal mass. Left ovary Measurements: 2.5 x 1.9 x 2.2 cm = volume: 5.2 mL. Normal appearance/no adnexal mass. Pulsed Doppler evaluation of both ovaries demonstrates normal low-resistance arterial and venous waveforms. Other findings No abnormal free fluid. IMPRESSION: 1. No acute findings.  No findings to account pelvic pain. 2. IUD appears mildly displaced inferiorly, visualized within the lower uterine segment extending to the cervix. No other abnormality. Electronically Signed  By: Amie Portland M.D.   On: 02/14/2022 11:15   CT HEAD WO CONTRAST ( )  Result Date: 02/13/2022 CLINICAL DATA:  Headache for 3 days EXAM: CT HEAD WITHOUT CONTRAST TECHNIQUE: Contiguous axial images were obtained from the base of the skull through the vertex without intravenous contrast. RADIATION DOSE REDUCTION: This exam was performed according to the departmental dose-optimization program which includes automated exposure control, adjustment of the mA and/or kV according to patient size and/or use of iterative reconstruction technique. COMPARISON:  10/04/2021 FINDINGS: Brain: No acute infarct or hemorrhage. Lateral ventricles and midline structures are stable. No acute extra-axial fluid collections. No mass effect. Vascular: No hyperdense vessel or unexpected calcification. Skull: Normal. Negative for fracture or focal lesion. Sinuses/Orbits: No acute finding. Other: None. IMPRESSION: 1. Stable head CT, no acute intracranial process. Electronically Signed   By: Sharlet Salina M.D.   On: 02/13/2022 17:42    Procedures Procedures    Medications Ordered in ED Medications - No data to display  ED Course/ Medical Decision Making/ [name]    Patient seen and examined. History obtained directly from patient. Work-up including labs, imaging, EKG ordered in triage, if performed, were reviewed.     Labs/EKG: Independently reviewed and interpreted.  This included: CBC unremarkable with normal hemoglobin and white blood cell count; BMP with mild hypokalemia at 3.2, glucose 118 otherwise unremarkable; pregnancy negative.  Imaging: Independently visualized and interpreted.  This included: CT of the brain without contrast, agree no bleeding.  Medications/Fluids: Ordered: None ordered.  I offered medication for headache, patient declines at the current time.  Most recent vital signs reviewed and are as follows: BP 128/82   Pulse 65   Temp 98.8 F (37.1 C)   Resp 14   Ht 5\' 5"  (1.651 m)   Wt 81.6 kg   LMP 02/06/2022 (Approximate)   SpO2 100%   Breastfeeding No   BMI 29.95 kg/m   Initial impression: Lower abdominal pain, recent vaginal bleeding, headache.  Patient is concerned that she is having a complication from her IUD.  Will perform pelvic exam and send wet prep, will obtain ultrasound as well.  10:11 AM Pelvic exam performed with chaperone.   12:19 PM Reassessment performed. Patient appears stable.  Labs personally reviewed and interpreted including: Wet prep  Imaging personally visualized and interpreted including: Ultrasound, agree with findings per radiologist  Reviewed pertinent lab work and imaging with patient at bedside. Questions answered.   Most current vital signs reviewed and are as follows: BP (!) 141/99   Pulse 69   Temp 98.8 F (37.1 C)   Resp 20   Ht 5\' 5"  (1.651 m)   Wt 81.6 kg   LMP 02/06/2022 (Approximate)   SpO2 100%   Breastfeeding No   BMI 29.95 kg/m   Plan: Discharge to home.   Prescriptions written for: Metronidazole, patient would like to try something for vaginal discharge and clue cells on wet prep  Other home care instructions discussed: OTC meds for pain and discomfort  ED return instructions discussed: Return with worsening pain, heavy persistent bleeding  Follow-up instructions discussed: Patient encouraged to follow-up with  their OB/GYN as planned                           Medical Decision Making Amount and/or Complexity of Data Reviewed Labs: ordered. Radiology: ordered.  Risk Prescription drug management.   Headache: In regards to the patient's headache, critical differentials were considered  including subarachnoid hemorrhage, intracerebral hemorrhage, epidural/subdural hematoma, pituitary apoplexy, vertebral/carotid artery dissection, giant cell arteritis, central venous thrombosis, reversible cerebral vasoconstriction, acute angle closure glaucoma, idiopathic intracranial hypertension, bacterial meningitis, viral encephalitis, carbon monoxide poisoning, posterior reversible encephalopathy syndrome, pre-eclampsia.   Reg flag symptoms related to these causes were considered including systemic symptoms (fever, weight loss), neurologic symptoms (confusion, mental status change, vision change, associated seizure), acute or sudden "thunderclap" onset, patient age 34 or older with new or progressive headache, patient of any age with first headache or change in headache pattern, pregnant or postpartum status, history of HIV or other immunocompromise, history of cancer, headache occurring with exertion, associated neck or shoulder pain, associated traumatic injury, concurrent use of anticoagulation, family history of spontaneous SAH, and concurrent drug use.    Other benign, more common causes of headache were considered including migraine, tension-type headache, cluster headache, referred pain from other cause such as sinus infection, dental pain, trigeminal neuralgia.   On exam, patient has a reassuring neuro exam including baseline mental status, no significant neck pain or meningeal signs, no signs of severe infection or fever.   For this patient's complaint of abdominal/pelvic pain, the following conditions were considered on the differential diagnosis: gastritis/PUD, enteritis/duodenitis, appendicitis,  cholelithiasis/cholecystitis, cholangitis, pancreatitis, ruptured viscus, colitis, diverticulitis, small/large bowel obstruction, proctitis, cystitis, pyelonephritis, ureteral colic, aortic dissection, aortic aneurysm. In women, ectopic pregnancy, pelvic inflammatory disease, ovarian cysts, and tubo-ovarian abscess were also considered. Atypical chest etiologies were also considered including ACS, PE, and pneumonia.   No emergent IUD complications such as infection or uterine perforation suspected at this time.  The patient's vital signs, pertinent lab work and imaging were reviewed and interpreted as discussed in the ED course. Hospitalization was considered for further testing, treatments, or serial exams/observation. However as patient is well-appearing, has a stable exam, and reassuring studies today, I do not feel that they warrant admission at this time. This plan was discussed with the patient who verbalizes agreement and comfort with this plan and seems reliable and able to return to the Emergency Department with worsening or changing symptoms.          Final Clinical Impression(s) / ED Diagnoses Final diagnoses:  Pelvic pain  Bacterial vaginosis    Rx / DC Orders ED Discharge Orders          Ordered    metroNIDAZOLE (FLAGYL) 500 MG tablet  2 times daily        02/14/22 1216              Renne CriglerGeiple, Onie Kasparek, PA-C 02/14/22 1221    Tegeler, Canary Brimhristopher J, MD 02/14/22 1559

## 2022-02-14 NOTE — Discharge Instructions (Addendum)
Please read and follow all provided instructions.  Your diagnoses today include:  1. Pelvic pain   2. Bacterial vaginosis     Tests performed today include: Complete blood cell count: Normal infection fighting cell count Basic metabolic panel: Slightly low potassium otherwise no problems Pregnancy test (urine or blood, in women only): Negative Ultrasound: IUD appears slightly lower in the uterus than expected but no emergencies Wet prep: possible bacterial vaginosis Vital signs. See below for your results today.   Medications prescribed:  Metronidazole - antibiotic  You have been prescribed an antibiotic medicine: take the entire course of medicine even if you are feeling better. Stopping early can cause the antibiotic not to work. Do not drink alcohol when taking this medication.   Take any prescribed medications only as directed.  Home care instructions:  Follow any educational materials contained in this packet.  BE VERY CAREFUL not to take multiple medicines containing Tylenol (also called acetaminophen). Doing so can lead to an overdose which can damage your liver and cause liver failure and possibly death.   Follow-up instructions: Please follow-up with your primary care provider in the next 3 days for further evaluation of your symptoms.   Return instructions:  Please return to the Emergency Department if you experience worsening symptoms.  Please return if you have any other emergent concerns.  Additional Information:  Your vital signs today were: BP (!) 141/99   Pulse 69   Temp 98.8 F (37.1 C)   Resp 20   Ht 5\' 5"  (1.651 m)   Wt 81.6 kg   LMP 02/06/2022 (Approximate)   SpO2 100%   Breastfeeding No   BMI 29.95 kg/m  If your blood pressure (BP) was elevated above 135/85 this visit, please have this repeated by your doctor within one month. --------------

## 2022-02-15 LAB — GC/CHLAMYDIA PROBE AMP (~~LOC~~) NOT AT ARMC
Chlamydia: POSITIVE — AB
Comment: NEGATIVE
Comment: NORMAL
Neisseria Gonorrhea: NEGATIVE

## 2022-02-19 ENCOUNTER — Telehealth (HOSPITAL_COMMUNITY): Payer: Self-pay

## 2022-05-22 IMAGING — US US MFM OB DETAIL+14 WK
1 series · 13 of 28 positions shown · non-contrast
Comparison: none

[Series 1: us mfm ob detail+14 wk · 13 of 102 slices shown]
[im 4/102]
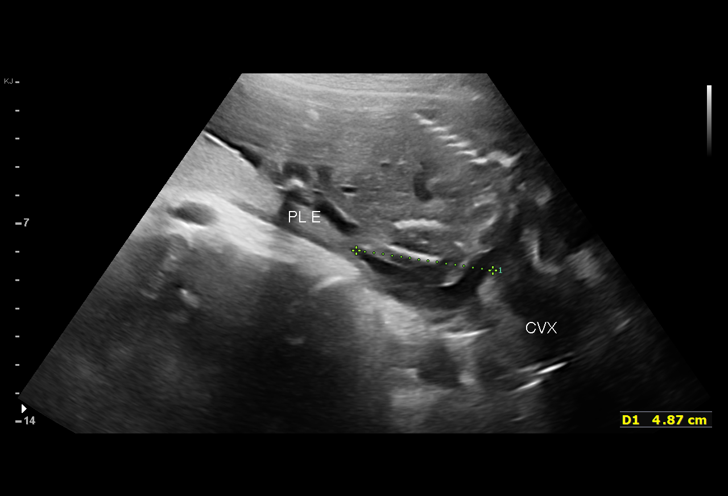
[im 12/102]
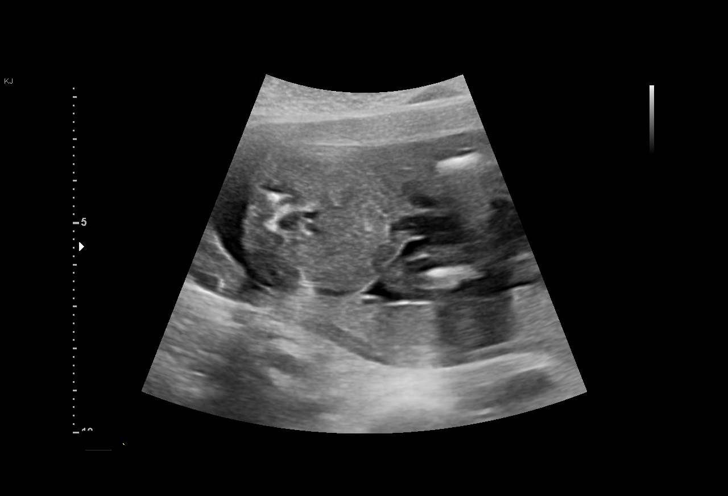
[im 19/102]
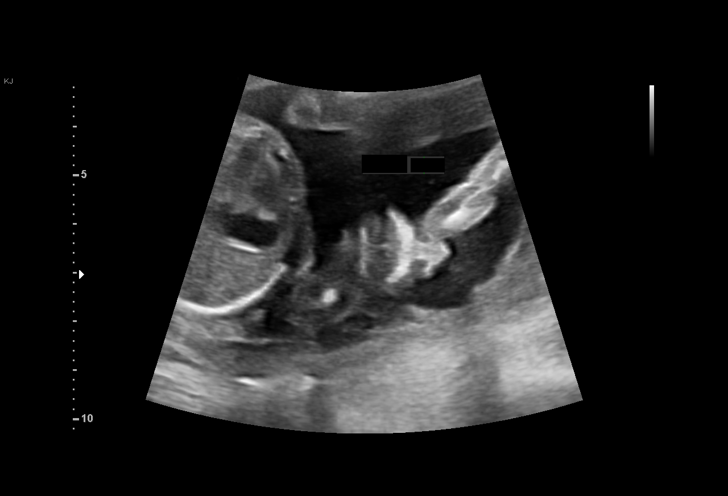
[im 27/102]
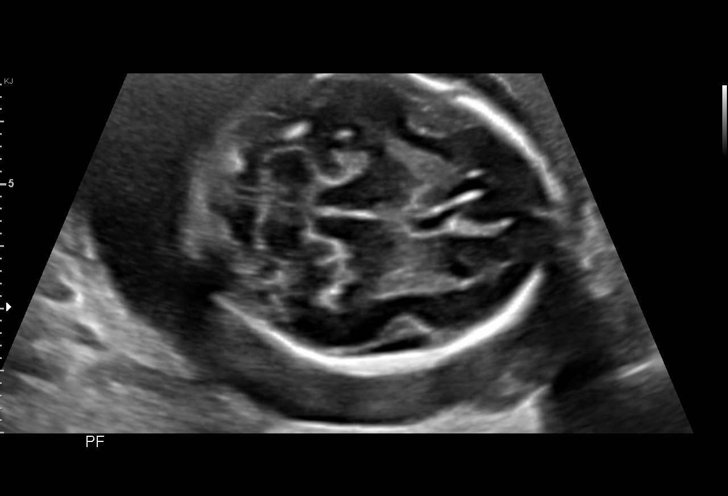
[im 34/102]
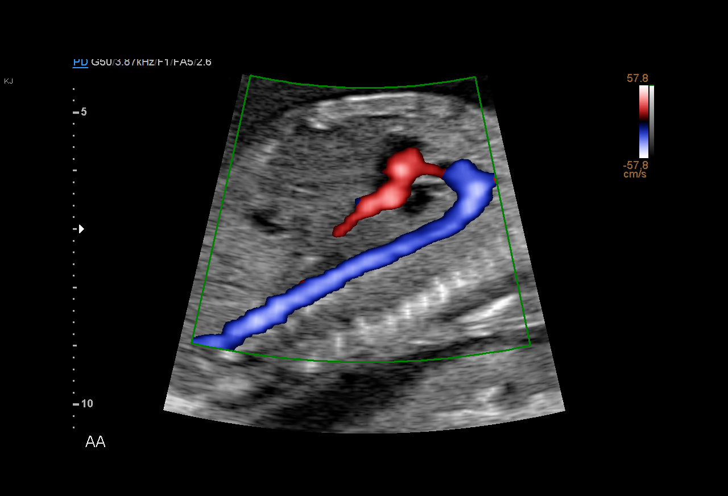
[im 42/102]
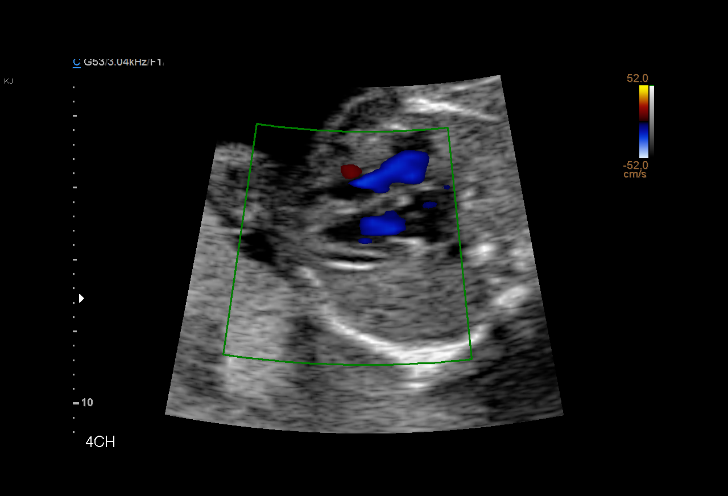
[im 53/102]
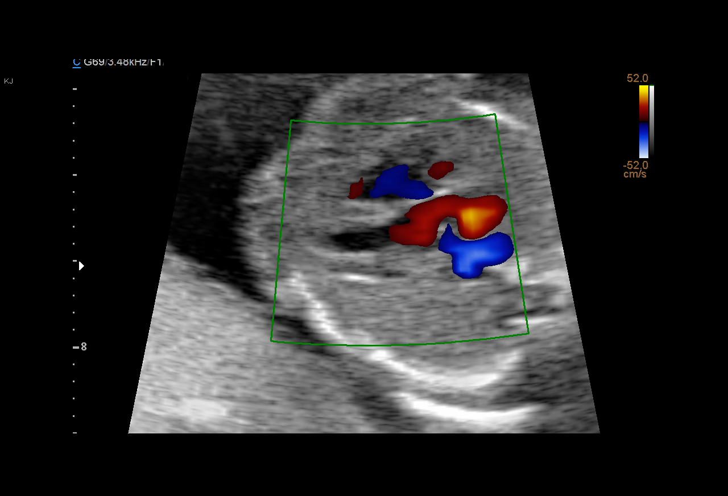
[im 60/102]
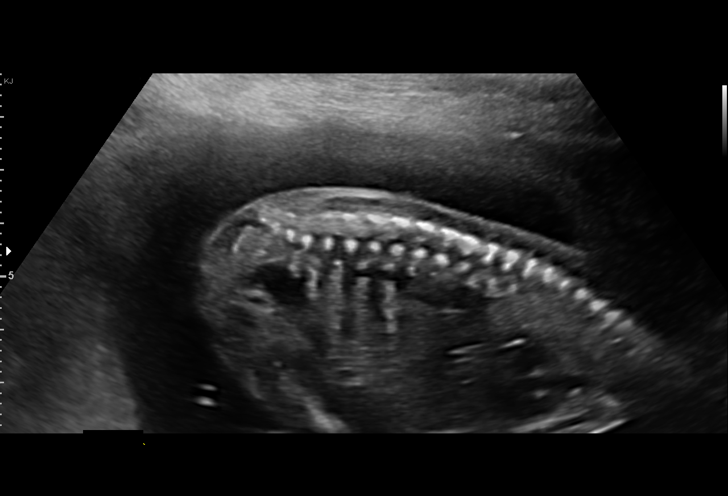
[im 68/102]
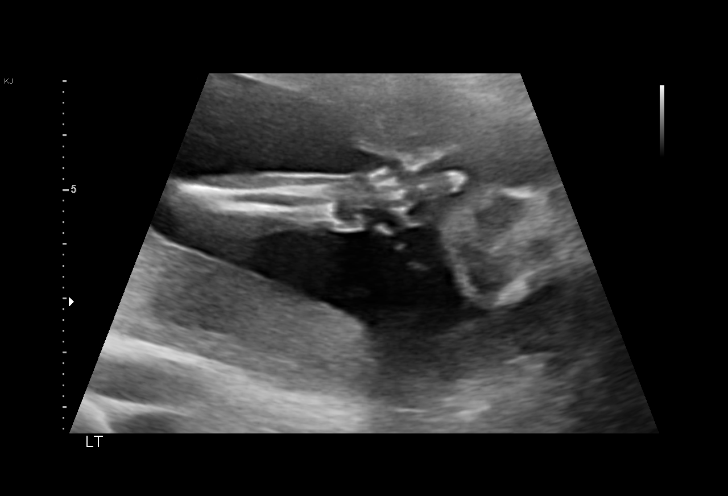
[im 75/102]
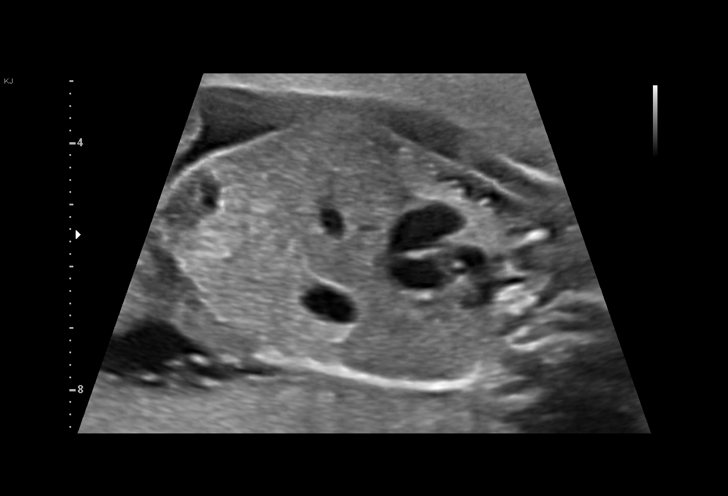
[im 83/102]
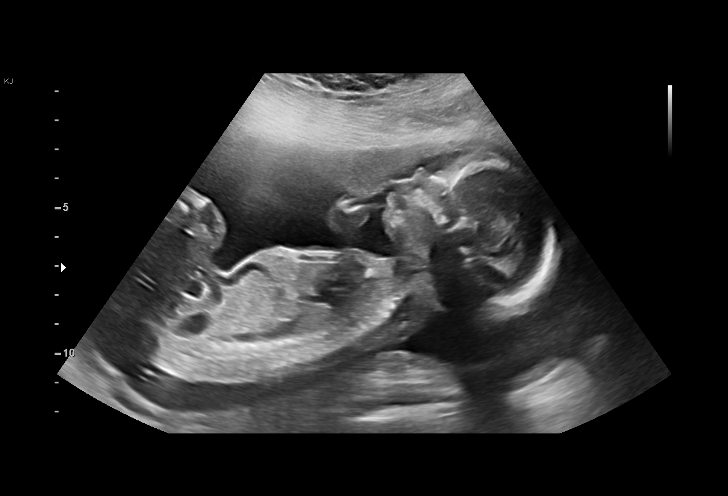
[im 90/102]
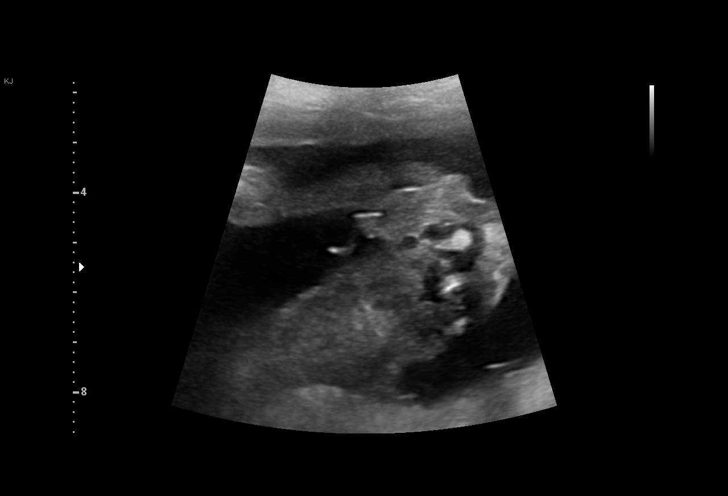
[im 98/102]
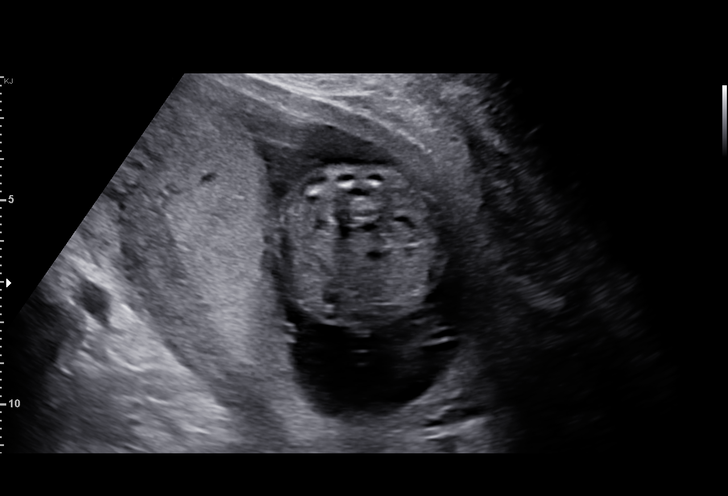

[13 of 28 positions shown; findings below may reference images not displayed]

Indications

 Fetal abnormality - other known or suspected
 Neg NIPS
 19 weeks gestation of pregnancy
Fetal Evaluation

 Num Of Fetuses:         1
 Fetal Heart Rate(bpm):  150
 Cardiac Activity:       Observed
 Presentation:           Cephalic
 Placenta:               Posterior
 P. Cord Insertion:      Visualized, central

 Amniotic Fluid
 AFI FV:      Within normal limits
Biometry

 BPD:      47.4  mm     G. Age:  20w 2d         91  %    CI:        74.73   %    70 - 86
                                                         FL/HC:      16.5   %    16.1 -
 HC:       174   mm     G. Age:  20w 0d         79  %    HC/AC:      1.20        1.09 -
 AC:      145.5  mm     G. Age:  19w 6d         70  %    FL/BPD:     60.5   %
 FL:       28.7  mm     G. Age:  18w 6d         31  %    FL/AC:      19.7   %    20 - 24
 HUM:      29.7  mm     G. Age:  19w 5d         66  %
 CER:      19.8  mm     G. Age:  19w 1d         44  %
 NFT:       4.1  mm

 LV:          5  mm
 CM:        4.6  mm
 Est. FW:     294  gm    0 lb 10 oz      65  %
OB History

 Gravidity:    2         Term:   0        Prem:   0        SAB:   1
 TOP:          0       Ectopic:  0        Living: 0
Gestational Age

 LMP:           19w 1d        Date:  12/24/19                 EDD:   09/29/20
 U/S Today:     19w 5d                                        EDD:   09/25/20
 Best:          19w 1d     Det. By:  LMP  (12/24/19)          EDD:   09/29/20
Anatomy

 Cranium:               Appears normal         LVOT:                   Appears normal
 Cavum:                 Appears normal         Aortic Arch:            Appears normal
 Ventricles:            Appears normal         Ductal Arch:            Appears normal
 Choroid Plexus:        Appears normal         Diaphragm:              Appears normal
 Cerebellum:            Appears normal         Stomach:                Appears normal, left
                                                                       sided
 Posterior Fossa:       Appears normal         Abdomen:                Appears normal
 Nuchal Fold:           Appears normal         Abdominal Wall:         Appears nml (cord
                                                                       insert, abd wall)
 Face:                  Appears normal         Cord Vessels:           Appears normal (3
                        (orbits and profile)                           vessel cord)
 Lips:                  Appears normal         Kidneys:                Appear normal
 Palate:                Not well visualized    Bladder:                Appears normal
 Thoracic:              Appears normal         Spine:                  Appears normal
 Heart:                 Appears normal         Upper Extremities:      Appears normal
                        (4CH, axis, and
                        situs)
 RVOT:                  Appears normal         Lower Extremities:      Appears normal

 Other:  SVC IVC appears normal. 3VV/T appears normal. Parents do not
         wish to know sex of fetus. Fetus appears to be a male. Heels and 5th
         digit visualized. Nasal bone visualized.
Cervix Uterus Adnexa

 Cervix
 Length:           3.39  cm.
 Normal appearance by transabdominal scan.

 Right Ovary
 Appears normal

 Left Ovary
 Appears normal

 Adnexa
 No abnormality visualized.
Comments

 This patient was seen for a detailed fetal anatomy scan.
 She denies any significant past medical history and denies
 any problems in her current pregnancy.
 She had a cell free DNA test earlier in her pregnancy which
 indicated a low risk for trisomy 21, 18, and 13.  The patient
 did not want the fetal gender revealed today.
 She was informed that the fetal growth and amniotic fluid
 level were appropriate for her gestational age.
 There were no obvious fetal anomalies noted on today's
 ultrasound exam.
 The patient was informed that anomalies may be missed due
 to technical limitations. If the fetus is in a suboptimal position
 or maternal habitus is increased, visualization of the fetus in
 the maternal uterus may be impaired.
 Follow up as indicated.

## 2022-10-23 IMAGING — CT CT HEAD W/O CM
4 series · 16 of 47 positions shown, 18 images · non-contrast
Comparison: 10/04/2020

CLINICAL DATA: Worsening headache with chronic subdural hematoma.

EXAM:
CT HEAD WITHOUT CONTRAST
TECHNIQUE: Contiguous axial images were obtained from the base of the skull
through the vertex without intravenous contrast.

[Series 3: head wo · axial · 0.48mm/px · z∈[+1444,+1564]mm · 7 of 32 slices shown, 9 images]
[im 4/32  brain]
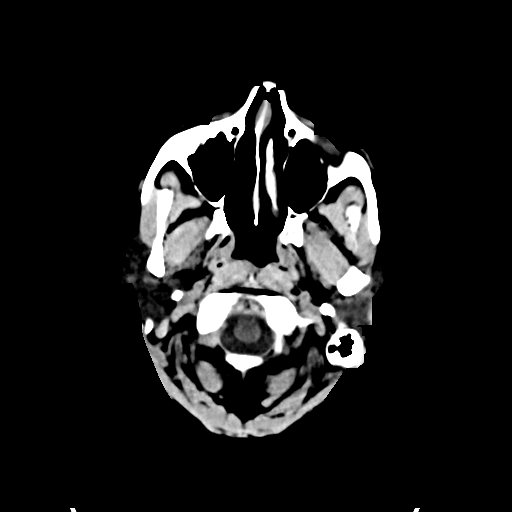
[im 4/32  bone]
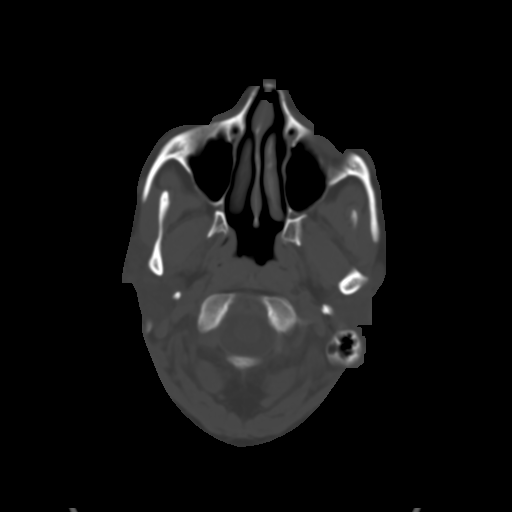
[im 8/32  brain]
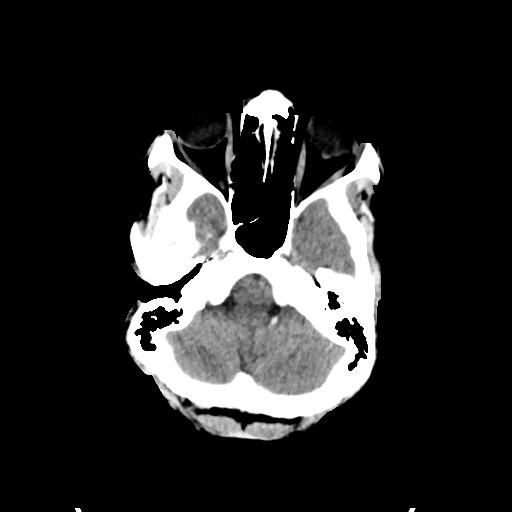
[im 12/32  brain]
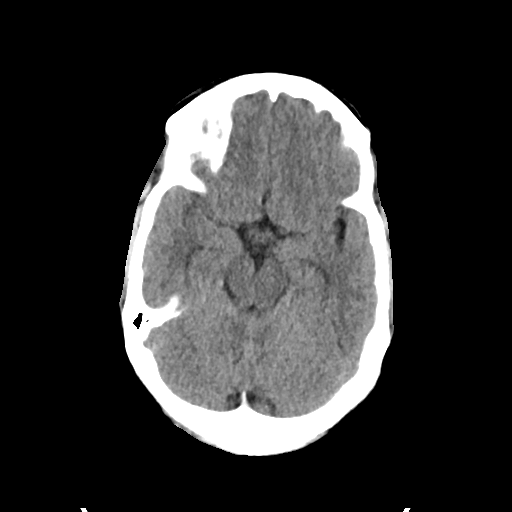
[im 16/32  brain]
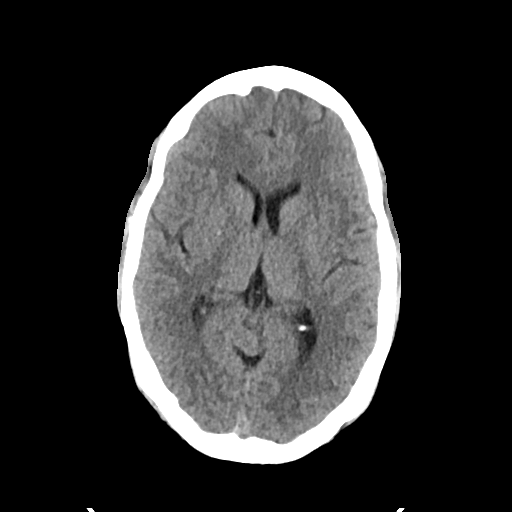
[im 20/32  brain]
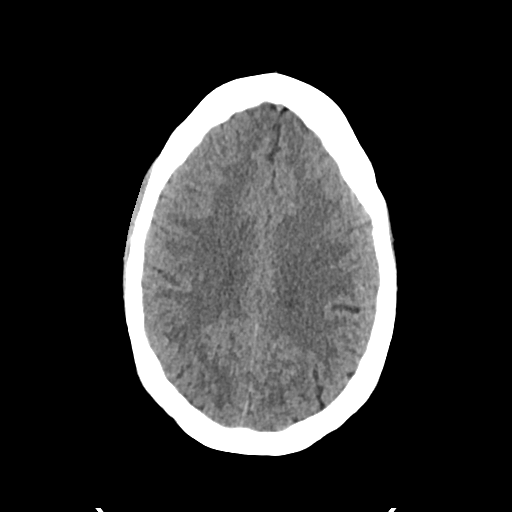
[im 20/32  bone]
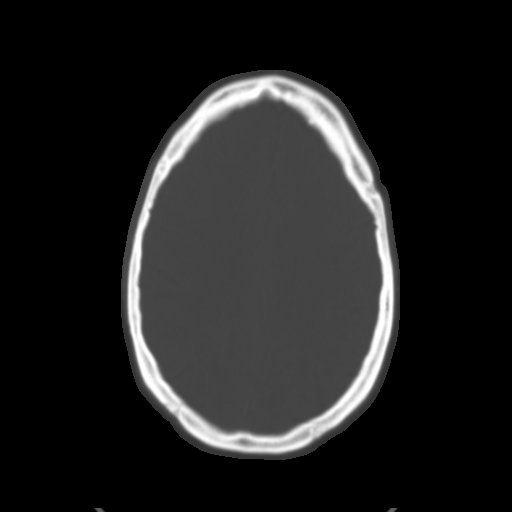
[im 24/32  brain]
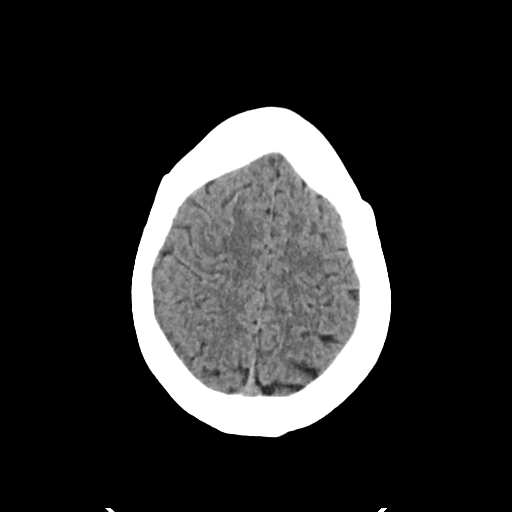
[im 28/32  brain]
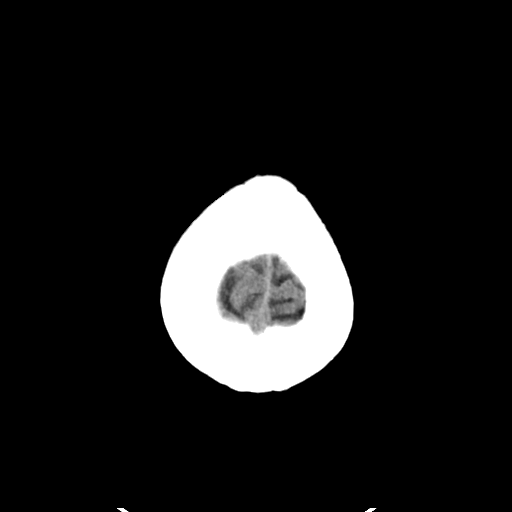

[Series 4: head bone · axial · 0.48mm/px · z∈[+1444,+1476]mm · 3 of 78 slices shown]
[im 8/78  bone]
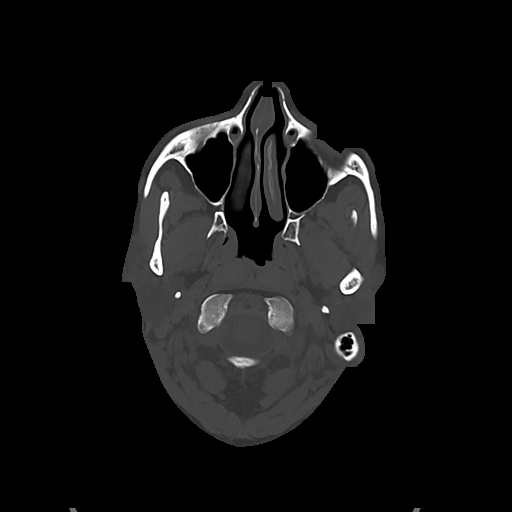
[im 16/78  bone]
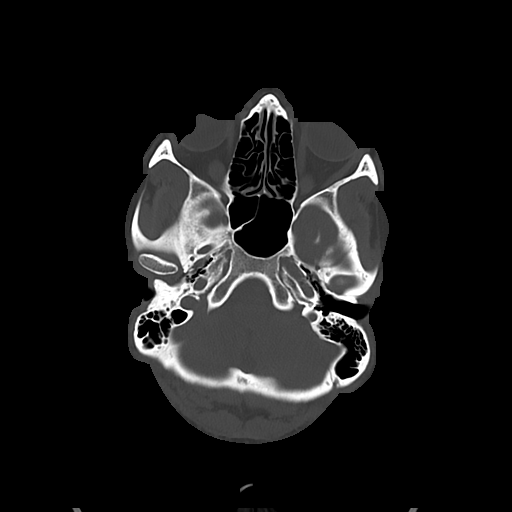
[im 24/78  bone]
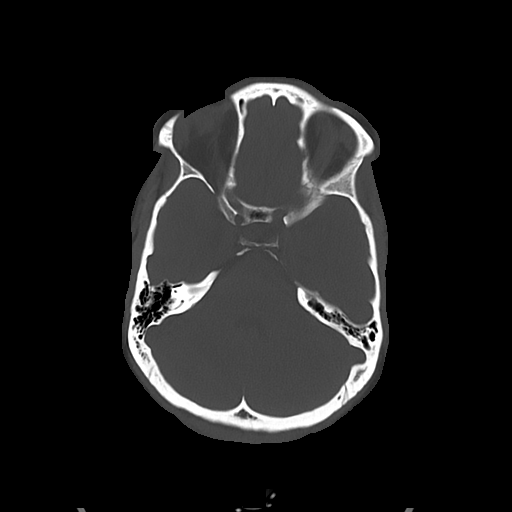

[Series 5: cor soft · coronal · 0.33mm/px · 3 of 72 slices shown]
[im 24/72  brain]
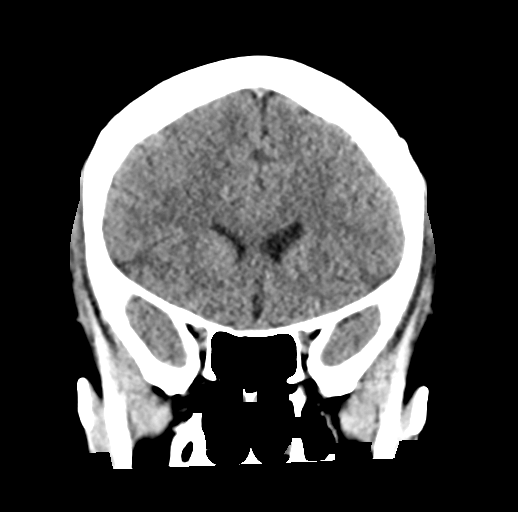
[im 32/72  brain]
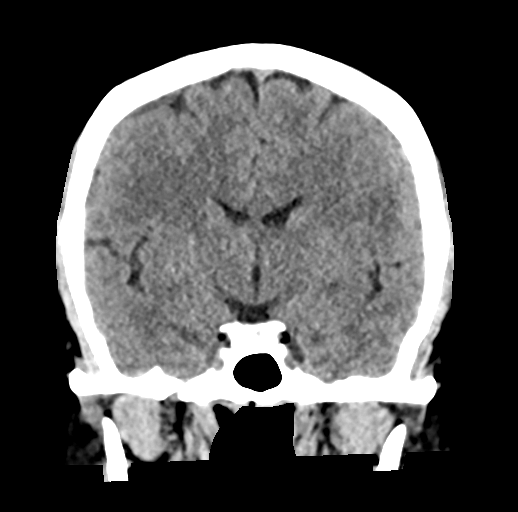
[im 40/72  brain]
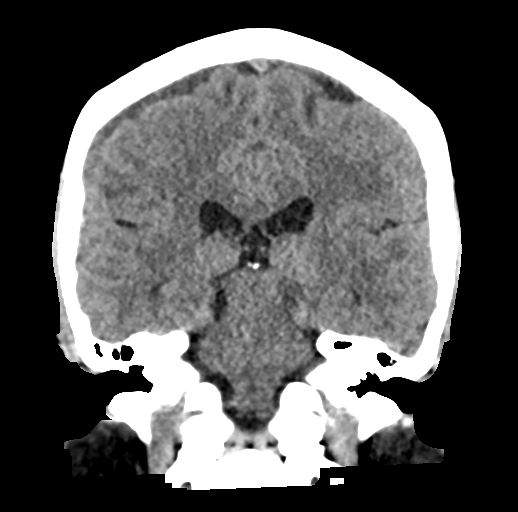

[Series 6: sag soft · sagittal · 0.33mm/px · 3 of 57 slices shown]
[im 19/57  brain]
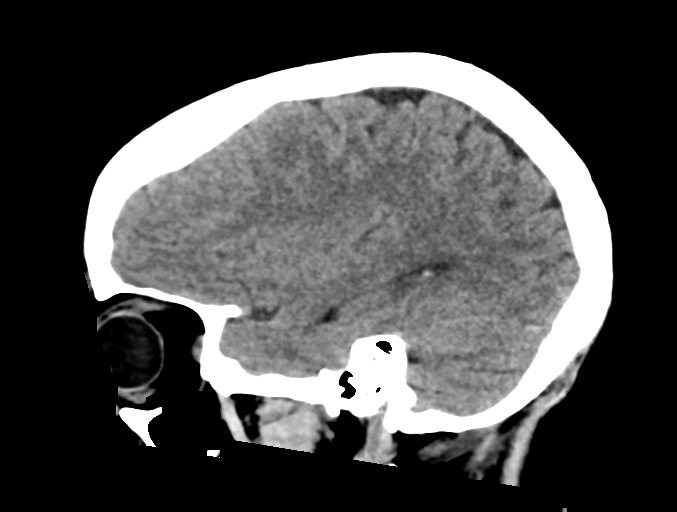
[im 29/57  brain]
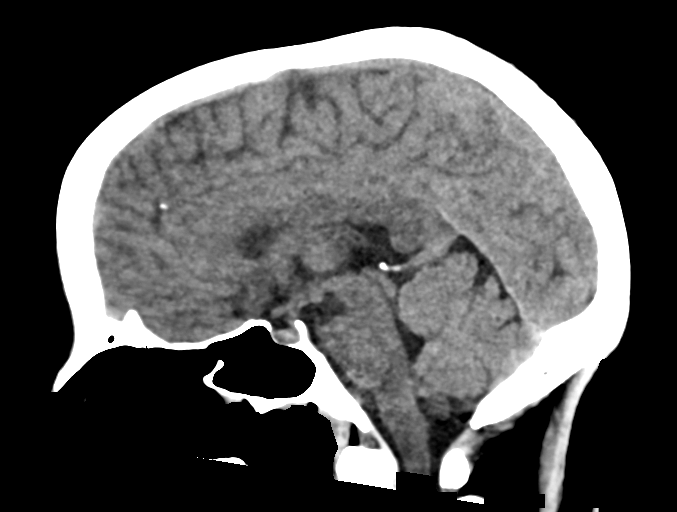
[im 38/57  brain]
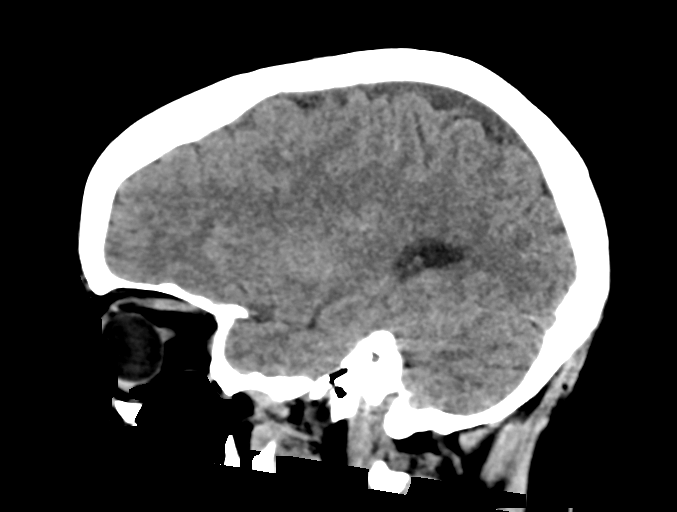

[16 of 47 positions shown; findings below may reference images not displayed]

FINDINGS: Brain: Small chronic subdural collections along the convexities are
unchanged since prior study. No progression or acute hemorrhage. No
significant mass effect or midline shift. Ventricles are not dilated
or effaced. Gray-white matter junctions are intact. Basal cisterns
are not effaced.

Vascular: No hyperdense vessel or unexpected calcification.

Skull: Calvarium appears intact.

Sinuses/Orbits: Paranasal sinuses and mastoid air cells are clear.

Other: None.
IMPRESSION: Small chronic subdural collections along the convexities are
unchanged since prior study. No acute intracranial hemorrhage or
mass effect.

## 2022-11-16 ENCOUNTER — Ambulatory Visit
Payer: No Typology Code available for payment source | Attending: Cardiovascular Disease | Admitting: Cardiovascular Disease

## 2022-11-16 VITALS — BP 129/83 | HR 59 | Ht 65.0 in | Wt 174.4 lb

## 2022-11-16 DIAGNOSIS — R55 Syncope and collapse: Secondary | ICD-10-CM

## 2022-11-16 NOTE — Progress Notes (Signed)
Cardiology Office Note:  .   Date:  11/19/2022  ID:  Brittney Khan, DOB [birth year], MRN [id number] PCP: [facility]  [facility] Providers Cardiologist:  None    History of Present Illness: .   Brittney Khan is a 37 y.o. female referred in consultation for recurrent episodes of near syncope.  Problems with dizziness and nearly passing out have been going on since 2011 when she was about 37 years old.  At that point she remembers suddenly feeling poorly when she stood up from a sitting position, felt extremely thirsty and flushed and passed out while stumbling back to the kitchen.  Later, the symptoms symptoms were particularly bad during her pregnancy, when she had issues with nausea and vomiting.  The problem subsequently improved and last Sunday was the first time in years that she has had an event.  She was standing in the kitchen, preparing a meal when she felt hot, flushed and felt that "everything was getting tight".  She "always knows when it is coming".  And is generally able to avoid passing out by sitting down.  Orthostatic vital signs checked today did not show a drop in blood pressure, but did show a significant increase in heart rate from about 60-85 from lying to standing.  She is not aware of any other family members who have problems with fainting.  ROS: The patient specifically denies any chest pain at rest or with exertion, dyspnea at rest or with exertion, orthopnea, paroxysmal nocturnal dyspnea, , palpitations, focal neurological deficits, intermittent claudication, lower extremity edema, unexplained weight gain, cough, hemoptysis or wheezing.   Studies Reviewed: Marland Kitchen   EKG Interpretation Date/Time:  Tuesday November 16 2022 11:15:40 EDT Ventricular Rate:  59 PR Interval:  124 QRS Duration:  76 QT Interval:  434 QTC Calculation: 429 R Axis:   63  Text Interpretation: Sinus bradycardia Nonspecific T wave abnormality When compared with ECG of  28-Mar-2020 02:00, PREVIOUS ECG IS PRESENT Confirmed by Keatyn Luck [phone]) on 11/16/2022 11:30:54 AM     Risk Assessment/Calculations:          Physical Exam:   VS:  BP 129/83 (BP Location: Left Arm, Patient Position: Sitting, Cuff Size: Normal)   Pulse (!) 59   Ht 5\' 5"  (1.651 m)   Wt 174 lb 6.4 oz (79.1 kg)   SpO2 99%   BMI 29.02 kg/m    Wt Readings from Last 3 Encounters:  11/16/22 174 lb 6.4 oz (79.1 kg)  02/13/22 180 lb (81.6 kg)  10/30/20 176 lb (79.8 kg)    GEN: Well nourished, well developed in no acute distress NECK: No JVD; No carotid bruits CARDIAC: RRR, no murmurs, rubs, gallops RESPIRATORY:  Clear to auscultation without rales, wheezing or rhonchi  ABDOMEN: Soft, non-tender, non-distended EXTREMITIES:  No edema; No deformity   ASSESSMENT AND PLAN: .   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  Discussed trying to identify and avoid triggers if possible (so far she has been unable to figure out why it happens).  Avoid standing without moving for long periods of time.  Avoid excessive exposure to heat.  Avoid diuretics including caffeine and alcohol.  Stay very well-hydrated and eat a diet relatively rich in sodium (but periodically monitor her blood pressure).  Most importantly, if she feels the prodromal symptoms she needs to immediately lay down or crouch to avoid passing out and injuring herself.       Dispo:  Patient  Instructions  Syncope, Adult  Syncope refers to a condition in which a person temporarily loses consciousness. Syncope may also be called fainting or passing out. It is caused by a sudden decrease in blood flow to the brain. This can happen for a variety of reasons. Most causes of syncope are not dangerous. It can be triggered by things such as needle sticks, seeing blood, pain, or intense emotion. However, syncope can also be a sign of a serious medical problem, such as a heart abnormality. Other  causes can include dehydration, migraines, or taking medicines that lower blood pressure. Your health care provider may do tests to find the reason why you are having syncope. If you faint, get medical help right away. Call your local emergency services (911 in the [place] Follow these instructions at home: Pay attention to any changes in your symptoms. Take these actions to stay safe and to help relieve your symptoms: Knowing when you may be about to faint Signs that you may be about to faint include: Feeling dizzy, weak, light-headed, or like the room is spinning. Feeling nauseous. Seeing spots or seeing all white or all black in your field of vision. Having cold, clammy skin or feeling warm and sweaty. Hearing ringing in the ears (tinnitus). If you start to feel like you might faint, sit or lie down right away. If sitting, put your head down between your legs. If lying down, raise (elevate) your feet above the level of your heart. Breathe deeply and steadily. Wait until all the symptoms have passed. Have someone stay with you until you feel stable. Medicines Take over-the-counter and prescription medicines only as told by your health care provider. If you are taking blood pressure or heart medicine, get up slowly and take several minutes to sit and then stand. This can reduce dizziness and decrease the risk of syncope. Lifestyle Do not drive, use machinery, or play sports until your health care provider says it is okay. Do not drink alcohol. Do not use any products that contain nicotine or tobacco. These products include cigarettes, chewing tobacco, and vaping devices, such as e-cigarettes. If you need help quitting, ask your health care provider. Avoid hot tubs and saunas. General instructions Talk with your health care provider about your symptoms. You may need to have testing to understand the cause of your syncope. Drink enough fluid to keep your urine pale yellow. Avoid prolonged  standing. If you must stand for a long time, do movements such as: Moving your legs. Crossing your legs. Flexing and stretching your leg muscles. Squatting. Keep all follow-up visits. This is important. Contact a health care provider if: You have episodes of near fainting. Get help right away if: You faint. You hit your head or are injured after fainting. You have any of these symptoms that may indicate trouble with your heart: Fast or irregular heartbeats (palpitations). Unusual pain in your chest, abdomen, or back. Shortness of breath. You have a seizure. You have a severe headache. You are confused. You have vision problems. You have severe weakness or trouble walking. You are bleeding from your mouth or rectum, or you have black or tarry stool. These symptoms may represent a serious problem that is an emergency. Do not wait to see if your symptoms will go away. Get medical help right away. Call your local emergency services (911 in the [place] Do not drive yourself to the hospital. Summary Syncope refers to a condition in which a person temporarily loses consciousness. Syncope may  also be called fainting or passing out. It is caused by a sudden decrease in blood flow to the brain. Signs that you may be about to faint include dizziness, feeling light-headed, feeling nauseous, sudden vision changes, or cold, clammy skin. Even though most causes of syncope are not dangerous, syncope can be a sign of a serious medical problem. Get help right away if you faint. If you start to feel like you might faint, sit or lie down right away. If sitting, put your head down between your legs. If lying down, raise (elevate) your feet above the level of your heart. This information is not intended to replace advice given to you by your health care provider. Make sure you discuss any questions you have with your health care provider. Medication Instructions:  No changes *If you need a refill on your  cardiac medications before your next appointment, please call your pharmacy*  Follow-Up: At [facility], you and your health needs are our priority.  As part of our continuing mission to provide you with exceptional heart care, we have created designated Provider Care Teams.  These Care Teams include your primary Cardiologist (physician) and Advanced Practice Providers (APPs -  Physician Assistants and Nurse Practitioners) who all work together to provide you with the care you need, when you need it.  We recommend signing up for the patient portal called "MyChart".  Sign up information is provided on this After Visit Summary.  MyChart is used to connect with patients for Virtual Visits (Telemedicine).  Patients are able to view lab/test results, encounter notes, upcoming appointments, etc.  Non-urgent messages can be sent to your provider as well.   To learn more about what you can do with MyChart, go to [email or web address].    Your next appointment:   1 year(s)  Provider:   Dr Royann Shivers      Signed, Thurmon Fair, MD

## 2022-11-16 NOTE — Patient Instructions (Addendum)
Syncope, Adult  Syncope refers to a condition in which a person temporarily loses consciousness. Syncope may also be called fainting or passing out. It is caused by a sudden decrease in blood flow to the brain. This can happen for a variety of reasons. Most causes of syncope are not dangerous. It can be triggered by things such as needle sticks, seeing blood, pain, or intense emotion. However, syncope can also be a sign of a serious medical problem, such as a heart abnormality. Other causes can include dehydration, migraines, or taking medicines that lower blood pressure. Your health care provider may do tests to find the reason why you are having syncope. If you faint, get medical help right away. Call your local emergency services (911 in the [place] Follow these instructions at home: Pay attention to any changes in your symptoms. Take these actions to stay safe and to help relieve your symptoms: Knowing when you may be about to faint Signs that you may be about to faint include: Feeling dizzy, weak, light-headed, or like the room is spinning. Feeling nauseous. Seeing spots or seeing all white or all black in your field of vision. Having cold, clammy skin or feeling warm and sweaty. Hearing ringing in the ears (tinnitus). If you start to feel like you might faint, sit or lie down right away. If sitting, put your head down between your legs. If lying down, raise (elevate) your feet above the level of your heart. Breathe deeply and steadily. Wait until all the symptoms have passed. Have someone stay with you until you feel stable. Medicines Take over-the-counter and prescription medicines only as told by your health care provider. If you are taking blood pressure or heart medicine, get up slowly and take several minutes to sit and then stand. This can reduce dizziness and decrease the risk of syncope. Lifestyle Do not drive, use machinery, or play sports until your health care provider says it is  okay. Do not drink alcohol. Do not use any products that contain nicotine or tobacco. These products include cigarettes, chewing tobacco, and vaping devices, such as e-cigarettes. If you need help quitting, ask your health care provider. Avoid hot tubs and saunas. General instructions Talk with your health care provider about your symptoms. You may need to have testing to understand the cause of your syncope. Drink enough fluid to keep your urine pale yellow. Avoid prolonged standing. If you must stand for a long time, do movements such as: Moving your legs. Crossing your legs. Flexing and stretching your leg muscles. Squatting. Keep all follow-up visits. This is important. Contact a health care provider if: You have episodes of near fainting. Get help right away if: You faint. You hit your head or are injured after fainting. You have any of these symptoms that may indicate trouble with your heart: Fast or irregular heartbeats (palpitations). Unusual pain in your chest, abdomen, or back. Shortness of breath. You have a seizure. You have a severe headache. You are confused. You have vision problems. You have severe weakness or trouble walking. You are bleeding from your mouth or rectum, or you have black or tarry stool. These symptoms may represent a serious problem that is an emergency. Do not wait to see if your symptoms will go away. Get medical help right away. Call your local emergency services (911 in the [place] Do not drive yourself to the hospital. Summary Syncope refers to a condition in which a person temporarily loses consciousness. Syncope may also be called  fainting or passing out. It is caused by a sudden decrease in blood flow to the brain. Signs that you may be about to faint include dizziness, feeling light-headed, feeling nauseous, sudden vision changes, or cold, clammy skin. Even though most causes of syncope are not dangerous, syncope can be a sign of a serious  medical problem. Get help right away if you faint. If you start to feel like you might faint, sit or lie down right away. If sitting, put your head down between your legs. If lying down, raise (elevate) your feet above the level of your heart. This information is not intended to replace advice given to you by your health care provider. Make sure you discuss any questions you have with your health care provider. Medication Instructions:  No changes *If you need a refill on your cardiac medications before your next appointment, please call your pharmacy*  Follow-Up: At [facility], you and your health needs are our priority.  As part of our continuing mission to provide you with exceptional heart care, we have created designated Provider Care Teams.  These Care Teams include your primary Cardiologist (physician) and Advanced Practice Providers (APPs -  Physician Assistants and Nurse Practitioners) who all work together to provide you with the care you need, when you need it.  We recommend signing up for the patient portal called "MyChart".  Sign up information is provided on this After Visit Summary.  MyChart is used to connect with patients for Virtual Visits (Telemedicine).  Patients are able to view lab/test results, encounter notes, upcoming appointments, etc.  Non-urgent messages can be sent to your provider as well.   To learn more about what you can do with MyChart, go to [email or web address].    Your next appointment:   1 year(s)  Provider:   Dr Royann Shivers

## 2022-11-19 ENCOUNTER — Encounter: Payer: Self-pay | Admitting: Cardiovascular Disease

## 2023-04-28 ENCOUNTER — Other Ambulatory Visit: Payer: Self-pay

## 2023-04-28 ENCOUNTER — Emergency Department (HOSPITAL_COMMUNITY)
Admission: EM | Admit: 2023-04-28 | Discharge: 2023-04-28 | Disposition: A | Discharge status: Home / Self Care | Payer: No Typology Code available for payment source | Attending: Emergency Medicine | Admitting: Emergency Medicine

## 2023-04-28 DIAGNOSIS — D72829 Elevated white blood cell count, unspecified: Secondary | ICD-10-CM | POA: Diagnosis not present

## 2023-04-28 DIAGNOSIS — R197 Diarrhea, unspecified: Secondary | ICD-10-CM | POA: Diagnosis not present

## 2023-04-28 DIAGNOSIS — R112 Nausea with vomiting, unspecified: Secondary | ICD-10-CM | POA: Insufficient documentation

## 2023-04-28 DIAGNOSIS — R5383 Other fatigue: Secondary | ICD-10-CM | POA: Diagnosis not present

## 2023-04-28 DIAGNOSIS — Z20822 Contact with and (suspected) exposure to covid-19: Secondary | ICD-10-CM | POA: Diagnosis not present

## 2023-04-28 LAB — COMPREHENSIVE METABOLIC PANEL
ALT: 12 U/L (ref 0–44)
AST: 14 U/L — ABNORMAL LOW (ref 15–41)
Albumin: 3.7 g/dL (ref 3.5–5.0)
Alkaline Phosphatase: 79 U/L (ref 38–126)
Anion gap: 7 (ref 5–15)
BUN: 13 mg/dL (ref 6–20)
CO2: 25 mmol/L (ref 22–32)
Calcium: 9 mg/dL (ref 8.9–10.3)
Chloride: 107 mmol/L (ref 98–111)
Creatinine, Ser: 1.04 mg/dL — ABNORMAL HIGH (ref 0.44–1.00)
GFR, Estimated: >60 mL/min (ref >60)
Glucose, Bld: 107 mg/dL — ABNORMAL HIGH (ref 70–99)
Potassium: 3.5 mmol/L (ref 3.5–5.1)
Sodium: 139 mmol/L (ref 135–145)
Total Bilirubin: 0.4 mg/dL (ref 0.0–1.2)
Total Protein: 6.6 g/dL (ref 6.5–8.1)

## 2023-04-28 LAB — URINALYSIS, ROUTINE W REFLEX MICROSCOPIC
Bilirubin Urine: NEGATIVE
Glucose, UA: NEGATIVE mg/dL
Ketones, ur: NEGATIVE mg/dL
Leukocytes,Ua: NEGATIVE
Nitrite: NEGATIVE
Protein, ur: NEGATIVE mg/dL
Specific Gravity, Urine: 1.024 (ref 1.005–1.030)
pH: 5 (ref 5.0–8.0)

## 2023-04-28 LAB — RESP PANEL BY RT-PCR (RSV, FLU A&B, COVID)  RVPGX2
Influenza A by PCR: NEGATIVE
Influenza B by PCR: NEGATIVE
Resp Syncytial Virus by PCR: NEGATIVE
SARS Coronavirus 2 by RT PCR: NEGATIVE

## 2023-04-28 LAB — CBC
HCT: 41.3 % (ref 36.0–46.0)
Hemoglobin: 13.9 g/dL (ref 12.0–15.0)
MCH: 32.4 pg (ref 26.0–34.0)
MCHC: 33.7 g/dL (ref 30.0–36.0)
MCV: 96.3 fL (ref 80.0–100.0)
Platelets: 282 10*3/uL (ref 150–400)
RBC: 4.29 MIL/uL (ref 3.87–5.11)
RDW: 12.2 % (ref 11.5–15.5)
WBC: 14.6 10*3/uL — ABNORMAL HIGH (ref 4.0–10.5)
nRBC: 0 % (ref 0.0–0.2)

## 2023-04-28 LAB — HCG, SERUM, QUALITATIVE: Preg, Serum: NEGATIVE

## 2023-04-28 LAB — LIPASE, BLOOD: Lipase: 26 U/L (ref 11–51)

## 2023-04-28 MED ORDER — ONDANSETRON 4 MG PO TBDP: 4.0000 mg | ORAL_TABLET | Freq: Three times a day (TID) | ORAL | 0 refills | Status: AC | PRN

## 2023-04-28 MED ORDER — ONDANSETRON 4 MG PO TBDP: 4.0000 mg | ORAL_TABLET | Freq: Three times a day (TID) | ORAL | 0 refills | Status: DC | PRN

## 2023-04-28 MED ORDER — ONDANSETRON 4 MG PO TBDP
4.0000 mg | ORAL_TABLET | Freq: Once | ORAL | Status: AC
  Administered 2023-04-28: 4 mg via ORAL
  Filled 2023-04-28: qty 1

## 2023-04-28 NOTE — ED Provider Notes (Signed)
[place] EMERGENCY DEPARTMENT AT [facility] Provider Note   CSN: [id number] Arrival date & time: 04/28/23  4098     History  Chief Complaint  Patient presents with   Abdominal Pain   Emesis   Nausea   Diarrhea    Brittney Khan is a 38 y.o. female.  The history is provided by the patient and medical records. No language interpreter was used.  Abdominal Pain Pain location:  Generalized Pain quality: aching and cramping   Pain radiates to:  Does not radiate Pain severity:  Mild Onset quality:  Gradual Duration:  1 day Timing:  Intermittent Progression:  Waxing and waning Relieved by:  Nothing Worsened by:  Vomiting Ineffective treatments:  None tried Associated symptoms: chills, diarrhea, fatigue, nausea and vomiting   Associated symptoms: no chest pain, no constipation, no cough, no dysuria, no fever and no shortness of breath   Risk factors: not pregnant   Emesis Associated symptoms: abdominal pain, chills and diarrhea   Associated symptoms: no cough, no fever and no headaches   Diarrhea Associated symptoms: abdominal pain, chills and vomiting   Associated symptoms: no fever and no headaches        Home Medications Prior to Admission medications   Medication Sig Start Date End Date Taking? Authorizing Provider  norethindrone (NORA-BE) 0.35 MG tablet Take 1 tablet by mouth daily. 09/24/22   [provider]  amLODipine (NORVASC) 5 MG tablet Take 1 tablet (5 mg total) by mouth daily. Patient not taking: Reported on 10/02/2020 09/27/20 10/04/20  Littie Deeds, MD      Allergies    Reglan [metoclopramide]    Review of Systems   Review of Systems  Constitutional:  Positive for chills and fatigue. Negative for fever.  HENT:  Negative for congestion.   Respiratory:  Negative for cough, chest tightness and shortness of breath.   Cardiovascular:  Negative for chest pain.  Gastrointestinal:  Positive for abdominal pain, diarrhea, nausea and  vomiting. Negative for constipation.  Genitourinary:  Negative for dysuria, flank pain and frequency.  Musculoskeletal:  Negative for back pain, neck pain and neck stiffness.  Skin:  Negative for rash and wound.  Neurological:  Negative for dizziness, weakness, light-headedness and headaches.  Psychiatric/Behavioral:  Negative for agitation and confusion.   All other systems reviewed and are negative.   Physical Exam Updated Vital Signs BP 106/86 (BP Location: Right Arm)   Pulse 64   Temp 98 F (36.7 C)   Resp 16   Ht 5\' 5"  (1.651 m)   Wt 79.4 kg   LMP 04/11/2023   SpO2 100%   BMI 29.12 kg/m  Physical Exam Vitals and nursing note reviewed.  Constitutional:      General: She is not in acute distress.    Appearance: She is well-developed. She is not ill-appearing, toxic-appearing or diaphoretic.  HENT:     Head: Normocephalic and atraumatic.     Nose: No congestion.     Mouth/Throat:     Mouth: Mucous membranes are moist.     Pharynx: No oropharyngeal exudate or posterior oropharyngeal erythema.  Eyes:     Extraocular Movements: Extraocular movements intact.     Conjunctiva/sclera: Conjunctivae normal.     Pupils: Pupils are equal, round, and reactive to light.  Cardiovascular:     Rate and Rhythm: Normal rate and regular rhythm.     Heart sounds: No murmur heard. Pulmonary:     Effort: Pulmonary effort is normal. No respiratory distress.  Breath sounds: Normal breath sounds. No wheezing, rhonchi or rales.  Chest:     Chest wall: No tenderness.  Abdominal:     General: Abdomen is flat.     Palpations: Abdomen is soft.     Tenderness: There is no abdominal tenderness. There is no guarding or rebound.  Musculoskeletal:        General: No swelling or tenderness.     Cervical back: Neck supple.     Right lower leg: No edema.     Left lower leg: No edema.  Skin:    General: Skin is warm and dry.     Capillary Refill: Capillary refill takes less than 2 seconds.      Findings: No erythema or rash.  Neurological:     General: No focal deficit present.     Mental Status: She is alert.  Psychiatric:        Mood and Affect: Mood normal.     ED Results / Procedures / Treatments   Labs (all labs ordered are listed, but only abnormal results are displayed) Labs Reviewed  COMPREHENSIVE METABOLIC PANEL - Abnormal; Notable for the following components:      Result Value   Glucose, Bld 107 (*)    Creatinine, Ser 1.04 (*)    AST 14 (*)    All other components within normal limits  CBC - Abnormal; Notable for the following components:   WBC 14.6 (*)    All other components within normal limits  URINALYSIS, ROUTINE W REFLEX MICROSCOPIC - Abnormal; Notable for the following components:   APPearance HAZY (*)    Hgb urine dipstick SMALL (*)    Bacteria, UA RARE (*)    All other components within normal limits  RESP PANEL BY RT-PCR (RSV, FLU A&B, COVID)  RVPGX2  LIPASE, BLOOD  HCG, SERUM, QUALITATIVE    EKG None  Radiology No results found.  Procedures Procedures    Medications Ordered in ED Medications  ondansetron (ZOFRAN-ODT) disintegrating tablet 4 mg (4 mg Oral Given 04/28/23 1330)    ED Course/ Medical Decision Making/ [name]                                 Medical Decision Making Amount and/or Complexity of Data Reviewed Labs: ordered.  Risk Prescription drug management.   I personally saw patient in [facility] during triage and will continue the encounter myself.  Brittney Khan is a 38 y.o. female who presents with 1 day of nausea, vomiting, diarrhea, and abdominal cramping.  Patient reports that in the past, she had similar nausea, vomiting, diarrhea when her menstrual cycle began but is not supposed to start her cycle until next week.  Otherwise she does not think she is pregnant and denies any vaginal bleeding or vaginal discharge.  She denies any trauma.  She denies any sick contacts.  Denies any ears, chills, congestion, cough, or  other respiratory difficulties.  She reports a mild abdominal cramping primarily and she is vomiting.  She denies other complaints.   On exam, lungs clear.  Chest nontender.  Abdomen not focally tender.  Bowel sounds are appreciated.  Back and flanks nontender.  Mucous membranes are moist.  Patient resting comfortably.   Clinically I suspect a viral gastroenteritis given the description of symptoms.  As her abdomen is not tender we will hold on advanced imaging initially but we will get some screening labs and give her  some nausea medicine.  Patient agrees with this plan.  1:21 PM Patient had a screening labs we initially discussed and were overall reassuring.  Mild leukocytosis like related to suspected viral illness.  Urinalysis does not show urinary tract infection with no nitrites or leukocytes.  Metabolic panel did not show critical AKI and did not show significant electrolyte disturbance.  LFT not elevated.  She is not pregnant.  Lipase not elevated.  She will get the Zofran and then p.o. challenge.  If she is feeling better, anticipate discharge home with Zofran for suspected viral gastroenteritis causing her symptoms.  2:22 PM Patient feeling better after medications.  Patient is able to tolerate p.o.  She was negative for COVID/flu/RSV.  Patient would like to go now.  Will give prescription for Zofran and she will follow-up with PCP.  She request or concerns nurse return precautions.  Patient discharged in good condition.        Final Clinical Impression(s) / ED Diagnoses Final diagnoses:  Nausea vomiting and diarrhea  Fatigue, unspecified type    Rx / DC Orders ED Discharge Orders          Ordered    ondansetron (ZOFRAN-ODT) 4 MG disintegrating tablet  Every 8 hours PRN,   Status:  Discontinued        04/28/23 1420    ondansetron (ZOFRAN-ODT) 4 MG disintegrating tablet  Every 8 hours PRN        04/28/23 1421           Clinical Impression: 1. Nausea vomiting and  diarrhea   2. Fatigue, unspecified type     Disposition: Discharge  Condition: Good  I have discussed the results, Dx and Tx plan with the pt(& family if present). He/she/they expressed understanding and agree(s) with the plan. Discharge instructions discussed at great length. Strict return precautions discussed and pt &/or family have verbalized understanding of the instructions. No further questions at time of discharge.    Current Discharge Medication List     START taking these medications   Details  ondansetron (ZOFRAN-ODT) 4 MG disintegrating tablet Take 1 tablet (4 mg total) by mouth every 8 (eight) hours as needed for nausea or vomiting. Qty: 20 tablet, Refills: 0        Follow Up: [facility] [address] [place] [address] [phone]     [facility] Emergency Department at [facility] [address] [place] [address] [phone]       Vaun Hyndman, Canary Brim, MD 04/28/23 [phone]

## 2023-04-28 NOTE — Discharge Instructions (Signed)
Your history, exam, workup today are consistent with a viral gastroenteritis causing the nausea, vomiting, and diarrhea.  Your labs did not show critical abnormalities and showed the slight elevation in white blood cell count likely indicative of your inflammatory process and fighting this infection.  Your exam was reassuring and we agreed to hold on advanced imaging such as CT scan today.  After nausea medicine you were able to tolerate eating and drinking and feel you are safe for discharge home.  Please rest and stay hydrated.  If any symptoms change or worsen acutely, please return to the nearest emergency department.

## 2023-04-28 NOTE — ED Provider Triage Note (Signed)
Emergency Medicine Provider Triage Evaluation Note  Rayah Kanode , a 38 y.o. female  was evaluated in triage.  Pt complains of 1 day of abdominal cramping, nausea, vomiting, and diarrhea.  Review of Systems  Positive: Nausea, vomiting, diarrhea, malaise Negative: Urinary changes, constipation, trauma, fevers, headache, chest pain, shortness of breath, back pain  Physical Exam  BP 106/86 (BP Location: Right Arm)   Pulse 64   Temp 98 F (36.7 C)   Resp 16   Ht 5\' 5"  (1.651 m)   Wt 79.4 kg   LMP 04/11/2023   SpO2 100%   BMI 29.12 kg/m  Gen:   Awake, no distress   Resp:  Normal effort, breath sounds bilaterally MSK:   Moves extremities without difficulty , no tenderness on exam with no abdominal or chest tenderness Other:  Patient sitting comfortably with family  Medical Decision Making  Medically screening exam initiated at 8:42 AM.  Appropriate orders placed.  Marilia Hardester was informed that the remainder of the evaluation will be completed by another provider, this initial triage assessment does not replace that evaluation, and the importance of remaining in the ED until their evaluation is complete.  Ryleigh Fradella is a 38 y.o. female who presents with 1 day of nausea, vomiting, diarrhea, and abdominal cramping.  Patient reports that in the past, she had similar nausea, vomiting, diarrhea when her menstrual cycle began but is not supposed to start her cycle until next week.  Otherwise she does not think she is pregnant and denies any vaginal bleeding or vaginal discharge.  She denies any trauma.  She denies any sick contacts.  Denies any ears, chills, congestion, cough, or other respiratory difficulties.  She reports a mild abdominal cramping primarily and she is vomiting.  She denies other complaints.  On exam, lungs clear.  Chest nontender.  Abdomen not focally tender.  Bowel sounds are appreciated.  Back and flanks nontender.  Mucous membranes are moist.  Patient resting  comfortably.  Clinically I suspect a viral gastroenteritis given the description of symptoms.  As her abdomen is not tender we will hold on advanced imaging initially but we will get some screening labs and give her some nausea medicine.  Patient agrees with this plan.  If workup is concerning or symptoms worsen would consider further workup.  Patient will await her further evaluation.      Alise Calais, Canary Brim, MD 04/28/23 [phone]

## 2023-04-28 NOTE — ED Triage Notes (Signed)
Pt. Stated, I started having stomach pain with N/V/D this morning.

## 2023-04-28 NOTE — ED Notes (Signed)
Pt given water, reports improved nausea
